# Patient Record
Sex: Female | Born: 1985 | Race: Black or African American | Hispanic: No | Smoking: Current every day smoker
Health system: Southern US, Community
[De-identification: ages and names within clinical notes are randomized; demographics above are authoritative.]

## PROBLEM LIST (undated history)

## (undated) DIAGNOSIS — N390 Urinary tract infection, site not specified: Secondary | ICD-10-CM

## (undated) DIAGNOSIS — G40909 Epilepsy, unspecified, not intractable, without status epilepticus: Secondary | ICD-10-CM

## (undated) DIAGNOSIS — F1410 Cocaine abuse, uncomplicated: Secondary | ICD-10-CM

## (undated) DIAGNOSIS — F329 Major depressive disorder, single episode, unspecified: Secondary | ICD-10-CM

## (undated) DIAGNOSIS — F32A Depression, unspecified: Secondary | ICD-10-CM

## (undated) HISTORY — PX: HERNIA REPAIR: SHX51

## (undated) HISTORY — DX: Epilepsy, unspecified, not intractable, without status epilepticus: G40.909

---

## 2012-09-06 LAB — CYSTIC FIBROSIS DIAGNOSTIC STUDY: Interpretation-CFDNA:: NEGATIVE

## 2012-09-06 LAB — OB RESULTS CONSOLE VARICELLA ZOSTER ANTIBODY, IGG: Varicella: IMMUNE

## 2012-09-06 LAB — OB RESULTS CONSOLE RUBELLA ANTIBODY, IGM: Rubella: IMMUNE

## 2012-09-06 LAB — OB RESULTS CONSOLE RPR: RPR: NONREACTIVE

## 2012-09-27 ENCOUNTER — Other Ambulatory Visit (HOSPITAL_COMMUNITY)
Admission: RE | Admit: 2012-09-27 | Discharge: 2012-09-27 | Disposition: A | Discharge status: Home / Self Care | Payer: Medicaid Other | Source: Ambulatory Visit | Attending: Obstetrics and Gynecology | Admitting: Obstetrics and Gynecology

## 2012-09-27 DIAGNOSIS — Z113 Encounter for screening for infections with a predominantly sexual mode of transmission: Secondary | ICD-10-CM | POA: Insufficient documentation

## 2012-09-27 DIAGNOSIS — Z01419 Encounter for gynecological examination (general) (routine) without abnormal findings: Secondary | ICD-10-CM | POA: Insufficient documentation

## 2012-09-27 LAB — OB RESULTS CONSOLE GC/CHLAMYDIA
Chlamydia: NEGATIVE
Gonorrhea: NEGATIVE

## 2012-11-24 ENCOUNTER — Encounter: Payer: Self-pay | Admitting: *Deleted

## 2012-11-24 DIAGNOSIS — R569 Unspecified convulsions: Secondary | ICD-10-CM

## 2013-06-03 ENCOUNTER — Encounter (HOSPITAL_COMMUNITY): Payer: Self-pay | Admitting: Emergency Medicine

## 2013-06-03 ENCOUNTER — Emergency Department (HOSPITAL_COMMUNITY)
Admission: EM | Admit: 2013-06-03 | Discharge: 2013-06-04 | Disposition: A | Discharge status: Home / Self Care | Payer: Medicaid Other | Attending: Emergency Medicine | Admitting: Emergency Medicine

## 2013-06-03 DIAGNOSIS — Z3202 Encounter for pregnancy test, result negative: Secondary | ICD-10-CM | POA: Insufficient documentation

## 2013-06-03 DIAGNOSIS — N390 Urinary tract infection, site not specified: Secondary | ICD-10-CM

## 2013-06-03 DIAGNOSIS — Z79899 Other long term (current) drug therapy: Secondary | ICD-10-CM | POA: Insufficient documentation

## 2013-06-03 DIAGNOSIS — Z880 Allergy status to penicillin: Secondary | ICD-10-CM | POA: Insufficient documentation

## 2013-06-03 DIAGNOSIS — M545 Low back pain, unspecified: Secondary | ICD-10-CM | POA: Insufficient documentation

## 2013-06-03 DIAGNOSIS — Z87891 Personal history of nicotine dependence: Secondary | ICD-10-CM | POA: Insufficient documentation

## 2013-06-03 DIAGNOSIS — R112 Nausea with vomiting, unspecified: Secondary | ICD-10-CM

## 2013-06-03 DIAGNOSIS — G40909 Epilepsy, unspecified, not intractable, without status epilepticus: Secondary | ICD-10-CM | POA: Insufficient documentation

## 2013-06-03 LAB — HEPATIC FUNCTION PANEL
ALT: 26 U/L (ref 0–35)
AST: 18 U/L (ref 0–37)
Albumin: 3.7 g/dL (ref 3.5–5.2)
Total Bilirubin: 0.3 mg/dL (ref 0.3–1.2)
Total Protein: 8.1 g/dL (ref 6.0–8.3)

## 2013-06-03 LAB — CBC
HCT: 38.1 % (ref 36.0–46.0)
Hemoglobin: 12.4 g/dL (ref 12.0–15.0)
WBC: 8.8 10*3/uL (ref 4.0–10.5)

## 2013-06-03 LAB — URINE MICROSCOPIC-ADD ON

## 2013-06-03 LAB — URINALYSIS, ROUTINE W REFLEX MICROSCOPIC
Glucose, UA: NEGATIVE mg/dL
Specific Gravity, Urine: >1.03 — ABNORMAL HIGH (ref 1.005–1.030)
Urobilinogen, UA: 0.2 mg/dL (ref 0.0–1.0)

## 2013-06-03 LAB — POCT PREGNANCY, URINE: Preg Test, Ur: NEGATIVE

## 2013-06-03 LAB — BASIC METABOLIC PANEL
Chloride: 104 mEq/L (ref 96–112)
GFR calc Af Amer: 87 mL/min — ABNORMAL LOW (ref >90)
Potassium: 4 mEq/L (ref 3.5–5.1)

## 2013-06-03 MED ORDER — MORPHINE SULFATE 4 MG/ML IJ SOLN
4.0000 mg | Freq: Once | INTRAMUSCULAR | Status: AC
  Administered 2013-06-03: 4 mg via INTRAVENOUS
  Filled 2013-06-03: qty 1

## 2013-06-03 MED ORDER — SODIUM CHLORIDE 0.9 % IV BOLUS (SEPSIS)
1000.0000 mL | Freq: Once | INTRAVENOUS | Status: DC
Start: 2013-06-03 — End: 2013-06-04

## 2013-06-03 MED ORDER — SODIUM CHLORIDE 0.9 % IV BOLUS (SEPSIS)
1000.0000 mL | Freq: Once | INTRAVENOUS | Status: AC
  Administered 2013-06-03: 1000 mL via INTRAVENOUS

## 2013-06-03 MED ORDER — ONDANSETRON HCL 4 MG/2ML IJ SOLN
4.0000 mg | Freq: Once | INTRAMUSCULAR | Status: AC
  Administered 2013-06-03: 4 mg via INTRAVENOUS
  Filled 2013-06-03: qty 2

## 2013-06-03 NOTE — ED Notes (Signed)
Advised patient that we needed a urine sample. Patient states "I went before I got back here." Patient refuses to try again for a sample.

## 2013-06-03 NOTE — ED Notes (Signed)
Patient ambulated to bathroom to obtain urine sample. Patient came out stating "I forgot you wanted a sample so I forgot to put the cup down there. I will try again in a few minutes." MD aware.

## 2013-06-03 NOTE — ED Notes (Signed)
Pt states lower back pain. Vomiting all day, states "I wasn't counting. I'm sure I am dehydrated.

## 2013-06-03 NOTE — ED Provider Notes (Signed)
CSN: [id number]     Arrival date & time 06/03/13  1625 History   First MD Initiated Contact with Patient 06/03/13 1921    Scribed for No att. providers found, the patient was seen in room APA12/APA12. This chart was scribed by Lewanda Rife, ED scribe. Patient's care was started at 11:39 PM  Chief Complaint  Patient presents with  . Back Pain  . Emesis   (Consider location/radiation/quality/duration/timing/severity/associated sxs/prior Treatment) The history is provided by the patient. No language interpreter was used.   HPI Comments: Lamika Connolly is a 27 y.o. female who presents to the Emergency Department complaining of several episodes emesis onset this morning. Reports associated low back pain, nausea, and upper abdominal pain. Describes pain as constant and moderate in severity. Denies any aggravating or alleviating factors. Denies associated fever, dysuria, diarrhea, vaginal discharge, and vaginal bleeding. Reports unsure if pregnant. Denies sick contacts. Past Medical History  Diagnosis Date  . Epilepsy    Past Surgical History  Procedure Laterality Date  . Hernia repair      umbilical hernia repair x 2.   Family History  Problem Relation Age of Onset  . Hypertension Other   . Diabetes Other   . Epilepsy Other    History  Substance Use Topics  . Smoking status: Former Smoker    Types: Cigarettes  . Smokeless tobacco: Not on file  . Alcohol Use: Yes     Comment: occ. none since +pregnancy test.   OB History   Grav Para Term Preterm Abortions TAB SAB Ect Mult Living   3 2 2             Review of Systems  Constitutional: Negative for fever.  Gastrointestinal: Positive for nausea, vomiting and abdominal pain. Negative for diarrhea.  Musculoskeletal: Positive for back pain.  All other systems reviewed and are negative.  A complete 10 system review of systems was obtained and all systems are negative except as noted in the HPI and PMHx.     Allergies   Penicillins  Home Medications   Current Outpatient Rx  Name  Route  Sig  Dispense  Refill  . levETIRAcetam (KEPPRA) 500 MG tablet   Oral   Take 500 mg by mouth 2 (two) times daily.         Marland Kitchen oxyCODONE-acetaminophen (PERCOCET/ROXICET) 5-325 MG per tablet   Oral   Take 1-2 tablets by mouth every 6 (six) hours as needed for pain.   10 tablet   0   . promethazine (PHENERGAN) 25 MG tablet   Oral   Take 1 tablet (25 mg total) by mouth every 6 (six) hours as needed for nausea.   10 tablet   0   . sulfamethoxazole-trimethoprim (SEPTRA DS) 800-160 MG per tablet   Oral   Take 1 tablet by mouth 2 (two) times daily.   6 tablet   0    BP 110/63  Pulse 65  Temp(Src) 98.1 F (36.7 C) (Oral)  Resp 16  Ht 5\' 11"  (1.803 m)  Wt 274 lb (124.286 kg)  BMI 38.23 kg/m2  SpO2 98%  LMP 06/03/2013  Breastfeeding? Unknown Physical Exam  Nursing note and vitals reviewed. Constitutional: She is oriented to person, place, and time. She appears well-developed and well-nourished. No distress.  Obese   HENT:  Head: Normocephalic and atraumatic.  Eyes: Conjunctivae and EOM are normal.  Neck: Neck supple. No tracheal deviation present.  Cardiovascular: Normal rate and regular rhythm.   No murmur heard. Pulmonary/Chest:  Effort normal and breath sounds normal. No respiratory distress.  Abdominal: Soft. There is tenderness.  TTP Upper abdomen No CVA tenderness   Musculoskeletal: Normal range of motion.  Neurological: She is alert and oriented to person, place, and time.  Skin: Skin is warm and dry.  Psychiatric: She has a normal mood and affect. Her behavior is normal.    ED Course  Procedures (including critical care time)  COORDINATION OF CARE:  Nursing notes reviewed. Vital signs reviewed. Initial pt interview and examination performed.   11:39 PM-Discussed work up plan with pt at bedside, which includes BMP, CBC, UA, and POCT pregnancy  . Pt agrees with plan.   Treatment plan  initiated: Medications  ondansetron [facility]) injection 4 mg (4 mg Intravenous Given 06/03/13 1958)  sodium chloride 0.9 % bolus 1,000 mL (0 mLs Intravenous Stopped 06/04/13 0050)  ondansetron (ZOFRAN) injection 4 mg (4 mg Intravenous Given 06/03/13 2141)  morphine 4 MG/ML injection 4 mg (4 mg Intravenous Given 06/03/13 2141)     Initial diagnostic testing ordered.    Labs Review Labs Reviewed  BASIC METABOLIC PANEL - Abnormal; Notable for the following:    Glucose, Bld 125 (*)    GFR calc non Af Amer 75 (*)    GFR calc Af Amer 87 (*)    All other components within normal limits  URINALYSIS, ROUTINE W REFLEX MICROSCOPIC - Abnormal; Notable for the following:    Specific Gravity, Urine >1.030 (*)    Hgb urine dipstick LARGE (*)    Ketones, ur 15 (*)    All other components within normal limits  HEPATIC FUNCTION PANEL - Abnormal; Notable for the following:    Indirect Bilirubin 0.2 (*)    All other components within normal limits  URINE MICROSCOPIC-ADD ON - Abnormal; Notable for the following:    Squamous Epithelial / LPF MANY (*)    Bacteria, UA MANY (*)    All other components within normal limits  URINE CULTURE  CBC  LIPASE, BLOOD  POCT PREGNANCY, URINE   Imaging Review No results found.  EKG Interpretation   None       MDM   1. UTI (urinary tract infection)   2. Nausea and vomiting    Patient with nausea vomiting abdominal pain. Has UTI. Patient feels somewhat better after treatment. Has tolerated orals he'll be discharged home  I personally performed the services described in this documentation, which was scribed in my presence. The recorded information has been reviewed and is accurate.      Juliet Rude. Rubin Payor, MD 06/04/13 2340

## 2013-06-04 MED ORDER — OXYCODONE-ACETAMINOPHEN 5-325 MG PO TABS: 1.0000 | ORAL_TABLET | Freq: Four times a day (QID) | ORAL | Status: DC | PRN

## 2013-06-04 MED ORDER — SULFAMETHOXAZOLE-TRIMETHOPRIM 800-160 MG PO TABS: 1.0000 | ORAL_TABLET | Freq: Two times a day (BID) | ORAL | Status: DC

## 2013-06-04 MED ORDER — PROMETHAZINE HCL 25 MG PO TABS: 25.0000 mg | ORAL_TABLET | Freq: Four times a day (QID) | ORAL | Status: DC | PRN

## 2013-06-05 LAB — URINE CULTURE: Colony Count: NO GROWTH

## 2014-01-09 ENCOUNTER — Encounter (HOSPITAL_COMMUNITY): Payer: Self-pay | Admitting: Emergency Medicine

## 2014-01-09 ENCOUNTER — Emergency Department (HOSPITAL_COMMUNITY)
Admission: EM | Admit: 2014-01-09 | Discharge: 2014-01-09 | Disposition: A | Discharge status: Home / Self Care | Payer: Medicaid Other | Attending: Emergency Medicine | Admitting: Emergency Medicine

## 2014-01-09 DIAGNOSIS — R350 Frequency of micturition: Secondary | ICD-10-CM | POA: Insufficient documentation

## 2014-01-09 DIAGNOSIS — R300 Dysuria: Secondary | ICD-10-CM | POA: Insufficient documentation

## 2014-01-09 DIAGNOSIS — F3289 Other specified depressive episodes: Secondary | ICD-10-CM | POA: Insufficient documentation

## 2014-01-09 DIAGNOSIS — G40909 Epilepsy, unspecified, not intractable, without status epilepticus: Secondary | ICD-10-CM | POA: Insufficient documentation

## 2014-01-09 DIAGNOSIS — R109 Unspecified abdominal pain: Secondary | ICD-10-CM | POA: Insufficient documentation

## 2014-01-09 DIAGNOSIS — Z3202 Encounter for pregnancy test, result negative: Secondary | ICD-10-CM | POA: Insufficient documentation

## 2014-01-09 DIAGNOSIS — Z87891 Personal history of nicotine dependence: Secondary | ICD-10-CM | POA: Insufficient documentation

## 2014-01-09 DIAGNOSIS — M545 Low back pain, unspecified: Secondary | ICD-10-CM | POA: Insufficient documentation

## 2014-01-09 DIAGNOSIS — F329 Major depressive disorder, single episode, unspecified: Secondary | ICD-10-CM | POA: Insufficient documentation

## 2014-01-09 DIAGNOSIS — Z79899 Other long term (current) drug therapy: Secondary | ICD-10-CM | POA: Insufficient documentation

## 2014-01-09 DIAGNOSIS — Z9889 Other specified postprocedural states: Secondary | ICD-10-CM | POA: Insufficient documentation

## 2014-01-09 DIAGNOSIS — Z880 Allergy status to penicillin: Secondary | ICD-10-CM | POA: Insufficient documentation

## 2014-01-09 HISTORY — DX: Depression, unspecified: F32.A

## 2014-01-09 HISTORY — DX: Major depressive disorder, single episode, unspecified: F32.9

## 2014-01-09 LAB — COMPREHENSIVE METABOLIC PANEL
ALT: 8 U/L (ref 0–35)
AST: 11 U/L (ref 0–37)
Albumin: 3.4 g/dL — ABNORMAL LOW (ref 3.5–5.2)
Alkaline Phosphatase: 70 U/L (ref 39–117)
BILIRUBIN TOTAL: 0.4 mg/dL (ref 0.3–1.2)
BUN: 5 mg/dL — ABNORMAL LOW (ref 6–23)
CHLORIDE: 106 meq/L (ref 96–112)
CO2: 24 meq/L (ref 19–32)
CREATININE: 0.94 mg/dL (ref 0.50–1.10)
Calcium: 9 mg/dL (ref 8.4–10.5)
GFR, EST NON AFRICAN AMERICAN: 82 mL/min — AB (ref >90)
GLUCOSE: 114 mg/dL — AB (ref 70–99)
Potassium: 3.7 mEq/L (ref 3.7–5.3)
Sodium: 141 mEq/L (ref 137–147)
Total Protein: 7.1 g/dL (ref 6.0–8.3)

## 2014-01-09 LAB — URINALYSIS, ROUTINE W REFLEX MICROSCOPIC
Glucose, UA: NEGATIVE mg/dL
HGB URINE DIPSTICK: NEGATIVE
KETONES UR: 15 mg/dL — AB
Nitrite: NEGATIVE
PROTEIN: NEGATIVE mg/dL
Specific Gravity, Urine: 1.025 (ref 1.005–1.030)
Urobilinogen, UA: 2 mg/dL — ABNORMAL HIGH (ref 0.0–1.0)
pH: 6.5 (ref 5.0–8.0)

## 2014-01-09 LAB — POC URINE PREG, ED: Preg Test, Ur: NEGATIVE

## 2014-01-09 LAB — URINE MICROSCOPIC-ADD ON

## 2014-01-09 LAB — CBC WITH DIFFERENTIAL/PLATELET
BASOS ABS: 0 10*3/uL (ref 0.0–0.1)
Basophils Relative: 1 % (ref 0–1)
Eosinophils Absolute: 0.1 10*3/uL (ref 0.0–0.7)
Eosinophils Relative: 2 % (ref 0–5)
HEMATOCRIT: 36.5 % (ref 36.0–46.0)
HEMOGLOBIN: 11.9 g/dL — AB (ref 12.0–15.0)
LYMPHS PCT: 30 % (ref 12–46)
Lymphs Abs: 1.8 10*3/uL (ref 0.7–4.0)
MCH: 28.9 pg (ref 26.0–34.0)
MCHC: 32.6 g/dL (ref 30.0–36.0)
MCV: 88.6 fL (ref 78.0–100.0)
MONO ABS: 0.2 10*3/uL (ref 0.1–1.0)
Monocytes Relative: 3 % (ref 3–12)
NEUTROS ABS: 4 10*3/uL (ref 1.7–7.7)
Neutrophils Relative %: 66 % (ref 43–77)
Platelets: 211 10*3/uL (ref 150–400)
RBC: 4.12 MIL/uL (ref 3.87–5.11)
RDW: 13.1 % (ref 11.5–15.5)
WBC: 6 10*3/uL (ref 4.0–10.5)

## 2014-01-09 LAB — HCG, SERUM, QUALITATIVE: Preg, Serum: NEGATIVE

## 2014-01-09 MED ORDER — SULFAMETHOXAZOLE-TRIMETHOPRIM 800-160 MG PO TABS: 1.0000 | ORAL_TABLET | Freq: Two times a day (BID) | ORAL | Status: DC

## 2014-01-09 MED ORDER — PHENAZOPYRIDINE HCL 200 MG PO TABS: 200.0000 mg | ORAL_TABLET | Freq: Three times a day (TID) | ORAL | Status: DC

## 2014-01-09 NOTE — Discharge Instructions (Signed)
Dysuria Dysuria is the medical term for pain with urination. There are many causes for dysuria, but urinary tract infection is the most common. If a urinalysis was performed it can show that there is a urinary tract infection. A urine culture confirms that you or your child is sick. You will need to follow up with a healthcare provider because:  If a urine culture was done you will need to know the culture results and treatment recommendations.  If the urine culture was positive, you or your child will need to be put on antibiotics or know if the antibiotics prescribed are the right antibiotics for your urinary tract infection.  If the urine culture is negative (no urinary tract infection), then other causes may need to be explored or antibiotics need to be stopped. Today laboratory work may have been done and there does not seem to be an infection. If cultures were done they will take at least 24 to 48 hours to be completed. Today x-rays may have been taken and they read as normal. No cause can be found for the problems. The x-rays may be re-read by a radiologist and you will be contacted if additional findings are made. You or your child may have been put on medications to help with this problem until you can see your primary caregiver. If the problems get better, see your primary caregiver if the problems return. If you were given antibiotics (medications which kill germs), take all of the mediations as directed for the full course of treatment.  If laboratory work was done, you need to find the results. Leave a telephone number where you can be reached. If this is not possible, make sure you find out how you are to get test results. HOME CARE INSTRUCTIONS   Drink lots of fluids. For adults, drink eight, 8 ounce glasses of clear juice or water a day. For children, replace fluids as suggested by your caregiver.  Empty the bladder often. Avoid holding urine for long periods of time.  After a bowel  movement, women should cleanse front to back, using each tissue only once.  Empty your bladder before and after sexual intercourse.  Take all the medicine given to you until it is gone. You may feel better in a few days, but TAKE ALL MEDICINE.  Avoid caffeine, tea, alcohol and carbonated beverages, because they tend to irritate the bladder.  In men, alcohol may irritate the prostate.  Only take over-the-counter or prescription medicines for pain, discomfort, or fever as directed by your caregiver.  If your caregiver has given you a follow-up appointment, it is very important to keep that appointment. Not keeping the appointment could result in a chronic or permanent injury, pain, and disability. If there is any problem keeping the appointment, you must call back to this facility for assistance. SEEK IMMEDIATE MEDICAL CARE IF:   Back pain develops.  A fever develops.  There is nausea (feeling sick to your stomach) or vomiting (throwing up).  Problems are no better with medications or are getting worse. MAKE SURE YOU:   Understand these instructions.  Will watch your condition.  Will get help right away if you are not doing well or get worse. Document Released: 05/08/2004 Document Revised: 11/02/2011 Document Reviewed: 03/15/2008 [facility] Patient Information 2014 [place], [place].   Your lab tests are negative today for urinary infection and you are not pregnant.  See the resource guide above for locating a primary doctor.  You may take the pyridium  prescription for dysuria.  Your urine sample has been sent for culture as there is not clear infection in your bladder.  However,  I will cover you with antibiotics given your symptoms.  Make sure you are drinking plenty of fluids.

## 2014-01-09 NOTE — ED Provider Notes (Signed)
CSN: [id number]     Arrival date & time 01/09/14  1331 History   First MD Initiated Contact with Patient 01/09/14 1438     Chief Complaint  Patient presents with  . Possible Pregnancy     (Consider location/radiation/quality/duration/timing/severity/associated sxs/prior Treatment) HPI Comments: Chelsey Sullivan is a 28 y.o. Female who is currently 10 months post partum presenting with complaint of lower back and suprapubic pain along with increased urinary frequency.  She took a home pregnancy test x 2 last night, both being positive. Her LMP was 12/22/13 and normal.  She denies breast tenderness, nausea, vomiting, fevers, diarrhea and also denies painful urination.  She is not currently breast feeding.  She denies vaginal discharge and is not currently using birth control.       The history is provided by the patient.    Past Medical History  Diagnosis Date  . Epilepsy   . Depression    Past Surgical History  Procedure Laterality Date  . Hernia repair      umbilical hernia repair x 2.   Family History  Problem Relation Age of Onset  . Hypertension Other   . Diabetes Other   . Epilepsy Other    History  Substance Use Topics  . Smoking status: Former Smoker    Types: Cigarettes  . Smokeless tobacco: Not on file  . Alcohol Use: Yes     Comment: occ. none since +pregnancy test.   OB History   Grav Para Term Preterm Abortions TAB SAB Ect Mult Living   3 2 2             Review of Systems  Constitutional: Negative for fever and chills.  HENT: Negative for congestion and sore throat.   Eyes: Negative.   Respiratory: Negative for chest tightness and shortness of breath.   Cardiovascular: Negative for chest pain.  Gastrointestinal: Negative for nausea, vomiting, abdominal pain, diarrhea and rectal pain.  Genitourinary: Positive for frequency. Negative for dysuria, hematuria and vaginal discharge.  Musculoskeletal: Positive for back pain. Negative for arthralgias, joint swelling  and neck pain.  Skin: Negative.  Negative for rash and wound.  Neurological: Negative for dizziness, weakness, light-headedness, numbness and headaches.  Psychiatric/Behavioral: Negative.       Allergies  Amoxicillin and Penicillins  Home Medications   Prior to Admission medications   Medication Sig Start Date End Date Taking? Authorizing Provider  citalopram (CELEXA) 20 MG tablet Take 20 mg by mouth daily.   Yes Historical Provider, MD  levETIRAcetam (KEPPRA) 500 MG tablet Take 500 mg by mouth 2 (two) times daily.   Yes Historical Provider, MD  ranitidine (ZANTAC) 150 MG capsule Take 150 mg by mouth daily.   Yes Historical Provider, MD   BP 114/68  Pulse 62  Temp(Src) 97 F (36.1 C) (Oral)  Resp 16  Wt 280 lb (127.007 kg)  SpO2 100%  LMP 12/22/2013 Physical Exam  Nursing note and vitals reviewed. Constitutional: She appears well-developed and well-nourished. No distress.  HENT:  Head: Normocephalic and atraumatic.  Eyes: Conjunctivae are normal.  Neck: Normal range of motion.  Cardiovascular: Normal rate, regular rhythm, normal heart sounds and intact distal pulses.   Pulmonary/Chest: Effort normal and breath sounds normal. She has no wheezes.  Abdominal: Soft. Bowel sounds are normal. She exhibits no mass. There is no hepatosplenomegaly. There is no tenderness. There is no rigidity, no rebound, no guarding and no CVA tenderness.  Obese abdomen.  Musculoskeletal: Normal range of motion.  Lumbar back: She exhibits tenderness. She exhibits no bony tenderness.  ttp bilateral lower back.  Neurological: She is alert.  Skin: Skin is warm and dry. She is not diaphoretic.  Psychiatric: Her affect is blunt.    ED Course  Procedures (including critical care time) Labs Review Labs Reviewed  CBC WITH DIFFERENTIAL - Abnormal; Notable for the following:    Hemoglobin 11.9 (*)    All other components within normal limits  COMPREHENSIVE METABOLIC PANEL - Abnormal; Notable  for the following:    Glucose, Bld 114 (*)    BUN 5 (*)    Albumin 3.4 (*)    GFR calc non Af Amer 82 (*)    All other components within normal limits  URINALYSIS, ROUTINE W REFLEX MICROSCOPIC - Abnormal; Notable for the following:    Color, Urine AMBER (*)    APPearance CLOUDY (*)    Bilirubin Urine SMALL (*)    Ketones, ur 15 (*)    Urobilinogen, UA 2.0 (*)    Leukocytes, UA SMALL (*)    All other components within normal limits  URINE MICROSCOPIC-ADD ON - Abnormal; Notable for the following:    Squamous Epithelial / LPF MANY (*)    Bacteria, UA MANY (*)    All other components within normal limits  HCG, SERUM, QUALITATIVE  POC URINE PREG, ED    Imaging Review No results found.   EKG Interpretation None      MDM   Final diagnoses:  Dysuria    Patients labs and/or radiological studies were viewed and considered during the medical decision making and disposition process. Pt with dysuria with bacturia.  Not clean catch,  But with sx,  Will tx.  Urine cx sent.  Bactrim,  Pyridium,  Fluids.  Return here for worsened sx, fever, pain, vomiting, etc.    The patient appears reasonably screened and/or stabilized for discharge and I doubt any other medical condition or other [facility] requiring further screening, evaluation, or treatment in the ED at this time prior to discharge.     Burgess AmorJulie Dimitrios Balestrieri, PA-C 01/09/14 1824

## 2014-01-09 NOTE — ED Notes (Signed)
Pt reports she took a home pregnancy test twice and it was positive, sts she is here for another pregnancy test because she wants to make sure it is still positive. Denies seeking pre-natal care/seeing an OB. Has been pregnant 3 times before this. Pt reports chronic back pain, sts over the past 2 weeks it has flared up and been hurting. Denies abd pain/n/v/d. Nad, skin warm and dry, resp e/u.

## 2014-01-09 NOTE — ED Notes (Signed)
She took 2 positive pregnancy tests at home. She states her stomach and back have been hurting. She wants to make sure shes really pregnant

## 2014-01-11 NOTE — ED Provider Notes (Signed)
Medical screening examination/treatment/procedure(s) were performed by non-physician practitioner and as supervising physician I was immediately available for consultation/collaboration.      Byren Pankow M Milanya Sunderland, DO 01/11/14 2138 

## 2014-03-01 ENCOUNTER — Emergency Department (HOSPITAL_COMMUNITY)
Admission: EM | Admit: 2014-03-01 | Discharge: 2014-03-01 | Disposition: A | Discharge status: Home / Self Care | Payer: Medicaid Other | Attending: Emergency Medicine | Admitting: Emergency Medicine

## 2014-03-01 ENCOUNTER — Encounter (HOSPITAL_COMMUNITY): Payer: Self-pay | Admitting: Emergency Medicine

## 2014-03-01 DIAGNOSIS — Z880 Allergy status to penicillin: Secondary | ICD-10-CM | POA: Diagnosis not present

## 2014-03-01 DIAGNOSIS — F329 Major depressive disorder, single episode, unspecified: Secondary | ICD-10-CM | POA: Diagnosis not present

## 2014-03-01 DIAGNOSIS — M79609 Pain in unspecified limb: Secondary | ICD-10-CM | POA: Insufficient documentation

## 2014-03-01 DIAGNOSIS — Z87891 Personal history of nicotine dependence: Secondary | ICD-10-CM | POA: Diagnosis not present

## 2014-03-01 DIAGNOSIS — M79604 Pain in right leg: Secondary | ICD-10-CM

## 2014-03-01 DIAGNOSIS — G40909 Epilepsy, unspecified, not intractable, without status epilepticus: Secondary | ICD-10-CM | POA: Diagnosis not present

## 2014-03-01 DIAGNOSIS — Z87828 Personal history of other (healed) physical injury and trauma: Secondary | ICD-10-CM | POA: Diagnosis not present

## 2014-03-01 DIAGNOSIS — Z79899 Other long term (current) drug therapy: Secondary | ICD-10-CM | POA: Insufficient documentation

## 2014-03-01 DIAGNOSIS — F3289 Other specified depressive episodes: Secondary | ICD-10-CM | POA: Diagnosis not present

## 2014-03-01 DIAGNOSIS — Z792 Long term (current) use of antibiotics: Secondary | ICD-10-CM | POA: Diagnosis not present

## 2014-03-01 MED ORDER — TRAMADOL HCL 50 MG PO TABS
50.0000 mg | ORAL_TABLET | Freq: Once | ORAL | Status: AC
  Administered 2014-03-01: 50 mg via ORAL
  Filled 2014-03-01: qty 1

## 2014-03-01 MED ORDER — TRAMADOL HCL 50 MG PO TABS: 50.0000 mg | ORAL_TABLET | Freq: Four times a day (QID) | ORAL | Status: DC | PRN

## 2014-03-01 NOTE — Discharge Instructions (Signed)

## 2014-03-01 NOTE — ED Notes (Signed)
Patient discharged with all personal belongings. 
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
## 2014-03-06 NOTE — ED Provider Notes (Signed)
Medical screening examination/treatment/procedure(s) were performed by non-physician practitioner and as supervising physician I was immediately available for consultation/collaboration.   EKG Interpretation None      Devoria AlbeIva Intisar Claudio, MD, Armando GangFACEP   Ward GivensIva L Trayveon Beckford, MD 03/06/14 [phone]

## 2014-06-25 ENCOUNTER — Encounter (HOSPITAL_COMMUNITY): Payer: Self-pay | Admitting: Emergency Medicine

## 2014-08-15 ENCOUNTER — Encounter (HOSPITAL_COMMUNITY): Payer: Self-pay | Admitting: *Deleted

## 2014-08-15 ENCOUNTER — Emergency Department (HOSPITAL_COMMUNITY): Payer: Medicaid Other

## 2014-08-15 ENCOUNTER — Emergency Department (HOSPITAL_COMMUNITY)
Admission: EM | Admit: 2014-08-15 | Discharge: 2014-08-16 | Disposition: A | Discharge status: Home / Self Care | Payer: Medicaid Other | Attending: Emergency Medicine | Admitting: Emergency Medicine

## 2014-08-15 DIAGNOSIS — O9989 Other specified diseases and conditions complicating pregnancy, childbirth and the puerperium: Secondary | ICD-10-CM | POA: Diagnosis present

## 2014-08-15 DIAGNOSIS — O99341 Other mental disorders complicating pregnancy, first trimester: Secondary | ICD-10-CM | POA: Diagnosis not present

## 2014-08-15 DIAGNOSIS — Z880 Allergy status to penicillin: Secondary | ICD-10-CM | POA: Insufficient documentation

## 2014-08-15 DIAGNOSIS — Z79899 Other long term (current) drug therapy: Secondary | ICD-10-CM | POA: Diagnosis not present

## 2014-08-15 DIAGNOSIS — O210 Mild hyperemesis gravidarum: Secondary | ICD-10-CM | POA: Insufficient documentation

## 2014-08-15 DIAGNOSIS — N390 Urinary tract infection, site not specified: Secondary | ICD-10-CM

## 2014-08-15 DIAGNOSIS — O99351 Diseases of the nervous system complicating pregnancy, first trimester: Secondary | ICD-10-CM | POA: Diagnosis not present

## 2014-08-15 DIAGNOSIS — O2341 Unspecified infection of urinary tract in pregnancy, first trimester: Secondary | ICD-10-CM | POA: Insufficient documentation

## 2014-08-15 DIAGNOSIS — R109 Unspecified abdominal pain: Secondary | ICD-10-CM

## 2014-08-15 DIAGNOSIS — F329 Major depressive disorder, single episode, unspecified: Secondary | ICD-10-CM | POA: Diagnosis not present

## 2014-08-15 DIAGNOSIS — Z3A01 Less than 8 weeks gestation of pregnancy: Secondary | ICD-10-CM | POA: Insufficient documentation

## 2014-08-15 DIAGNOSIS — G40909 Epilepsy, unspecified, not intractable, without status epilepticus: Secondary | ICD-10-CM | POA: Insufficient documentation

## 2014-08-15 DIAGNOSIS — Z3490 Encounter for supervision of normal pregnancy, unspecified, unspecified trimester: Secondary | ICD-10-CM

## 2014-08-15 DIAGNOSIS — Z87891 Personal history of nicotine dependence: Secondary | ICD-10-CM | POA: Insufficient documentation

## 2014-08-15 LAB — I-STAT BETA HCG BLOOD, ED (MC, WL, AP ONLY): I-stat hCG, quantitative: >2000 m[IU]/mL — ABNORMAL HIGH (ref <5)

## 2014-08-15 LAB — URINALYSIS, ROUTINE W REFLEX MICROSCOPIC
GLUCOSE, UA: NEGATIVE mg/dL
HGB URINE DIPSTICK: NEGATIVE
Ketones, ur: 15 mg/dL — AB
Nitrite: NEGATIVE
Protein, ur: 30 mg/dL — AB
SPECIFIC GRAVITY, URINE: 1.033 — AB (ref 1.005–1.030)
Urobilinogen, UA: 4 mg/dL — ABNORMAL HIGH (ref 0.0–1.0)
pH: 6 (ref 5.0–8.0)

## 2014-08-15 LAB — URINE MICROSCOPIC-ADD ON

## 2014-08-15 LAB — ABO/RH: ABO/RH(D): B POS

## 2014-08-15 LAB — PREGNANCY, URINE: PREG TEST UR: POSITIVE — AB

## 2014-08-15 MED ORDER — ACETAMINOPHEN 325 MG PO TABS
650.0000 mg | ORAL_TABLET | Freq: Once | ORAL | Status: AC
  Administered 2014-08-15: 650 mg via ORAL
  Filled 2014-08-15: qty 2

## 2014-08-15 MED ORDER — CEPHALEXIN 500 MG PO CAPS: 500.0000 mg | ORAL_CAPSULE | Freq: Four times a day (QID) | ORAL | Status: DC

## 2014-08-15 MED ORDER — PRENATAL COMPLETE 14-0.4 MG PO TABS: 1.0000 | ORAL_TABLET | Freq: Every day | ORAL | Status: DC

## 2014-08-15 NOTE — ED Notes (Signed)
Pt is aware urine is needed for testing, pt is unable to urinate at this time, stated she had already used the bathroom.

## 2014-08-15 NOTE — ED Notes (Signed)
Pt in c/o lower back pain and a headache, states she took a home pregnancy test yesterday and it was positive, no distress noted

## 2014-08-15 NOTE — ED Notes (Signed)
Pt states her LMP was 07/06/14

## 2014-08-15 NOTE — ED Provider Notes (Signed)
CSN: [id number]     Arrival date & time 08/15/14  1755 History   First MD Initiated Contact with Patient 08/15/14 1921     Chief Complaint  Patient presents with  . Back Pain     Patient is a 28 y.o. female presenting with back pain. The history is provided by the patient.  Back Pain Location:  Lumbar spine Quality:  Aching Radiates to:  Does not radiate Pain severity:  Mild Onset quality:  Gradual Duration:  1 day Timing:  Intermittent Progression:  Unchanged Chronicity:  New Relieved by:  None tried Worsened by:  Nothing tried Associated symptoms: abdominal pain   Associated symptoms: no fever   pt reports mild low back pain and mild abdominal pain for past day No fever but she report vomiting No vag bleeding/discharge She reports after having nausea/HA yesterday she took a home pregnancy test that was positive LMP one month ago  Past Medical History  Diagnosis Date  . Epilepsy   . Depression    Past Surgical History  Procedure Laterality Date  . Hernia repair      umbilical hernia repair x 2.   Family History  Problem Relation Age of Onset  . Hypertension Other   . Diabetes Other   . Epilepsy Other    History  Substance Use Topics  . Smoking status: Former Smoker    Types: Cigarettes  . Smokeless tobacco: Not on file  . Alcohol Use: Yes     Comment: occ. none since +pregnancy test.   OB History    Gravida Para Term Preterm AB TAB SAB Ectopic Multiple Living   3 2 2             Review of Systems  Constitutional: Negative for fever.  Gastrointestinal: Positive for vomiting and abdominal pain.  Genitourinary: Negative for vaginal bleeding and vaginal discharge.  Musculoskeletal: Positive for back pain.  All other systems reviewed and are negative.     Allergies  Amoxicillin and Penicillins  Home Medications   Prior to Admission medications   Medication Sig Start Date End Date Taking? Authorizing Provider  acetaminophen (TYLENOL) 500 MG tablet  Take 1,000 mg by mouth every 6 (six) hours as needed for headache.   Yes Historical Provider, MD  albuterol (PROVENTIL HFA;VENTOLIN HFA) 108 (90 BASE) MCG/ACT inhaler Inhale 2 puffs into the lungs every 6 (six) hours as needed for wheezing or shortness of breath.   Yes Historical Provider, MD  citalopram (CELEXA) 20 MG tablet Take 20 mg by mouth daily.    Historical Provider, MD  levETIRAcetam (KEPPRA) 500 MG tablet Take 500 mg by mouth 2 (two) times daily.    Historical Provider, MD   BP 113/45 mmHg  Pulse 53  Temp(Src) 98.2 F (36.8 C)  Resp 16  Ht 5\' 6"  (1.676 m)  Wt 210 lb (95.255 kg)  BMI 33.91 kg/m2  SpO2 97% Physical Exam CONSTITUTIONAL: Well developed/well nourished HEAD: Normocephalic/atraumatic EYES: EOMI/PERRL ENMT: Mucous membranes moist NECK: supple no meningeal signs SPINE/BACK:entire spine nontender CV: S1/S2 noted, no murmurs/rubs/gallops noted LUNGS: Lungs are clear to auscultation bilaterally, no apparent distress ABDOMEN: soft, nontender, no rebound or guarding, bowel sounds noted throughout abdomen GU:no cva tenderness, no cmt.  Small amt of whitish discharge.  No vag bleeding.  No adnexal tenderness/mass.  Female chaperone present NEURO: Pt is awake/alert/appropriate, moves all extremitiesx4.  No facial droop.   EXTREMITIES: pulses normal/equal, full ROM SKIN: warm, color normal PSYCH: no abnormalities of mood noted, alert  and oriented to situation  ED Course  Procedures   9:14 PM Pt found to be pregnant She reports back/abd pain She is in no distress US imaging ordered.  (bedside US was limited) She will need treatment for UTI. She needs to f/u with OBGYN concerning her daily medication (keppra/celexa)  11:18 PM US reveals early pregnancy (yolk sac noted) Pt is well appearing, no distress Will f/u as outpatient with OBGYN in one week We discussed strict return precautions Will start keflex for possible UTI (culture ordered) She reports only rash to  PCN no other symptoms will dose with keflex Labs Review Labs Reviewed  URINALYSIS, ROUTINE W REFLEX MICROSCOPIC - Abnormal; Notable for the following:    Color, Urine AMBER (*)    APPearance TURBID (*)    Specific Gravity, Urine 1.033 (*)    Bilirubin Urine SMALL (*)    Ketones, ur 15 (*)    Protein, ur 30 (*)    Urobilinogen, UA 4.0 (*)    Leukocytes, UA MODERATE (*)    All other components within normal limits  PREGNANCY, URINE - Abnormal; Notable for the following:    Preg Test, Ur POSITIVE (*)    All other components within normal limits  URINE MICROSCOPIC-ADD ON - Abnormal; Notable for the following:    Squamous Epithelial / LPF MANY (*)    Bacteria, UA MANY (*)    All other components within normal limits  I-STAT BETA HCG BLOOD, ED ([facility], [facility], [facility] ONLY) - Abnormal; Notable for the following:    I-stat hCG, quantitative >2000.0 (*)    All other components within normal limits  URINE CULTURE  ABO/RH    Imaging Review [place] Ob Comp Less 14 Wks  08/15/2014   CLINICAL DATA:  Abdominal pain. Gestational age by last menstrual period 5 weeks and 5 days.  EXAM: OBSTETRIC <14 WK US AND TRANSVAGINAL OB US  TECHNIQUE: Both transabdominal and transvaginal ultrasound examinations were performed for complete evaluation of the gestation as well as the maternal uterus, adnexal regions, and pelvic cul-de-sac. Transvaginal technique was performed to assess early pregnancy.  COMPARISON:  None.  FINDINGS: Intrauterine gestational sac: Apparent gestational sac, normal in shape.  Yolk sac:  Present  Embryo:  Not present  Cardiac Activity: Not present  MSD:  12.8  mm   6 w   1  d ; EDD April 09, 2015  Maternal uterus/adnexae: No subchorionic hemorrhage. Nonvisualized adnexae, likely obscured by bowel gas. No free fluid.  IMPRESSION: Apparent early intrauterine gestational and yolk sac without fetal pole, or cardiac activity yet visualized. Recommend follow-up quantitative B-HCG levels and follow-up US in 14  days to confirm and assess viability. This recommendation follows SRU consensus guidelines: Diagnostic Criteria for Nonviable Pregnancy Early in the First Trimester. Malva Limes Engl J Med 2013; 045:4098-11; 369:1443-51.   Electronically Signed   By: Awilda Metroourtnay  Bloomer   On: 08/15/2014 22:54   [place] Ob Transvaginal  08/15/2014   CLINICAL DATA:  Abdominal pain. Gestational age by last menstrual period 5 weeks and 5 days.  EXAM: OBSTETRIC <14 WK US AND TRANSVAGINAL OB US  TECHNIQUE: Both transabdominal and transvaginal ultrasound examinations were performed for complete evaluation of the gestation as well as the maternal uterus, adnexal regions, and pelvic cul-de-sac. Transvaginal technique was performed to assess early pregnancy.  COMPARISON:  None.  FINDINGS: Intrauterine gestational sac: Apparent gestational sac, normal in shape.  Yolk sac:  Present  Embryo:  Not present  Cardiac Activity: Not present  MSD:  12.8  mm   6 w   1  d ; EDD April 09, 2015  Maternal uterus/adnexae: No subchorionic hemorrhage. Nonvisualized adnexae, likely obscured by bowel gas. No free fluid.  IMPRESSION: Apparent early intrauterine gestational and yolk sac without fetal pole, or cardiac activity yet visualized. Recommend follow-up quantitative B-HCG levels and follow-up US in 14 days to confirm and assess viability. This recommendation follows SRU consensus guidelines: Diagnostic Criteria for Nonviable Pregnancy Early in the First Trimester. Malva Limes Engl J Med 2013; 960:4540-98; 369:1443-51.   Electronically Signed   By: Awilda Metroourtnay  Bloomer   On: 08/15/2014 22:54      MDM   Final diagnoses:  Abdominal pain  Pregnancy  UTI (lower urinary tract infection)    Nursing notes including past medical history and social history reviewed and considered in documentation Labs/vital reviewed myself and considered during evaluation     Joya Gaskinsonald W Marieta Markov, MD 08/15/14 2320

## 2014-08-17 LAB — URINE CULTURE: Colony Count: >=100000

## 2015-12-09 ENCOUNTER — Emergency Department (HOSPITAL_COMMUNITY)
Admission: EM | Admit: 2015-12-09 | Discharge: 2015-12-09 | Disposition: A | Discharge status: Home / Self Care | Payer: Medicaid Other | Attending: Emergency Medicine | Admitting: Emergency Medicine

## 2015-12-09 ENCOUNTER — Encounter (HOSPITAL_COMMUNITY): Payer: Self-pay | Admitting: Emergency Medicine

## 2015-12-09 DIAGNOSIS — F329 Major depressive disorder, single episode, unspecified: Secondary | ICD-10-CM | POA: Diagnosis not present

## 2015-12-09 DIAGNOSIS — Z79899 Other long term (current) drug therapy: Secondary | ICD-10-CM | POA: Insufficient documentation

## 2015-12-09 DIAGNOSIS — N390 Urinary tract infection, site not specified: Secondary | ICD-10-CM | POA: Insufficient documentation

## 2015-12-09 DIAGNOSIS — M545 Low back pain: Secondary | ICD-10-CM | POA: Diagnosis present

## 2015-12-09 LAB — URINALYSIS, ROUTINE W REFLEX MICROSCOPIC
BILIRUBIN URINE: NEGATIVE
Glucose, UA: NEGATIVE mg/dL
KETONES UR: NEGATIVE mg/dL
NITRITE: NEGATIVE
Protein, ur: 30 mg/dL — AB
Specific Gravity, Urine: 1.025 (ref 1.005–1.030)
pH: 6 (ref 5.0–8.0)

## 2015-12-09 LAB — URINE MICROSCOPIC-ADD ON

## 2015-12-09 LAB — POC URINE PREG, ED: Preg Test, Ur: NEGATIVE

## 2015-12-09 MED ORDER — NAPROXEN 500 MG PO TABS: 500.0000 mg | ORAL_TABLET | Freq: Two times a day (BID) | ORAL | Status: DC

## 2015-12-09 MED ORDER — NITROFURANTOIN MONOHYD MACRO 100 MG PO CAPS: 100.0000 mg | ORAL_CAPSULE | Freq: Two times a day (BID) | ORAL | Status: DC

## 2015-12-09 MED ORDER — NITROFURANTOIN MONOHYD MACRO 100 MG PO CAPS
100.0000 mg | ORAL_CAPSULE | Freq: Two times a day (BID) | ORAL | Status: DC
  Administered 2015-12-09: 100 mg via ORAL
  Filled 2015-12-09: qty 1

## 2015-12-09 MED ORDER — PHENAZOPYRIDINE HCL 200 MG PO TABS: 200.0000 mg | ORAL_TABLET | Freq: Three times a day (TID) | ORAL | Status: DC

## 2015-12-09 NOTE — ED Provider Notes (Signed)
CSN: [id number]     Arrival date & time 12/09/15  1317 History   First MD Initiated Contact with Patient 12/09/15 1436     Chief Complaint  Patient presents with  . Back Pain     (Consider location/radiation/quality/duration/timing/severity/associated sxs/prior Treatment) HPI  Chelsey Sullivan is a 30 y.o. female who presents to the Emergency Department complaining of low back pain for 2 days.  She describes an aching pain to her lower back that is constant and unaffected by movement.  She denies known injury.  She also notes having urinary frequency.  She has taken tylenol without relief.  She denies pain, numbness or weakness of the lower extremities, fever, chills, abdominal pain, urine or bowel changes, vaginal pain or vaginal bleeding.    Past Medical History  Diagnosis Date  . Epilepsy (HCC)   . Depression    Past Surgical History  Procedure Laterality Date  . Hernia repair      umbilical hernia repair x 2.   Family History  Problem Relation Age of Onset  . Hypertension Other   . Diabetes Other   . Epilepsy Other    Social History  Substance Use Topics  . Smoking status: Former Smoker    Types: Cigarettes  . Smokeless tobacco: None  . Alcohol Use: Yes     Comment: occ. none since +pregnancy test.   OB History    Gravida Para Term Preterm AB TAB SAB Ectopic Multiple Living   3 2 2             Review of Systems  Constitutional: Negative for fever.  Respiratory: Negative for shortness of breath.   Gastrointestinal: Negative for vomiting, abdominal pain and constipation.  Genitourinary: Positive for frequency. Negative for dysuria, hematuria, flank pain, decreased urine volume, vaginal bleeding, vaginal discharge and difficulty urinating.  Musculoskeletal: Positive for back pain. Negative for joint swelling.  Skin: Negative for rash.  Neurological: Negative for weakness and numbness.  All other systems reviewed and are negative.     Allergies  Amoxicillin and  Penicillins  Home Medications   Prior to Admission medications   Medication Sig Start Date End Date Taking? Authorizing Provider  acetaminophen (TYLENOL) 500 MG tablet Take 1,000 mg by mouth every 6 (six) hours as needed for headache.   Yes Historical Provider, MD  albuterol (PROVENTIL HFA;VENTOLIN HFA) 108 (90 BASE) MCG/ACT inhaler Inhale 2 puffs into the lungs every 6 (six) hours as needed for wheezing or shortness of breath.   Yes Historical Provider, MD  citalopram (CELEXA) 20 MG tablet Take 20 mg by mouth daily.   Yes Historical Provider, MD  levETIRAcetam (KEPPRA) 500 MG tablet Take 500 mg by mouth 2 (two) times daily.   Yes Historical Provider, MD   BP 108/70 mmHg  Pulse 73  Temp(Src) 98.9 F (37.2 C) (Oral)  Resp 16  Wt 210 lb (95.255 kg)  SpO2 99%  LMP 12/03/2015 Physical Exam  Constitutional: She is oriented to person, place, and time. She appears well-developed and well-nourished. No distress.  HENT:  Head: Normocephalic and atraumatic.  Neck: Normal range of motion. Neck supple.  Cardiovascular: Normal rate, regular rhythm, normal heart sounds and intact distal pulses.   No murmur heard. Pulmonary/Chest: Effort normal and breath sounds normal. No respiratory distress.  Abdominal: Soft. Normal appearance. She exhibits no distension. There is no tenderness. There is no CVA tenderness.  Musculoskeletal: She exhibits tenderness. She exhibits no edema.       Lumbar back: She exhibits  tenderness and pain. She exhibits normal range of motion, no swelling, no deformity, no laceration and normal pulse.  ttp of the right lumbar paraspinal muscles.  No spinal tenderness.  DP pulses are brisk and symmetrical.  Distal sensation intact.  Pt has 5/5 strength against resistance of bilateral lower extremities.     Neurological: She is alert and oriented to person, place, and time. She has normal strength. No sensory deficit. She exhibits normal muscle tone. Coordination and gait normal.   Reflex Scores:      Patellar reflexes are 2+ on the right side and 2+ on the left side.      Achilles reflexes are 2+ on the right side and 2+ on the left side. Skin: Skin is warm and dry. No rash noted.  Nursing note and vitals reviewed.   ED Course  Procedures (including critical care time) Labs Review Labs Reviewed  URINE CULTURE - Abnormal; Notable for the following:    Culture >=100,000 COLONIES/mL GRAM NEGATIVE RODS (*)    All other components within normal limits  URINALYSIS, ROUTINE W REFLEX MICROSCOPIC (NOT AT [facility]) - Abnormal; Notable for the following:    APPearance HAZY (*)    Hgb urine dipstick SMALL (*)    Protein, ur 30 (*)    Leukocytes, UA MODERATE (*)    All other components within normal limits  URINE MICROSCOPIC-ADD ON - Abnormal; Notable for the following:    Squamous Epithelial / LPF 6-30 (*)    Bacteria, UA FEW (*)    All other components within normal limits  POC URINE PREG, ED    Imaging Review No results found. I have personally reviewed and evaluated these images and lab results as part of my medical decision-making.   EKG Interpretation None      MDM   Final diagnoses:  UTI (urinary tract infection), uncomplicated    Pt is well appearing, talking on cell phone and texting during my exam.  Non-toxic appearing.  U/A suggests UTI.  No CVA tenderness, fever vomiting to suggest pyelonephritis.  Urine culture pending.  Rx for macrobid pt advised to return here if sx's worsening.  Appears stable for d/c    Pauline Aus, PA-C 12/11/15 2237  Eber Hong, MD 12/12/15 [phone]

## 2015-12-09 NOTE — ED Notes (Signed)
Having back pain for last 2 days.  Rates pain 8/10.

## 2015-12-09 NOTE — Discharge Instructions (Signed)

## 2015-12-12 LAB — URINE CULTURE

## 2015-12-13 ENCOUNTER — Telehealth: Payer: Self-pay

## 2015-12-13 NOTE — Telephone Encounter (Signed)
Post ED Visit - Positive Culture Follow-up  Culture report reviewed by antimicrobial stewardship pharmacist:  []  Enzo BiNathan Batchelder, Pharm.D. []  Celedonio MiyamotoJeremy Frens, Pharm.D., BCPS []  Garvin FilaMike Maccia, Pharm.D. []  Georgina PillionElizabeth Martin, Pharm.D., BCPS []  AltamontMinh Pham, [address]Pharm[address]D., BCPS, AAHIVP []  Estella HuskMichelle Turner, Pharm.D., BCPS, AAHIVP []  Tennis Mustassie Stewart, Pharm.D. []  Rob Oswaldo DoneVincent, [address]Ph[address] Megan MIlls Pharm D Positive urine culture Treated with Nitrofurantoin Monohyd Macro, organism sensitive to the same and no further patient follow-up is required at this time.  Jerry CarasCullom, Shayde Gervacio Burnett 12/13/2015, 11:04 AM

## 2016-11-02 ENCOUNTER — Observation Stay (HOSPITAL_COMMUNITY)
Admission: AD | Admit: 2016-11-02 | Discharge: 2016-11-04 | Disposition: A | Discharge status: Home / Self Care | Payer: Medicaid Other | Source: Ambulatory Visit | Attending: Obstetrics and Gynecology | Admitting: Obstetrics and Gynecology

## 2016-11-02 DIAGNOSIS — O4400 Placenta previa specified as without hemorrhage, unspecified trimester: Secondary | ICD-10-CM

## 2016-11-02 DIAGNOSIS — G40909 Epilepsy, unspecified, not intractable, without status epilepticus: Secondary | ICD-10-CM

## 2016-11-02 DIAGNOSIS — O99013 Anemia complicating pregnancy, third trimester: Secondary | ICD-10-CM | POA: Insufficient documentation

## 2016-11-02 DIAGNOSIS — D509 Iron deficiency anemia, unspecified: Secondary | ICD-10-CM | POA: Diagnosis present

## 2016-11-02 DIAGNOSIS — Z3A33 33 weeks gestation of pregnancy: Secondary | ICD-10-CM | POA: Insufficient documentation

## 2016-11-02 DIAGNOSIS — O99353 Diseases of the nervous system complicating pregnancy, third trimester: Principal | ICD-10-CM | POA: Insufficient documentation

## 2016-11-02 DIAGNOSIS — R05 Cough: Secondary | ICD-10-CM

## 2016-11-02 DIAGNOSIS — R059 Cough, unspecified: Secondary | ICD-10-CM

## 2016-11-02 DIAGNOSIS — R569 Unspecified convulsions: Secondary | ICD-10-CM

## 2016-11-02 DIAGNOSIS — R51 Headache: Secondary | ICD-10-CM | POA: Insufficient documentation

## 2016-11-02 DIAGNOSIS — R519 Headache, unspecified: Secondary | ICD-10-CM | POA: Diagnosis present

## 2016-11-02 DIAGNOSIS — Z87891 Personal history of nicotine dependence: Secondary | ICD-10-CM | POA: Insufficient documentation

## 2016-11-02 DIAGNOSIS — S0990XA Unspecified injury of head, initial encounter: Secondary | ICD-10-CM

## 2016-11-02 DIAGNOSIS — R531 Weakness: Secondary | ICD-10-CM

## 2016-11-02 DIAGNOSIS — O368390 Maternal care for abnormalities of the fetal heart rate or rhythm, unspecified trimester, not applicable or unspecified: Secondary | ICD-10-CM

## 2016-11-02 DIAGNOSIS — O99810 Abnormal glucose complicating pregnancy: Secondary | ICD-10-CM | POA: Diagnosis present

## 2016-11-02 DIAGNOSIS — R0602 Shortness of breath: Secondary | ICD-10-CM | POA: Diagnosis present

## 2016-11-02 NOTE — MAU Note (Signed)
Pt presents via EMS after a seizure at home. Known seizure disorder.

## 2016-11-03 ENCOUNTER — Inpatient Hospital Stay (HOSPITAL_COMMUNITY): Payer: Medicaid Other

## 2016-11-03 ENCOUNTER — Encounter (HOSPITAL_COMMUNITY): Payer: Self-pay

## 2016-11-03 ENCOUNTER — Encounter: Payer: Self-pay | Admitting: Obstetrics and Gynecology

## 2016-11-03 ENCOUNTER — Observation Stay (HOSPITAL_COMMUNITY): Payer: Medicaid Other

## 2016-11-03 DIAGNOSIS — G40909 Epilepsy, unspecified, not intractable, without status epilepticus: Secondary | ICD-10-CM

## 2016-11-03 DIAGNOSIS — O99810 Abnormal glucose complicating pregnancy: Secondary | ICD-10-CM | POA: Diagnosis present

## 2016-11-03 DIAGNOSIS — O99323 Drug use complicating pregnancy, third trimester: Secondary | ICD-10-CM

## 2016-11-03 DIAGNOSIS — F1410 Cocaine abuse, uncomplicated: Secondary | ICD-10-CM | POA: Insufficient documentation

## 2016-11-03 DIAGNOSIS — Z87891 Personal history of nicotine dependence: Secondary | ICD-10-CM | POA: Diagnosis not present

## 2016-11-03 DIAGNOSIS — Z91199 Patient's noncompliance with other medical treatment and regimen due to unspecified reason: Secondary | ICD-10-CM | POA: Insufficient documentation

## 2016-11-03 DIAGNOSIS — R0602 Shortness of breath: Secondary | ICD-10-CM | POA: Diagnosis not present

## 2016-11-03 DIAGNOSIS — O99013 Anemia complicating pregnancy, third trimester: Secondary | ICD-10-CM | POA: Diagnosis not present

## 2016-11-03 DIAGNOSIS — O99353 Diseases of the nervous system complicating pregnancy, third trimester: Principal | ICD-10-CM

## 2016-11-03 DIAGNOSIS — D509 Iron deficiency anemia, unspecified: Secondary | ICD-10-CM | POA: Diagnosis not present

## 2016-11-03 DIAGNOSIS — Z3A33 33 weeks gestation of pregnancy: Secondary | ICD-10-CM | POA: Diagnosis not present

## 2016-11-03 DIAGNOSIS — R51 Headache: Secondary | ICD-10-CM | POA: Diagnosis not present

## 2016-11-03 DIAGNOSIS — Z9119 Patient's noncompliance with other medical treatment and regimen: Secondary | ICD-10-CM | POA: Insufficient documentation

## 2016-11-03 LAB — INFLUENZA PANEL BY PCR (TYPE A & B)
Influenza A By PCR: NEGATIVE
Influenza B By PCR: NEGATIVE

## 2016-11-03 LAB — URINALYSIS, ROUTINE W REFLEX MICROSCOPIC
Bilirubin Urine: NEGATIVE
GLUCOSE, UA: NEGATIVE mg/dL
HGB URINE DIPSTICK: NEGATIVE
KETONES UR: 20 mg/dL — AB
NITRITE: NEGATIVE
PH: 7 (ref 5.0–8.0)
PROTEIN: NEGATIVE mg/dL
Specific Gravity, Urine: 1.009 (ref 1.005–1.030)

## 2016-11-03 LAB — GLUCOSE, CAPILLARY
GLUCOSE-CAPILLARY: 132 mg/dL — AB (ref 65–99)
Glucose-Capillary: 92 mg/dL (ref 65–99)

## 2016-11-03 LAB — RAPID URINE DRUG SCREEN, HOSP PERFORMED
Amphetamines: NOT DETECTED
BARBITURATES: NOT DETECTED
Benzodiazepines: NOT DETECTED
Cocaine: POSITIVE — AB
OPIATES: NOT DETECTED
Tetrahydrocannabinol: NOT DETECTED

## 2016-11-03 LAB — KLEIHAUER-BETKE STAIN
# VIALS RHIG: 1
Fetal Cells %: 0 %
Quantitation Fetal Hemoglobin: 0 mL

## 2016-11-03 LAB — COMPREHENSIVE METABOLIC PANEL
ALK PHOS: 142 U/L — AB (ref 38–126)
ALT: 31 U/L (ref 14–54)
AST: 28 U/L (ref 15–41)
Albumin: 2.5 g/dL — ABNORMAL LOW (ref 3.5–5.0)
Anion gap: 9 (ref 5–15)
BILIRUBIN TOTAL: 0.9 mg/dL (ref 0.3–1.2)
CALCIUM: 8.4 mg/dL — AB (ref 8.9–10.3)
CO2: 20 mmol/L — ABNORMAL LOW (ref 22–32)
Chloride: 103 mmol/L (ref 101–111)
Creatinine, Ser: 0.8 mg/dL (ref 0.44–1.00)
GFR calc Af Amer: >60 mL/min (ref >60)
GFR calc non Af Amer: >60 mL/min (ref >60)
Glucose, Bld: 69 mg/dL (ref 65–99)
Potassium: 3.9 mmol/L (ref 3.5–5.1)
Sodium: 132 mmol/L — ABNORMAL LOW (ref 135–145)
Total Protein: 6.7 g/dL (ref 6.5–8.1)

## 2016-11-03 LAB — GC/CHLAMYDIA PROBE AMP (~~LOC~~) NOT AT ARMC
CHLAMYDIA, DNA PROBE: POSITIVE — AB
NEISSERIA GONORRHEA: NEGATIVE

## 2016-11-03 LAB — WET PREP, GENITAL
Sperm: NONE SEEN
TRICH WET PREP: NONE SEEN
Yeast Wet Prep HPF POC: NONE SEEN

## 2016-11-03 LAB — CBC
HEMATOCRIT: 32.5 % — AB (ref 36.0–46.0)
Hemoglobin: 10.9 g/dL — ABNORMAL LOW (ref 12.0–15.0)
MCH: 29.9 pg (ref 26.0–34.0)
MCHC: 33.5 g/dL (ref 30.0–36.0)
MCV: 89.3 fL (ref 78.0–100.0)
Platelets: 186 10*3/uL (ref 150–400)
RBC: 3.64 MIL/uL — ABNORMAL LOW (ref 3.87–5.11)
RDW: 13.5 % (ref 11.5–15.5)
WBC: 7.5 10*3/uL (ref 4.0–10.5)

## 2016-11-03 LAB — FETAL FIBRONECTIN: Fetal Fibronectin: NEGATIVE

## 2016-11-03 LAB — TYPE AND SCREEN
ABO/RH(D): B POS
ANTIBODY SCREEN: NEGATIVE

## 2016-11-03 LAB — ABO/RH: ABO/RH(D): B POS

## 2016-11-03 LAB — PROTEIN / CREATININE RATIO, URINE
Creatinine, Urine: 103 mg/dL
Protein Creatinine Ratio: 0.16 mg/mg{Cre} — ABNORMAL HIGH (ref 0.00–0.15)
Total Protein, Urine: 16 mg/dL

## 2016-11-03 MED ORDER — CALCIUM CARBONATE ANTACID 500 MG PO CHEW: 2.0000 | CHEWABLE_TABLET | ORAL | Status: DC | PRN

## 2016-11-03 MED ORDER — LEVETIRACETAM 500 MG PO TABS
500.0000 mg | ORAL_TABLET | Freq: Two times a day (BID) | ORAL | Status: DC
  Administered 2016-11-03 – 2016-11-04 (×2): 500 mg via ORAL
  Filled 2016-11-03 (×4): qty 1

## 2016-11-03 MED ORDER — LORAZEPAM 2 MG/ML IJ SOLN
1.0000 mg | INTRAMUSCULAR | Status: DC | PRN
  Filled 2016-11-03: qty 1

## 2016-11-03 MED ORDER — PRENATAL MULTIVITAMIN CH
1.0000 | ORAL_TABLET | Freq: Every day | ORAL | Status: DC
  Administered 2016-11-03 – 2016-11-04 (×2): 1 via ORAL
  Filled 2016-11-03 (×3): qty 1

## 2016-11-03 MED ORDER — LEVETIRACETAM 100 MG/ML PO SOLN
500.0000 mg | Freq: Once | ORAL | Status: AC
  Administered 2016-11-03: 500 mg via ORAL
  Filled 2016-11-03: qty 5

## 2016-11-03 MED ORDER — METRONIDAZOLE 500 MG PO TABS
500.0000 mg | ORAL_TABLET | Freq: Two times a day (BID) | ORAL | Status: DC
  Administered 2016-11-03: 500 mg via ORAL
  Filled 2016-11-03: qty 1

## 2016-11-03 MED ORDER — BETAMETHASONE SOD PHOS & ACET 6 (3-3) MG/ML IJ SUSP
12.0000 mg | INTRAMUSCULAR | Status: DC
  Administered 2016-11-03: 12 mg via INTRAMUSCULAR
  Filled 2016-11-03: qty 2

## 2016-11-03 MED ORDER — BETAMETHASONE SOD PHOS & ACET 6 (3-3) MG/ML IJ SUSP
12.0000 mg | Freq: Once | INTRAMUSCULAR | Status: AC
  Administered 2016-11-04: 12 mg via INTRAMUSCULAR
  Filled 2016-11-03: qty 2

## 2016-11-03 MED ORDER — DEXTROSE 5 % IV SOLN
500.0000 mg | INTRAVENOUS | Status: AC
  Administered 2016-11-03 (×2): 500 mg via INTRAVENOUS
  Filled 2016-11-03 (×2): qty 500

## 2016-11-03 MED ORDER — AZITHROMYCIN 250 MG PO TABS
1000.0000 mg | ORAL_TABLET | Freq: Once | ORAL | Status: AC
  Administered 2016-11-03: 1000 mg via ORAL
  Filled 2016-11-03: qty 4

## 2016-11-03 MED ORDER — DOCUSATE SODIUM 100 MG PO CAPS
100.0000 mg | ORAL_CAPSULE | Freq: Every day | ORAL | Status: DC
  Filled 2016-11-03: qty 1

## 2016-11-03 MED ORDER — MAGNESIUM SULFATE BOLUS VIA INFUSION
4.0000 g | Freq: Once | INTRAVENOUS | Status: AC
  Administered 2016-11-03: 4 g via INTRAVENOUS
  Filled 2016-11-03: qty 500

## 2016-11-03 MED ORDER — ZOLPIDEM TARTRATE 5 MG PO TABS: 5.0000 mg | ORAL_TABLET | Freq: Every evening | ORAL | Status: DC | PRN

## 2016-11-03 MED ORDER — ONDANSETRON HCL 4 MG/2ML IJ SOLN
4.0000 mg | Freq: Four times a day (QID) | INTRAMUSCULAR | Status: DC | PRN
Start: 2016-11-03 — End: 2016-11-04
  Administered 2016-11-03: 4 mg via INTRAVENOUS
  Filled 2016-11-03: qty 2

## 2016-11-03 MED ORDER — CYCLOBENZAPRINE HCL 5 MG PO TABS
5.0000 mg | ORAL_TABLET | Freq: Three times a day (TID) | ORAL | Status: DC | PRN
  Administered 2016-11-03 – 2016-11-04 (×2): 5 mg via ORAL
  Filled 2016-11-03 (×3): qty 1

## 2016-11-03 MED ORDER — LEVETIRACETAM 500 MG PO TABS
500.0000 mg | ORAL_TABLET | Freq: Once | ORAL | Status: DC
  Filled 2016-11-03: qty 1

## 2016-11-03 MED ORDER — LACTATED RINGERS IV SOLN
INTRAVENOUS | Status: DC
Start: 2016-11-03 — End: 2016-11-04

## 2016-11-03 MED ORDER — LACTATED RINGERS IV BOLUS (SEPSIS): 1000.0000 mL | Freq: Once | INTRAVENOUS | Status: DC

## 2016-11-03 MED ORDER — MAGNESIUM SULFATE 50 % IJ SOLN
2.0000 g/h | INTRAVENOUS | Status: DC
  Administered 2016-11-03: 2 g/h via INTRAVENOUS
  Filled 2016-11-03: qty 80

## 2016-11-03 MED ORDER — SODIUM CHLORIDE 0.9 % IV SOLN
1000.0000 mg | Freq: Once | INTRAVENOUS | Status: AC
  Administered 2016-11-03: 1000 mg via INTRAVENOUS
  Filled 2016-11-03: qty 10

## 2016-11-03 MED ORDER — LEVETIRACETAM 100 MG/ML PO SOLN: 500.0000 mg | Freq: Two times a day (BID) | ORAL | Status: DC

## 2016-11-03 MED ORDER — ACETAMINOPHEN 325 MG PO TABS
650.0000 mg | ORAL_TABLET | ORAL | Status: DC | PRN
  Administered 2016-11-03: 650 mg via ORAL
  Filled 2016-11-03: qty 2

## 2016-11-03 MED ORDER — ACETAMINOPHEN 500 MG PO TABS
1000.0000 mg | ORAL_TABLET | Freq: Once | ORAL | Status: AC
  Administered 2016-11-03: 1000 mg via ORAL
  Filled 2016-11-03: qty 2

## 2016-11-03 NOTE — MAU Provider Note (Signed)
See H& P.

## 2016-11-03 NOTE — H&P (Signed)
ANTEPARTUM ADMISSION HISTORY AND PHYSICAL NOTE  Chelsey Sullivan is a 31 y.o. female G68P2004 with IUP at [redacted]w[redacted]d by [place] presenting for tonic-clonic seizure while at home with post-ictal state. Pt was brought in to MAU by EMS when family found pt upstairs having tonic-clonic seizure banging her head against the wall. Upon arrival, pt was post-ictal unable to verbalize answers except pain and followed commands. Pt did not indicate she was taking keppra as prescribed. Pt is followed by OB in [place] and Neurology in [place], [place].   Prenatal History/Complications:  Past Medical History: Past Medical History:  Diagnosis Date  . Depression   . Epilepsy [facility])     Past Surgical History: Past Surgical History:  Procedure Laterality Date  . HERNIA REPAIR     umbilical hernia repair x 2.    Obstetrical History: OB History    Gravida Para Term Preterm AB Living   5 3 2     4    SAB TAB Ectopic Multiple Live Births           4      Social History: Social History   Social History  . Marital status: Legally Separated    Spouse name: N/A  . Number of children: N/A  . Years of education: N/A   Social History Main Topics  . Smoking status: Former Smoker    Types: Cigarettes  . Smokeless tobacco: None  . Alcohol use Yes     Comment: occ. none since +pregnancy test.  . Drug use: No  . Sexual activity: Yes   Other Topics Concern  . None   Social History Narrative  . None    Family History: Family History  Problem Relation Age of Onset  . Hypertension Other   . Diabetes Other   . Epilepsy Other     Allergies: Allergies  Allergen Reactions  . Amoxicillin Rash  . Penicillins Rash    Has patient had a PCN reaction causing immediate rash, facial/tongue/throat swelling, SOB or lightheadedness with hypotension: Yes Has patient had a PCN reaction causing severe rash involving mucus membranes or skin necrosis: No Has patient had a PCN reaction that required hospitalization No Has  patient had a PCN reaction occurring within the last 10 years: Yes If all of the above answers are "NO", then may proceed with Cephalosporin use.     Prescriptions Prior to Admission  Medication Sig Dispense Refill Last Dose  . acetaminophen (TYLENOL) 500 MG tablet Take 1,000 mg by mouth every 6 (six) hours as needed for headache.   12/09/2015  . albuterol (PROVENTIL HFA;VENTOLIN HFA) 108 (90 BASE) MCG/ACT inhaler Inhale 2 puffs into the lungs every 6 (six) hours as needed for wheezing or shortness of breath.   unknown  . citalopram (CELEXA) 20 MG tablet Take 20 mg by mouth daily.   12/09/2015  . naproxen (NAPROSYN) 500 MG tablet Take 1 tablet (500 mg total) by mouth 2 (two) times daily with a meal. 20 tablet 0   . nitrofurantoin, macrocrystal-monohydrate, (MACROBID) 100 MG capsule Take 1 capsule (100 mg total) by mouth 2 (two) times daily. For 7 days 14 capsule 0   . phenazopyridine (PYRIDIUM) 200 MG tablet Take 1 tablet (200 mg total) by mouth 3 (three) times daily. 6 tablet 0   . [DISCONTINUED] levETIRAcetam (KEPPRA) 500 MG tablet Take 500 mg by mouth 2 (two) times daily.   12/09/2015     Review of Systems   All systems reviewed and negative except as stated  in HPI  Blood pressure 94/75, pulse (!) 109, temperature 98.3 F (36.8 C), temperature source Oral, resp. rate 20, height 5\' 9"  (1.753 m), last menstrual period 12/03/2015, SpO2 95 %, unknown if currently breastfeeding. General appearance: alert, cooperative, mild distress, morbidly obese and slowed mentation Lungs: clear to auscultation bilaterally Heart: regular rate and rhythm Abdomen: soft, non-tender; bowel sounds normal Extremities: No calf swelling or tenderness Neuro: CNII-XII intact, moves all 4 extremities, no focal deficits Presentation: cephalic by [place] on admission Fetal monitoring: FHR 155 bpm, moderate variability, accelerations present, no decelerations Uterine activity: irregular     Prenatal labs: ABO, Rh:  --/--/B POS (03/13 0010) Antibody: NEG (03/13 0010) Rubella: Immune RPR: Non-reactive HBsAg: Negative HIV: Non-reactive GBS: Unknown 1 hr Glucola: Elevated (164), 3 hr not performed Genetic screening:  Normal Anatomy US: Placenta previs by [place] 07/10/2016  Prenatal Transfer Tool  Maternal Diabetes: Gestational DM not on medications Genetic Screening: Normal Maternal Ultrasounds/Referrals: Abnormal:  Findings:   Other:Placenta previa 07/10/16 Fetal Ultrasounds or other Referrals:  None Maternal Substance Abuse:  Yes:  Type: Cocaine Significant Maternal Medications:  Meds include: Other: Keppra Significant Maternal Lab Results: Lab values include: Other: GBS unknown  Results for orders placed or performed during the hospital encounter of 11/02/16 (from the past 24 hour(s))  CBC   Collection Time: 11/03/16 12:10 AM  Result Value Ref Range   WBC 7.5 4.0 - 10.5 K/uL   RBC 3.64 (L) 3.87 - 5.11 MIL/uL   Hemoglobin 10.9 (L) 12.0 - 15.0 g/dL   HCT 16.1 (L) 09.6 - 04.5 %   MCV 89.3 78.0 - 100.0 fL   MCH 29.9 26.0 - 34.0 pg   MCHC 33.5 30.0 - 36.0 g/dL   RDW 40.9 81.1 - 91.4 %   Platelets 186 150 - 400 K/uL  Comprehensive metabolic panel   Collection Time: 11/03/16 12:10 AM  Result Value Ref Range   Sodium 132 (L) 135 - 145 mmol/L   Potassium 3.9 3.5 - 5.1 mmol/L   Chloride 103 101 - 111 mmol/L   CO2 20 (L) 22 - 32 mmol/L   Glucose, Bld 69 65 - 99 mg/dL   BUN <5 (L) 6 - 20 mg/dL   Creatinine, Ser 7.82 0.44 - 1.00 mg/dL   Calcium 8.4 (L) 8.9 - 10.3 mg/dL   Total Protein 6.7 6.5 - 8.1 g/dL   Albumin 2.5 (L) 3.5 - 5.0 g/dL   AST 28 15 - 41 U/L   ALT 31 14 - 54 U/L   Alkaline Phosphatase 142 (H) 38 - 126 U/L   Total Bilirubin 0.9 0.3 - 1.2 mg/dL   GFR calc non Af Amer >60 >60 mL/min   GFR calc Af Amer >60 >60 mL/min   Anion gap 9 5 - 15  Kleihauer-Betke stain   Collection Time: 11/03/16 12:10 AM  Result Value Ref Range   Fetal Cells % 0 %   Quantitation Fetal Hemoglobin 0 mL    # Vials RhIg 1   Type and screen   Collection Time: 11/03/16 12:10 AM  Result Value Ref Range   ABO/RH(D) B POS    Antibody Screen NEG    Sample Expiration 11/06/2016   Urine rapid drug screen (hosp performed)not at [facility]   Collection Time: 11/03/16 12:40 AM  Result Value Ref Range   Opiates NONE DETECTED NONE DETECTED   Cocaine POSITIVE (A) NONE DETECTED   Benzodiazepines NONE DETECTED NONE DETECTED   Amphetamines NONE DETECTED NONE DETECTED   Tetrahydrocannabinol  NONE DETECTED NONE DETECTED   Barbiturates NONE DETECTED NONE DETECTED  Protein / creatinine ratio, urine   Collection Time: 11/03/16 12:40 AM  Result Value Ref Range   Creatinine, Urine 103.00 mg/dL   Total Protein, Urine 16 mg/dL   Protein Creatinine Ratio 0.16 (H) 0.00 - 0.15 mg/mg[Cre]  Urinalysis, Routine w reflex microscopic   Collection Time: 11/03/16 12:40 AM  Result Value Ref Range   Color, Urine YELLOW YELLOW   APPearance CLEAR CLEAR   Specific Gravity, Urine 1.009 1.005 - 1.030   pH 7.0 5.0 - 8.0   Glucose, UA NEGATIVE NEGATIVE mg/dL   Hgb urine dipstick NEGATIVE NEGATIVE   Bilirubin Urine NEGATIVE NEGATIVE   Ketones, ur 20 (A) NEGATIVE mg/dL   Protein, ur NEGATIVE NEGATIVE mg/dL   Nitrite NEGATIVE NEGATIVE   Leukocytes, UA LARGE (A) NEGATIVE   RBC / HPF 0-5 0 - 5 RBC/hpf   WBC, UA 6-30 0 - 5 WBC/hpf   Bacteria, UA RARE (A) NONE SEEN   Squamous Epithelial / LPF 0-5 (A) NONE SEEN   Mucous PRESENT   Wet prep, genital   Collection Time: 11/03/16  3:35 AM  Result Value Ref Range   Yeast Wet Prep HPF POC NONE SEEN NONE SEEN   Trich, Wet Prep NONE SEEN NONE SEEN   Clue Cells Wet Prep HPF POC PRESENT (A) NONE SEEN   WBC, Wet Prep HPF POC MANY (A) NONE SEEN   Sperm NONE SEEN   Fetal fibronectin   Collection Time: 11/03/16  3:35 AM  Result Value Ref Range   Fetal Fibronectin NEGATIVE NEGATIVE    Patient Active Problem List   Diagnosis Date Noted  . Seizure disorder during pregnancy in third  trimester (HCC) 11/03/2016  . Seizures (HCC) 11/24/2012    Assessment: Willadean CarolShanika Umeda is a 31 y.o. G5P2004 at 3745w6d here for tonic-clonic seizure presenting in post-ictal state. Head CT negative on arrival. Will admit to antepartum unit for monitoring and further management.  #Labor:Not in active labor, FFN negative #Pain: Lower back pain, tylenol #FWB: Category I #ID:  GBS unknown #Social:Cocaine per UDS on admission #Siezure:Keppra lvl pending, Keppra ordered  Wendee Beaversavid J McMullen, DO, PGY-1 11/03/2016, 4:20 AM   I was present for the exam and agree with above. Pt now A&Ox4. Able to answer more questions about medical and Ob Hx. Seizures likely from preexisting seizure disorder. No evidence or Pre-E. Will obtain neuro consult.   Hx Previa. US today shows resolution-->posterior placenta above os.   [place] Jadence Kinlaw, CNM 11/03/2016 4:48 AM

## 2016-11-03 NOTE — MAU Note (Signed)
Patient's mother called requesting information about the patient. Informed that no information could be given over the phone and requested that family come to the hospital if possible. Patient's mother will try to have a family member come to the hospital.

## 2016-11-03 NOTE — Clinical SW OB High Risk (Signed)
Clinical Social Work Antenatal   Clinical Social Worker:  Barbee ShropshireShaw, Saphyra Hutt [place], [place] Date/Time:  11/03/2016, 2:47 PM Gestational Age on Admission:  31 y.o. Admitting Diagnosis: Seizure disorder affecting pregnancy   Expected Delivery Date:  12/16/16  Family/Home Environment  Home Address: Patient states she just moved and cannot recall the address of [place] new [place].  Household Member/Support Name: Wandra ArthursChristopher Mitchell Relationship:  Significant Other Other Support: Patient reports that her aunt is supportive and currently has "temporary custody" of her 4 children.   Psychosocial Data  Information Source:  Patient Interview Resources:    Employment:    Medicaid [facility]): [employer]:   Current Grade:   Homebound Arranged:  N/A  Other Resources:  Medicaid  Cultural/Environment Issues Impacting Care: None stated.     Strengths/Weaknesses/Factors to Consider  Concerns Related to Hospitalization: None stated.  Patient's only question is "are they (CPS) going to take my baby?"  CSW explained that a report will be made if she delivers here because of substance use in pregnancy and open CPS case on other children.  CSW cannot speak to what will happen if she delivers [facility] as she plans.  CSW states that a report does not necessarily mean loss of custody, but informed patient that it is a possibility especially if baby is born positive for cocaine.  Patient replied, "she won't be positive."  Patient is aware that she is positive on admission today and does not deny use, however, states, "I don't do it like that.  It's not an every day thing."  She reports that she does not currently have an open case with CPS, however, reports that her aunt has "temporary custody" of her 4 other children.   Previous Pregnancies/Feelings Towards Pregnancy?  Concerns related to being/becoming a mother?: None stated.  Social Support (FOB? Who is/will be helping  with baby/other kids?): Patient reports that this is her boyfriend's first baby and that he is involved and supportive.  Couples Relationship (describe): She describes her relationship with FOB as positive.   Recent Stressful Life Events (life changes in past year?): Patient reports that she lost her baby's father about a year ago from a gunshot wound.  CSW clarified which baby's father and she reports the father of her 31 year old.  She disclosed this when CSW asked about her first use of cocaine.  She states she does not recall her last use of cocaine, but that she was stressed due to her uncle dying 2 months ago.  Patient feels she is using cocaine to self medicate due to grief and depression.   Prenatal Care/Education/Home Preparations: Not discussed.   Domestic Violence (of any type):  No If Yes to Domestic Violence, Describe/Action Plan:     Substance Use During Pregnancy: Yes   Follow-up Recommendations: Patient does not think she needs inpatient substance abuse treatment and states, " I don't need it.  I can quit anytime."  CSW recommends substance abuse treatment and mental health treatment.  CSW provided outpatient resources to patient.     Patient Advised/Response: Patient acknowledges need for counseling and was receptive to resources provided by CSW.   Other:     Clinical Assessment/Plan: Patient presents with flat affect, but was pleasant and agreeable to CSW's visit.  She reports she does not remember much from around the time of her seizure and states that she has a hx of seizures.  She reports that she lives with FOB and that he is  involved and supportive.  She is willing to seek mental health treatment and does not feel she needs assistance with initiating services.  She was appreciative of information provided and appeared appreciative of CSW's concern for her wellbeing.  Patient states plans to deliver at [facility] and states she has gotten prenatal care in  [place].

## 2016-11-03 NOTE — Progress Notes (Signed)
OB Note D/w Dr. Cena BentonVega and since very likely a provoked seizure (non compliance, cocaine use) and patient improving, recommendations are: 1) load with 1gm keppra IV and continue on 500mg  bid 2) no driving for at least 5838m 3) Keppra level not useful and no change in management based on this  Cornelia Copaharlie Rendy Lazard, Jr MD Attending Center for [facility]Faculty Practice) 11/03/2016 Time: 0730

## 2016-11-03 NOTE — MAU Note (Signed)
[facility] in [facility]. Postictal state explained and requested information regarding pt care.   Due date 12/16/16 G5P4 all vaginal deliveries States as of November the patient had a complete previa.  Allergy to penicillin.   Requested prenatal records be sent to MAU if possible due to emergent nature of patient's status.

## 2016-11-03 NOTE — Progress Notes (Addendum)
Pt heaving into trash can. vomitted liquids. States pills or grape juice made her feel nauseous.   2136: MD notified of pt throwing up. Orders received for 1gm IV zithromax now.   2210: 1gm Zithromax up. Dinner tray set up. Pt eating meal  2300: pt heaving in vomit bag.  2315: MD notified for zofran order.   2320: up to bathroom and voided. pericare given. Bed linen and gown changed.   2339: EFM applied. Noted flutter like constant movement of fetus. 2347:  MD notified report status of above given. Ordered for stat U/S. Instructed tech to call U/s for stat bs U/S per MD orders.  Provider in unit putting U/S orders.   U/S at bs.   0030: Provider at bs discussing u/s results with pt. Orders received for cont EFM.   Monitor adjusted.   0500: Relinquished care over to house coverage.

## 2016-11-04 ENCOUNTER — Encounter (HOSPITAL_COMMUNITY): Payer: Self-pay | Admitting: *Deleted

## 2016-11-04 ENCOUNTER — Observation Stay (HOSPITAL_COMMUNITY): Payer: Medicaid Other

## 2016-11-04 DIAGNOSIS — O9989 Other specified diseases and conditions complicating pregnancy, childbirth and the puerperium: Secondary | ICD-10-CM

## 2016-11-04 DIAGNOSIS — G40909 Epilepsy, unspecified, not intractable, without status epilepticus: Secondary | ICD-10-CM | POA: Diagnosis not present

## 2016-11-04 DIAGNOSIS — R51 Headache: Secondary | ICD-10-CM

## 2016-11-04 DIAGNOSIS — D508 Other iron deficiency anemias: Secondary | ICD-10-CM

## 2016-11-04 DIAGNOSIS — R531 Weakness: Secondary | ICD-10-CM

## 2016-11-04 DIAGNOSIS — O99810 Abnormal glucose complicating pregnancy: Secondary | ICD-10-CM

## 2016-11-04 DIAGNOSIS — O99353 Diseases of the nervous system complicating pregnancy, third trimester: Secondary | ICD-10-CM | POA: Diagnosis not present

## 2016-11-04 DIAGNOSIS — R519 Headache, unspecified: Secondary | ICD-10-CM | POA: Diagnosis present

## 2016-11-04 DIAGNOSIS — R0602 Shortness of breath: Secondary | ICD-10-CM | POA: Diagnosis not present

## 2016-11-04 DIAGNOSIS — D509 Iron deficiency anemia, unspecified: Secondary | ICD-10-CM | POA: Diagnosis present

## 2016-11-04 LAB — COMPREHENSIVE METABOLIC PANEL
ALT: 29 U/L (ref 14–54)
AST: 27 U/L (ref 15–41)
Albumin: 2.4 g/dL — ABNORMAL LOW (ref 3.5–5.0)
Alkaline Phosphatase: 121 U/L (ref 38–126)
Anion gap: 5 (ref 5–15)
BUN: <5 mg/dL — ABNORMAL LOW (ref 6–20)
CALCIUM: 7.8 mg/dL — AB (ref 8.9–10.3)
CO2: 21 mmol/L — ABNORMAL LOW (ref 22–32)
CREATININE: 0.89 mg/dL (ref 0.44–1.00)
Chloride: 109 mmol/L (ref 101–111)
Glucose, Bld: 145 mg/dL — ABNORMAL HIGH (ref 65–99)
Potassium: 3.4 mmol/L — ABNORMAL LOW (ref 3.5–5.1)
Sodium: 135 mmol/L (ref 135–145)
Total Bilirubin: 0.3 mg/dL (ref 0.3–1.2)
Total Protein: 6.4 g/dL — ABNORMAL LOW (ref 6.5–8.1)

## 2016-11-04 LAB — CBC
HCT: 29.8 % — ABNORMAL LOW (ref 36.0–46.0)
HEMOGLOBIN: 9.5 g/dL — AB (ref 12.0–15.0)
MCH: 28.9 pg (ref 26.0–34.0)
MCHC: 31.9 g/dL (ref 30.0–36.0)
MCV: 90.6 fL (ref 78.0–100.0)
PLATELETS: 172 10*3/uL (ref 150–400)
RBC: 3.29 MIL/uL — AB (ref 3.87–5.11)
RDW: 13.7 % (ref 11.5–15.5)
WBC: 6 10*3/uL (ref 4.0–10.5)

## 2016-11-04 LAB — GLUCOSE, CAPILLARY
GLUCOSE-CAPILLARY: 125 mg/dL — AB (ref 65–99)
GLUCOSE-CAPILLARY: 143 mg/dL — AB (ref 65–99)
GLUCOSE-CAPILLARY: 160 mg/dL — AB (ref 65–99)

## 2016-11-04 LAB — HEMOGLOBIN A1C
Hgb A1c MFr Bld: 5.1 % (ref 4.8–5.6)
MEAN PLASMA GLUCOSE: 100 mg/dL

## 2016-11-04 LAB — LEVETIRACETAM LEVEL: Levetiracetam Lvl: 2.3 ug/mL — ABNORMAL LOW (ref 10.0–40.0)

## 2016-11-04 MED ORDER — ALBUTEROL SULFATE (2.5 MG/3ML) 0.083% IN NEBU
2.5000 mg | INHALATION_SOLUTION | Freq: Four times a day (QID) | RESPIRATORY_TRACT | Status: DC
  Filled 2016-11-04: qty 3

## 2016-11-04 MED ORDER — IPRATROPIUM-ALBUTEROL 0.5-2.5 (3) MG/3ML IN SOLN
3.0000 mL | Freq: Four times a day (QID) | RESPIRATORY_TRACT | Status: DC
  Administered 2016-11-04: 3 mL via RESPIRATORY_TRACT
  Filled 2016-11-04 (×5): qty 3

## 2016-11-04 MED ORDER — PRENATAL MULTIVITAMIN CH: 1.0000 | ORAL_TABLET | Freq: Every day | ORAL | 3 refills | Status: DC

## 2016-11-04 MED ORDER — LEVETIRACETAM 500 MG PO TABS: 500.0000 mg | ORAL_TABLET | Freq: Two times a day (BID) | ORAL | 2 refills | Status: DC

## 2016-11-04 MED ORDER — FERROUS SULFATE 325 (65 FE) MG PO TABS
325.0000 mg | ORAL_TABLET | Freq: Three times a day (TID) | ORAL | Status: DC
  Administered 2016-11-04: 325 mg via ORAL
  Filled 2016-11-04: qty 1

## 2016-11-04 MED ORDER — FERROUS SULFATE 325 (65 FE) MG PO TABS
325.0000 mg | ORAL_TABLET | Freq: Two times a day (BID) | ORAL | 3 refills | Status: DC
Start: 2016-11-04 — End: 2018-08-29

## 2016-11-04 MED ORDER — LACTATED RINGERS IV BOLUS (SEPSIS)
1000.0000 mL | Freq: Once | INTRAVENOUS | Status: AC
  Administered 2016-11-04: 1000 mL via INTRAVENOUS

## 2016-11-04 MED ORDER — ACETAMINOPHEN 325 MG PO TABS: 650.0000 mg | ORAL_TABLET | ORAL | 3 refills | Status: DC | PRN

## 2016-11-04 NOTE — Progress Notes (Signed)
Patient removed from monitor so she can prepare for discharge per Dr Jolayne Pantheronstant.

## 2016-11-04 NOTE — Progress Notes (Signed)
Discharge instructions reviewed with patient.  She will follow up with her OB tomorrow.  I explained her prescriptions are at her pharmacy.

## 2016-11-04 NOTE — Progress Notes (Addendum)
Patient ID: Chelsey Sullivan, female   DOB: [birth year], 31 y.o.   MRN: [id number]  FACULTY PRACTICE ANTEPARTUM NOTE  Chelsey CarolShanika Shillingburg is a 31 y.o. G5P2004 at [redacted]w[redacted]d  who is admitted for provoked seizure.   Fetal presentation is cephalic. Length of Stay:  0  Days  Subjective: Patient states that she feels weak and feels like she's going to have a seizure. She continues to have coughing and feels like her breathing is "tight" and that she's breathing through a straw. She states that she has a history of asthma and takes albuterol twice a day, although uncertain of how long she has been doing this. She states that she is in a safe home environment. Patient reports good fetal movement.   She reports no uterine contractions She reports no bleeding  She reports no loss of fluid per vagina.  Vitals:  Blood pressure (!) 109/56, pulse 92, temperature 98.7 F (37.1 C), temperature source Oral, resp. rate 20, height 5\' 9"  (1.753 m), last menstrual period 12/03/2015, SpO2 93 %, unknown if currently breastfeeding. Physical Examination:  General appearance - alert, well appearing, and in no distress Chest - very shallow breathing. Does sound a little tight, but no wheezing. Heart - normal rate, regular rhythm, normal S1, S2, no murmurs, rubs, clicks or gallops Abdomen - soft, nontender, nondistended, no masses or organomegaly Neurological - alert, oriented, normal speech, no focal findings or movement disorder noted, but globally strength is 4/5.  Fundal Height:  size equals dates Extremities: extremities normal, atraumatic, no cyanosis or edema   Fetal Monitoring:  Baseline: 130 bpm, Variability: Good {> 6 bpm), Accelerations: Reactive and Decelerations: Absent  Labs:  Results for orders placed or performed during the hospital encounter of 11/02/16 (from the past 24 hour(s))  Glucose, capillary   Collection Time: 11/03/16  9:33 AM  Result Value Ref Range   Glucose-Capillary 92 65 - 99 mg/dL  Glucose,  capillary   Collection Time: 11/03/16  5:20 PM  Result Value Ref Range   Glucose-Capillary 132 (H) 65 - 99 mg/dL  Glucose, capillary   Collection Time: 11/04/16 12:12 AM  Result Value Ref Range   Glucose-Capillary 143 (H) 65 - 99 mg/dL  CBC   Collection Time: 11/04/16 12:22 AM  Result Value Ref Range   WBC 6.0 4.0 - 10.5 K/uL   RBC 3.29 (L) 3.87 - 5.11 MIL/uL   Hemoglobin 9.5 (L) 12.0 - 15.0 g/dL   HCT 40.929.8 (L) 81.136.0 - 91.446.0 %   MCV 90.6 78.0 - 100.0 fL   MCH 28.9 26.0 - 34.0 pg   MCHC 31.9 30.0 - 36.0 g/dL   RDW 78.213.7 95.611.5 - 21.315.5 %   Platelets 172 150 - 400 K/uL  Comprehensive metabolic panel   Collection Time: 11/04/16 12:22 AM  Result Value Ref Range   Sodium 135 135 - 145 mmol/L   Potassium 3.4 (L) 3.5 - 5.1 mmol/L   Chloride 109 101 - 111 mmol/L   CO2 21 (L) 22 - 32 mmol/L   Glucose, Bld 145 (H) 65 - 99 mg/dL   BUN <5 (L) 6 - 20 mg/dL   Creatinine, Ser 0.860.89 0.44 - 1.00 mg/dL   Calcium 7.8 (L) 8.9 - 10.3 mg/dL   Total Protein 6.4 (L) 6.5 - 8.1 g/dL   Albumin 2.4 (L) 3.5 - 5.0 g/dL   AST 27 15 - 41 U/L   ALT 29 14 - 54 U/L   Alkaline Phosphatase 121 38 - 126 U/L   Total  Bilirubin 0.3 0.3 - 1.2 mg/dL   GFR calc non Af Amer >60 >60 mL/min   GFR calc Af Amer >60 >60 mL/min   Anion gap 5 5 - 15  Glucose, capillary   Collection Time: 11/04/16  6:08 AM  Result Value Ref Range   Glucose-Capillary 125 (H) 65 - 99 mg/dL    Imaging Studies:    [place] last night BPP 8/8. [place] ? Possible true knot. Discussed this with MFM, who thought that although it is possible, true knots are extremely difficult to determine on [place].    Medications:  Scheduled . albuterol  2.5 mg Nebulization Q6H  . ipratropium-albuterol  3 mL Nebulization Q6H  . levETIRAcetam  500 mg Oral BID  . prenatal multivitamin  1 tablet Oral Q1200   I have reviewed the patient's current medications.  ASSESSMENT: Active Problems:   Seizure disorder during pregnancy in third trimester (HCC)   Abnormal glucose  affecting pregnancy   Anemia, iron deficiency   Headache   Weakness   SOB (shortness of breath)   PLAN: 1. Seizure disorder  Currently no generalized seizure activity.  Continue keppra  MFM recommends twice weekly NST with serial growth. 2. Abnormal glucose  Blood sugars have been variable. 3. Iron deficiency anemia  Start oral iron 4. Headache  No evidence of preeclampsia  Improved with tylenol - will continue. 5. Weakness   ? Etiology. Medication vs secondary to SOB  Patient was also weak yesterday morning, which I interpreted as coming off from cocaine.  Pt not post-ictal from generalized seizure (patient has been on tocometry continuously and would have seen artifact from the movements).  If not improving later today, would consider consulting neuro or hospitalist team. 6. SOB  Patient not tachypneic and appears to be breathing comfortably, although very shallow.  Will give pt breathing treatment.  No wheezing observed.  If not improving, may need to contact hospitalist  From CXR yesterday, atelectasis. Observed inspiratory effort - pt able to get to  Continue routine antenatal care.   Levie Heritage, DO 11/04/2016,9:11 AM

## 2016-11-04 NOTE — Discharge Summary (Signed)
Obstetric Discharge Summary Reason for Admission: active seizure in third trimester of pregnancy Prenatal Procedures: none Intrapartum Procedures: Control of seizure disorder  Hemoglobin  Date Value Ref Range Status  11/04/2016 9.5 (L) 12.0 - 15.0 g/dL Final   HCT  Date Value Ref Range Status  11/04/2016 29.8 (L) 36.0 - 46.0 % Final   Patient admitted with active seizures at 6354w6d. Patient with history of seizure disorder with poor compliance with medications. Seizure was stabilized with Kepra. Patient received adequate preantal care in [place]. Her vital signs and lab values were normal on admission and thus preeclampsia/eclampsia was rule out. Patient remained stable throughout her admission. Fetal status remained reassuring. Patient was found stable for discharge following a breathing treatment this morning. She plans to follow up tomorrow for her routine prenatal care. Patient reports no questions on how to take her medication and states that she understands the importance of taking them as prescribed particularly the antiepileptics.  Physical Exam:  General: alert, cooperative and no distress Heart: regular rate and rhythm Lungs: Clear to auscultation bilaterally Abdomen: soft, gravid, non tender IDVT Evaluation: No evidence of DVT seen on physical exam.  Discharge Diagnoses: Preterm pregnancy with seizure disorder  Discharge Information: Date: 11/04/2016 Activity: unrestricted Diet: routine Medications: Keppra Condition: stable Instructions: refer to practice specific booklet Discharge to: home Follow-up Information    [place].   Why:  Follow up for routine prenatal care tomorrow as scheduled          Chelsey Sullivan 11/04/2016, 1:04 PM

## 2016-11-04 NOTE — Progress Notes (Signed)
Patient ID: Chelsey Sullivan, female   DOB: 07-07-1986, 31 y.o.   MRN: [id number]  Called by nurse to bedside - patient has vomited a few times. Zofran given. When putting the baby on the monitor, FHT was noted as tachycardic and it appeared that the baby was "shaking intermittently" en utero. On my examination, no abnormal movements were noted. Will get BPP to evaluate baby. Additionally, will get CBC, CMP, CBG.   FHT returning to baseline of 160 with accelerations that barely meet criteria of 15x15. Will bolus with 1L LR.   Levie HeritageJacob J Ramatoulaye Pack, DO 11/04/2016 12:11 AM

## 2016-11-04 NOTE — Progress Notes (Signed)
After last check patient seemed to be more alert but this check she seems groggy again.  She is c/o back pain.

## 2016-11-04 NOTE — MAU Note (Signed)
Received pt's +Chlamydia results. When opening pt's chart through "Patient station" noticed she was a current admit. Upon looking at notes and pt meds, seen pt was treated for her +Chlamydia on 11/03/16 during her admission. Viewed demographics to fill out CCD Report. [facility]

## 2016-11-04 NOTE — Progress Notes (Signed)
Patient to be discharged home.

## 2016-11-04 NOTE — Progress Notes (Signed)
Patient talking on phone.  She seems more alert than earlier this morning.  She said she thinks the breathing treatment helped.

## 2017-06-28 ENCOUNTER — Encounter (HOSPITAL_COMMUNITY): Payer: Self-pay

## 2018-05-01 ENCOUNTER — Encounter (HOSPITAL_COMMUNITY): Payer: Self-pay | Admitting: Emergency Medicine

## 2018-05-01 ENCOUNTER — Other Ambulatory Visit: Payer: Self-pay

## 2018-05-01 ENCOUNTER — Emergency Department (HOSPITAL_COMMUNITY)
Admission: EM | Admit: 2018-05-01 | Discharge: 2018-05-01 | Disposition: A | Discharge status: Home / Self Care | Payer: Medicaid Other | Attending: Emergency Medicine | Admitting: Emergency Medicine

## 2018-05-01 DIAGNOSIS — Z79899 Other long term (current) drug therapy: Secondary | ICD-10-CM | POA: Diagnosis not present

## 2018-05-01 DIAGNOSIS — M545 Low back pain, unspecified: Secondary | ICD-10-CM

## 2018-05-01 DIAGNOSIS — R2242 Localized swelling, mass and lump, left lower limb: Secondary | ICD-10-CM | POA: Insufficient documentation

## 2018-05-01 DIAGNOSIS — Z87891 Personal history of nicotine dependence: Secondary | ICD-10-CM | POA: Diagnosis not present

## 2018-05-01 DIAGNOSIS — R600 Localized edema: Secondary | ICD-10-CM

## 2018-05-01 DIAGNOSIS — R2241 Localized swelling, mass and lump, right lower limb: Secondary | ICD-10-CM | POA: Diagnosis not present

## 2018-05-01 LAB — PREGNANCY, URINE: PREG TEST UR: NEGATIVE

## 2018-05-01 LAB — CBC WITH DIFFERENTIAL/PLATELET
BASOS ABS: 0 10*3/uL (ref 0.0–0.1)
Basophils Relative: 1 %
Eosinophils Absolute: 0.3 10*3/uL (ref 0.0–0.7)
Eosinophils Relative: 5 %
HCT: 40.2 % (ref 36.0–46.0)
HEMOGLOBIN: 12.7 g/dL (ref 12.0–15.0)
LYMPHS ABS: 1.5 10*3/uL (ref 0.7–4.0)
LYMPHS PCT: 27 %
MCH: 28.8 pg (ref 26.0–34.0)
MCHC: 31.6 g/dL (ref 30.0–36.0)
MCV: 91.2 fL (ref 78.0–100.0)
Monocytes Absolute: 0.3 10*3/uL (ref 0.1–1.0)
Monocytes Relative: 6 %
NEUTROS PCT: 62 %
Neutro Abs: 3.4 10*3/uL (ref 1.7–7.7)
Platelets: 229 10*3/uL (ref 150–400)
RBC: 4.41 MIL/uL (ref 3.87–5.11)
RDW: 13.2 % (ref 11.5–15.5)
WBC: 5.5 10*3/uL (ref 4.0–10.5)

## 2018-05-01 LAB — COMPREHENSIVE METABOLIC PANEL
ALK PHOS: 65 U/L (ref 38–126)
ALT: 17 U/L (ref 0–44)
AST: 18 U/L (ref 15–41)
Albumin: 3.6 g/dL (ref 3.5–5.0)
Anion gap: 8 (ref 5–15)
BUN: 10 mg/dL (ref 6–20)
CALCIUM: 9.1 mg/dL (ref 8.9–10.3)
CO2: 25 mmol/L (ref 22–32)
CREATININE: 1.09 mg/dL — AB (ref 0.44–1.00)
Chloride: 109 mmol/L (ref 98–111)
GFR calc Af Amer: >60 mL/min (ref >60)
Glucose, Bld: 116 mg/dL — ABNORMAL HIGH (ref 70–99)
Potassium: 3.2 mmol/L — ABNORMAL LOW (ref 3.5–5.1)
SODIUM: 142 mmol/L (ref 135–145)
Total Bilirubin: 0.4 mg/dL (ref 0.3–1.2)
Total Protein: 7.2 g/dL (ref 6.5–8.1)

## 2018-05-01 LAB — URINALYSIS, ROUTINE W REFLEX MICROSCOPIC
Bilirubin Urine: NEGATIVE
Glucose, UA: NEGATIVE mg/dL
HGB URINE DIPSTICK: NEGATIVE
Ketones, ur: 20 mg/dL — AB
Leukocytes, UA: NEGATIVE
Nitrite: POSITIVE — AB
PROTEIN: NEGATIVE mg/dL
SPECIFIC GRAVITY, URINE: 1.027 (ref 1.005–1.030)
pH: 6 (ref 5.0–8.0)

## 2018-05-01 MED ORDER — ENOXAPARIN SODIUM 100 MG/ML ~~LOC~~ SOLN
1.0000 mg/kg | Freq: Once | SUBCUTANEOUS | Status: AC
  Administered 2018-05-01: 100 mg via SUBCUTANEOUS
  Filled 2018-05-01: qty 1

## 2018-05-01 MED ORDER — TRAMADOL HCL 50 MG PO TABS
50.0000 mg | ORAL_TABLET | Freq: Once | ORAL | Status: AC
  Administered 2018-05-01: 50 mg via ORAL
  Filled 2018-05-01: qty 1

## 2018-05-01 NOTE — Discharge Instructions (Addendum)
Blood test showed no life-threatening condition.  Decrease salt in your diet.  Elevate legs.  Wear support hose.  You will need to have a ultrasound of your legs to rule out a blood clot (tomorrow).  RN will discuss.

## 2018-05-01 NOTE — ED Notes (Signed)
Pt was informed that we need a urine sample. Pt states that she can not urinate at this time. 

## 2018-05-01 NOTE — ED Triage Notes (Signed)
Pt here with c/o swelling in both lower extremities x 1 month. Pt c/o pain to touch. Pt also c/o lower back pain since birth of son in July.

## 2018-05-01 NOTE — ED Provider Notes (Signed)
[facility] EMERGENCY DEPARTMENT Provider Note   CSN: [id number] Arrival date & time: 05/01/18  1913     History   Chief Complaint Chief Complaint  Patient presents with  . swelling of lower extremities    HPI Chelsey Sullivan is a 32 y.o. female.  Complains of swelling in bilateral lower extremities (left greater than right) for several weeks.  No chest pain or dyspnea.  Last menstrual period 1 month ago.  Past medical history includes seizure disorder.  No prolonged travel, immobilization, recent surgery, diagnosis of cancer.  Severity of symptoms is moderate.  ROS positive for low back pain.     Past Medical History:  Diagnosis Date  . Depression   . Epilepsy [facility]: [facility] Rhonda W)     Patient Active Problem List   Diagnosis Date Noted  . Anemia, iron deficiency 11/04/2016  . Headache 11/04/2016  . Weakness 11/04/2016  . SOB (shortness of breath) 11/04/2016  . Seizure disorder during pregnancy in third trimester (HCC) 11/03/2016  . 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 Patient noncompliance 11/03/2016  . Abnormal glucose affecting pregnancy 11/03/2016    Past Surgical History:  Procedure Laterality Date  . HERNIA REPAIR     umbilical hernia repair x 2.     OB History    Gravida  5   Para  3   Term  2   Preterm      AB      Living  4     SAB      TAB      Ectopic      Multiple      Live Births  4            Home Medications    Prior to Admission medications   Medication Sig Start Date End Date Taking? Authorizing Provider  albuterol (PROVENTIL HFA;VENTOLIN HFA) 108 (90 BASE) MCG/ACT inhaler Inhale 2 puffs into the lungs every 6 (six) hours as needed for wheezing or shortness of breath.   Yes [provider]  citalopram (CELEXA) 20 MG tablet Take 20 mg by mouth daily.   Yes [provider]  ferrous sulfate 325 (65 FE) MG tablet Take 1 tablet (325 mg total) by mouth 2 (two) times daily with a meal.  11/04/16  Yes Constant, Peggy, MD  levETIRAcetam (KEPPRA) 500 MG tablet Take 1 tablet (500 mg total) by mouth 2 (two) times daily. 11/04/16  Yes Constant, Peggy, MD  oxyCODONE-acetaminophen (PERCOCET/ROXICET) 5-325 MG tablet Take 1 tablet by mouth every 4 (four) hours as needed for severe pain.   Yes [provider]  Prenatal Vit-Fe Fumarate-FA (PRENATAL MULTIVITAMIN) TABS tablet Take 1 tablet by mouth daily at 12 noon. 11/05/16  Yes Constant, Peggy, MD    Family History Family History  Problem Relation Age of Onset  . Hypertension Other   . Diabetes Other   . Epilepsy Other     Social History Social History   Tobacco Use  . Smoking status: Former Smoker    Types: Cigarettes  . Smokeless tobacco: Never Used  Substance Use Topics  . Alcohol use: Yes    Comment: occ. none since +pregnancy test.  . Drug use: No     Allergies   Amoxicillin and Penicillins   Review of Systems Review of Systems  All other systems reviewed and are negative.    Physical Exam Updated Vital Signs BP 106/68   Pulse 95   Temp 98.1 F (36.7 C) (Oral)  Resp 18   Ht 5\' 6"  (1.676 m)   Wt 100.7 kg   LMP 04/17/2018   SpO2 95%   BMI 35.83 kg/m   Physical Exam  Constitutional: She is oriented to person, place, and time. She appears well-developed and well-nourished.  HENT:  Head: Normocephalic and atraumatic.  Eyes: Conjunctivae are normal.  Neck: Neck supple.  Cardiovascular: Normal rate and regular rhythm.  Pulmonary/Chest: Effort normal and breath sounds normal.  Abdominal: Soft. Bowel sounds are normal.  Musculoskeletal:  Minimal tenderness para lower lumbar spine  Neurological: She is alert and oriented to person, place, and time.  Skin: Skin is warm and dry.  Bilateral lower extremities slightly edematous left greater than right.  No posterior thigh or calf tenderness.  Psychiatric: She has a normal mood and affect. Her behavior is normal.  Nursing note and vitals  reviewed.    ED Treatments / Results  Labs (all labs ordered are listed, but only abnormal results are displayed) Labs Reviewed  COMPREHENSIVE METABOLIC PANEL - Abnormal; Notable for the following components:      Result Value   Potassium 3.2 (*)    Glucose, Bld 116 (*)    Creatinine, Ser 1.09 (*)    All other components within normal limits  URINALYSIS, ROUTINE W REFLEX MICROSCOPIC - Abnormal; Notable for the following components:   APPearance HAZY (*)    Ketones, ur 20 (*)    Nitrite POSITIVE (*)    Bacteria, UA MANY (*)    All other components within normal limits  CBC WITH DIFFERENTIAL/PLATELET  PREGNANCY, URINE    EKG None  Radiology No results found.  Procedures Procedures (including critical care time)  Medications Ordered in ED Medications  traMADol (ULTRAM) tablet 50 mg (50 mg Oral Given 05/01/18 2019)  enoxaparin (LOVENOX) injection 100 mg (100 mg Subcutaneous Given 05/01/18 2108)     Initial Impression / Assessment and Plan / ED Course  I have reviewed the triage vital signs and the nursing notes.  Pertinent labs & imaging results that were available during my care of the patient were reviewed by me and considered in my medical decision making (see chart for details).     Patient presents with lower extremity edema left greater than right.  No obvious posterior thigh or calf tenderness.  Screening labs including CBC, BMET, pregnancy negative for acute findings.  Will obtain bilateral lower extremity ultrasound Monday morning to rule out DVT.  Lovenox 1 mg/kg subcu given.  Final Clinical Impressions(s) / ED Diagnoses   Final diagnoses:  Bilateral lower extremity edema  Low back pain without sciatica, unspecified back pain laterality, unspecified chronicity    ED Discharge Orders         Ordered    US Venous Img Lower Bilateral     05/01/18 2119           Donnetta Hutching, MD 05/01/18 2135

## 2018-05-02 ENCOUNTER — Ambulatory Visit (HOSPITAL_COMMUNITY): Admit: 2018-05-02 | Payer: Medicaid Other

## 2018-08-29 ENCOUNTER — Encounter (HOSPITAL_COMMUNITY): Payer: Self-pay | Admitting: *Deleted

## 2018-08-29 ENCOUNTER — Emergency Department (HOSPITAL_COMMUNITY)
Admission: EM | Admit: 2018-08-29 | Discharge: 2018-08-29 | Disposition: A | Discharge status: Home / Self Care | Payer: Medicaid Other | Attending: Emergency Medicine | Admitting: Emergency Medicine

## 2018-08-29 ENCOUNTER — Other Ambulatory Visit: Payer: Self-pay

## 2018-08-29 DIAGNOSIS — O99331 Smoking (tobacco) complicating pregnancy, first trimester: Secondary | ICD-10-CM | POA: Diagnosis not present

## 2018-08-29 DIAGNOSIS — Z3A14 14 weeks gestation of pregnancy: Secondary | ICD-10-CM | POA: Insufficient documentation

## 2018-08-29 DIAGNOSIS — O26891 Other specified pregnancy related conditions, first trimester: Secondary | ICD-10-CM | POA: Diagnosis not present

## 2018-08-29 DIAGNOSIS — R1030 Lower abdominal pain, unspecified: Secondary | ICD-10-CM | POA: Insufficient documentation

## 2018-08-29 DIAGNOSIS — F17210 Nicotine dependence, cigarettes, uncomplicated: Secondary | ICD-10-CM | POA: Diagnosis not present

## 2018-08-29 DIAGNOSIS — Z79899 Other long term (current) drug therapy: Secondary | ICD-10-CM | POA: Diagnosis not present

## 2018-08-29 DIAGNOSIS — O9989 Other specified diseases and conditions complicating pregnancy, childbirth and the puerperium: Secondary | ICD-10-CM | POA: Diagnosis present

## 2018-08-29 DIAGNOSIS — R350 Frequency of micturition: Secondary | ICD-10-CM

## 2018-08-29 LAB — URINALYSIS, ROUTINE W REFLEX MICROSCOPIC
Bilirubin Urine: NEGATIVE
GLUCOSE, UA: NEGATIVE mg/dL
KETONES UR: NEGATIVE mg/dL
Nitrite: NEGATIVE
PH: 6 (ref 5.0–8.0)
Protein, ur: NEGATIVE mg/dL
Specific Gravity, Urine: 1.011 (ref 1.005–1.030)

## 2018-08-29 LAB — HCG, QUANTITATIVE, PREGNANCY: hCG, Beta Chain, Quant, S: 16742 m[IU]/mL — ABNORMAL HIGH (ref <5)

## 2018-08-29 LAB — POC URINE PREG, ED: Preg Test, Ur: POSITIVE — AB

## 2018-08-29 MED ORDER — HYDROCODONE-ACETAMINOPHEN 5-325 MG PO TABS
1.0000 | ORAL_TABLET | Freq: Once | ORAL | Status: AC
  Administered 2018-08-29: 1 via ORAL
  Filled 2018-08-29: qty 1

## 2018-08-29 NOTE — ED Provider Notes (Signed)
[facility] EMERGENCY DEPARTMENT Provider Note   CSN: [id number] Arrival date & time: 08/29/18  7209    History   Chief Complaint Chief Complaint  Patient presents with  . Abdominal Pain    HPI Chelsey Sullivan is a 33 y.o. female.  The history is provided by the patient.  Abdominal Pain   This is a new problem. The current episode started 2 days ago. The problem occurs daily. The problem has been gradually worsening. The pain is located in the suprapubic region. The quality of the pain is sharp. The pain is mild. Associated symptoms include diarrhea, nausea and vomiting. Pertinent negatives include fever and dysuria. Nothing aggravates the symptoms. Nothing relieves the symptoms.   Presents with lower abdominal pain for 2 days.  She also reports low back pain as well.  She reports urinary frequency.  She also reports associated vomiting and diarrhea No vaginal bleeding or discharge. She is unsure of her last menstrual cycle.  She reports history of seizures but is unsure when her last seizure occurred Past Medical History:  Diagnosis Date  . Depression   . Epilepsy [facility])     Patient Active Problem List   Diagnosis Date Noted  . Anemia, iron deficiency 11/04/2016  . Headache 11/04/2016  . Weakness 11/04/2016  . SOB (shortness of breath) 11/04/2016  . Seizure disorder during pregnancy in third trimester (HCC) 11/03/2016  . 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 Patient noncompliance 11/03/2016  . Abnormal glucose affecting pregnancy 11/03/2016    Past Surgical History:  Procedure Laterality Date  . HERNIA REPAIR     umbilical hernia repair x 2.     OB History    Gravida  5   Para  3   Term  2   Preterm      AB      Living  4     SAB      TAB      Ectopic      Multiple      Live Births  4            Home Medications    Prior to Admission medications   Medication Sig Start Date End Date Taking? Authorizing  Provider  albuterol (PROVENTIL HFA;VENTOLIN HFA) 108 (90 BASE) MCG/ACT inhaler Inhale 2 puffs into the lungs every 6 (six) hours as needed for wheezing or shortness of breath.    [provider]  citalopram (CELEXA) 20 MG tablet Take 20 mg by mouth daily.    [provider]  levETIRAcetam (KEPPRA) 500 MG tablet Take 1 tablet (500 mg total) by mouth 2 (two) times daily. 11/04/16   Constant, Peggy, MD  Prenatal Vit-Fe Fumarate-FA (PRENATAL MULTIVITAMIN) TABS tablet Take 1 tablet by mouth daily at 12 noon. 11/05/16   Constant, Peggy, MD    Family History Family History  Problem Relation Age of Onset  . Hypertension Other   . Diabetes Other   . Epilepsy Other     Social History Social History   Tobacco Use  . Smoking status: Current Every Day Smoker    Packs/day: 1.00    Types: Cigarettes  . Smokeless tobacco: Never Used  Substance Use Topics  . Alcohol use: Yes    Comment: occ. none since +pregnancy test.  . Drug use: No     Allergies   Amoxicillin and Penicillins   Review of Systems Review of Systems  Constitutional: Negative for fever.  Gastrointestinal: Positive for  abdominal pain, diarrhea, nausea and vomiting.  Genitourinary: Negative for dysuria, vaginal bleeding and vaginal discharge.  All other systems reviewed and are negative.    Physical Exam Updated Vital Signs BP 107/67 (BP Location: Right Arm)   Pulse 79   Temp 97.8 F (36.6 C) (Oral)   Resp 16   Ht 1.753 m (5\' 9" )   Wt 100.7 kg   SpO2 99%   BMI 32.78 kg/m   Physical Exam CONSTITUTIONAL: Well developed/well nourished HEAD: Normocephalic/atraumatic EYES: EOMI/PERRL ENMT: Mucous membranes moist NECK: supple no meningeal signs SPINE/BACK:entire spine nontender CV: S1/S2 noted, no murmurs/rubs/gallops noted LUNGS: Lungs are clear to auscultation bilaterally, no apparent distress ABDOMEN: soft, nontender, no rebound or guarding, bowel sounds noted throughout abdomen GU:no cva  tenderness NEURO: Pt is awake/alert/appropriate, moves all extremitiesx4.  No facial droop.   EXTREMITIES: pulses normal/equal, full ROM SKIN: warm, color normal PSYCH: Flat affect  ED Treatments / Results  Labs (all labs ordered are listed, but only abnormal results are displayed) Labs Reviewed  URINALYSIS, ROUTINE W REFLEX MICROSCOPIC - Abnormal; Notable for the following components:      Result Value   APPearance CLOUDY (*)    Hgb urine dipstick SMALL (*)    Leukocytes, UA SMALL (*)    Bacteria, UA RARE (*)    All other components within normal limits  POC URINE PREG, ED - Abnormal; Notable for the following components:   Preg Test, Ur POSITIVE (*)    All other components within normal limits  URINE CULTURE  HCG, QUANTITATIVE, PREGNANCY    EKG None  Radiology No results found.  Procedures Ultrasound ED OB Pelvic Date/Time: 08/29/2018 6:34 AM Performed by: Zadie RhineWickline, Zabdiel Dripps, MD Authorized by: Zadie RhineWickline, Alayasia Breeding, MD   Procedure details:    Indications: evaluate for IUP     Assess:  Intrauterine pregnancy   Technique:  Transabdominal obstetric (HCG+) exam   Images: archived   Study Limitations: body habitus and patient compliance Uterine findings:    Single gestation: identified     Fetal heart rate: identified     Estimated gestational age: 4 weeks Left ovary findings:    Left ovary:  Unable to visualize    Right ovary findings:     Right ovary:  Unable to visualize    Other findings:    Free pelvic fluid: not identified   Comments:     Patient with unknown dates as did not know LMP Fetal movement noted    Medications Ordered in ED Medications  HYDROcodone-acetaminophen (NORCO/VICODIN) 5-325 MG per tablet 1 tablet (1 tablet Oral Given 08/29/18 0512)     Initial Impression / Assessment and Plan / ED Course  I have reviewed the triage vital signs and the nursing notes.  Pertinent labs  results that were available during my care of the patient were  reviewed by me and considered in my medical decision making (see chart for details).     6:35 AM Patient presented for abdominal pain and back pain.  Patient very poor historian had few details including did not know when her LMP occurred. Her abdominal exam was unremarkable.  She denies any vaginal complaints Urine pregnancy was positive.  Patient was unaware that she was pregnant. Limited bedside ultrasound reveals pregnancy is approximately 13-14 weeks We reviewed her medications.  She confirms she is taking Celexa and Keppra but no other meds. She will continue these meds for now, and she will need to start prenatal vitamins. She has been counseled on stopping  smoking. 6:58 AM No convincing signs of UTI.  Urine culture has been sent.  Patient has been instructed to call her OB/GYN today for close follow-up. No Other signs of acute abdominal emergency. Defer pelvic exam at this time as patient has no vaginal bleeding or discharge  Final Clinical Impressions(s) / ED Diagnoses   Final diagnoses:  [redacted] weeks gestation of pregnancy  Urinary frequency    ED Discharge Orders    None       Zadie RhineWickline, Olga Seyler, MD 08/29/18 [phone]

## 2018-08-29 NOTE — ED Triage Notes (Signed)
Pt c/o lower abdominal pain x 2 days; pt c/o urinary frequency; pt also c/o lower back pain

## 2018-08-29 NOTE — ED Notes (Signed)
Asked pt again for urine specimen. Pt responded "I will try to get it in a minute."

## 2018-08-29 NOTE — Discharge Instructions (Addendum)
Please see your OB/GYN this week. Please stop smoking

## 2018-08-29 NOTE — ED Provider Notes (Signed)
Advised f/u with OBGYN She reports continued back pain but I am unable to prescribe pain meds at this time She is ambulatory Likely MSK pain Advised APAP and heating pad    Zadie Rhine, MD 08/29/18 [phone]

## 2018-08-29 NOTE — ED Notes (Signed)
Pt aware of need for urine specimen. 

## 2018-08-30 LAB — URINE CULTURE

## 2018-09-02 ENCOUNTER — Emergency Department (HOSPITAL_COMMUNITY)
Admission: EM | Admit: 2018-09-02 | Discharge: 2018-09-02 | Disposition: A | Discharge status: Home / Self Care | Payer: Medicaid Other | Attending: Emergency Medicine | Admitting: Emergency Medicine

## 2018-09-02 ENCOUNTER — Other Ambulatory Visit: Payer: Self-pay

## 2018-09-02 ENCOUNTER — Encounter (HOSPITAL_COMMUNITY): Payer: Self-pay | Admitting: Emergency Medicine

## 2018-09-02 DIAGNOSIS — K0889 Other specified disorders of teeth and supporting structures: Secondary | ICD-10-CM | POA: Insufficient documentation

## 2018-09-02 DIAGNOSIS — O9989 Other specified diseases and conditions complicating pregnancy, childbirth and the puerperium: Secondary | ICD-10-CM | POA: Insufficient documentation

## 2018-09-02 DIAGNOSIS — Z79899 Other long term (current) drug therapy: Secondary | ICD-10-CM | POA: Insufficient documentation

## 2018-09-02 DIAGNOSIS — Z3491 Encounter for supervision of normal pregnancy, unspecified, first trimester: Secondary | ICD-10-CM

## 2018-09-02 DIAGNOSIS — O99331 Smoking (tobacco) complicating pregnancy, first trimester: Secondary | ICD-10-CM | POA: Diagnosis not present

## 2018-09-02 DIAGNOSIS — F17210 Nicotine dependence, cigarettes, uncomplicated: Secondary | ICD-10-CM | POA: Diagnosis not present

## 2018-09-02 DIAGNOSIS — Z3A14 14 weeks gestation of pregnancy: Secondary | ICD-10-CM | POA: Insufficient documentation

## 2018-09-02 MED ORDER — HYDROCODONE-ACETAMINOPHEN 5-325 MG PO TABS: 1.0000 | ORAL_TABLET | ORAL | 0 refills | Status: DC | PRN

## 2018-09-02 MED ORDER — HYDROCODONE-ACETAMINOPHEN 5-325 MG PO TABS
1.0000 | ORAL_TABLET | Freq: Once | ORAL | Status: AC
  Administered 2018-09-02: 1 via ORAL
  Filled 2018-09-02: qty 1

## 2018-09-02 MED ORDER — CLINDAMYCIN HCL 150 MG PO CAPS
150.0000 mg | ORAL_CAPSULE | Freq: Once | ORAL | Status: AC
  Administered 2018-09-02: 150 mg via ORAL
  Filled 2018-09-02: qty 1

## 2018-09-02 MED ORDER — CLINDAMYCIN HCL 150 MG PO CAPS: 150.0000 mg | ORAL_CAPSULE | Freq: Four times a day (QID) | ORAL | 0 refills | Status: DC

## 2018-09-02 NOTE — ED Triage Notes (Signed)
Pt C/O right sided dental pain that started "a few days ago."

## 2018-09-02 NOTE — Discharge Instructions (Addendum)
Make sure to brush your teeth and floss every day.  Do not take tramadol - it can increase your risk of having a seizure.

## 2018-09-02 NOTE — ED Provider Notes (Addendum)
[facility] EMERGENCY DEPARTMENT Provider Note   CSN: [id number] Arrival date & time: 09/02/18  0009     History   Chief Complaint Chief Complaint  Patient presents with  . Dental Pain    HPI Chelsey Sullivan is a 33 y.o. female.  The history is provided by the patient.  Dental Pain  She has history of depression and epilepsy, and complains of dental pain for the last 3 days.  Pain is on the right side and involving both upper and lower teeth.  She cannot localize it to 1 or 2 teeth.  She has been taking acetaminophen, ibuprofen, tramadol without relief.  Pain is rated at 9/10.  It is worse when she tries to eat something cold.  She does not have a dentist.  Also, she was recently diagnosed as pregnant, currently about 14-1/[redacted] weeks gestation.  She is gravida 7, para 6  Past Medical History:  Diagnosis Date  . Depression   . Epilepsy [facility])     Patient Active Problem List   Diagnosis Date Noted  . Anemia, iron deficiency 11/04/2016  . Headache 11/04/2016  . Weakness 11/04/2016  . SOB (shortness of breath) 11/04/2016  . Seizure disorder during pregnancy in third trimester (HCC) 11/03/2016  . 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 Patient noncompliance 11/03/2016  . Abnormal glucose affecting pregnancy 11/03/2016    Past Surgical History:  Procedure Laterality Date  . HERNIA REPAIR     umbilical hernia repair x 2.     OB History    Gravida  5   Para  3   Term  2   Preterm      AB      Living  4     SAB      TAB      Ectopic      Multiple      Live Births  4            Home Medications    Prior to Admission medications   Medication Sig Start Date End Date Taking? Authorizing Provider  albuterol (PROVENTIL HFA;VENTOLIN HFA) 108 (90 BASE) MCG/ACT inhaler Inhale 2 puffs into the lungs every 6 (six) hours as needed for wheezing or shortness of breath.    [provider]  citalopram (CELEXA) 20 MG tablet  Take 20 mg by mouth daily.    [provider]  levETIRAcetam (KEPPRA) 500 MG tablet Take 1 tablet (500 mg total) by mouth 2 (two) times daily. 11/04/16   Constant, Peggy, MD  Prenatal Vit-Fe Fumarate-FA (PRENATAL MULTIVITAMIN) TABS tablet Take 1 tablet by mouth daily at 12 noon. 11/05/16   Constant, Peggy, MD    Family History Family History  Problem Relation Age of Onset  . Hypertension Other   . Diabetes Other   . Epilepsy Other     Social History Social History   Tobacco Use  . Smoking status: Current Every Day Smoker    Packs/day: 1.00    Types: Cigarettes  . Smokeless tobacco: Never Used  Substance Use Topics  . Alcohol use: Yes    Comment: occ. none since +pregnancy test.  . Drug use: No     Allergies   Amoxicillin and Penicillins   Review of Systems Review of Systems  All other systems reviewed and are negative.    Physical Exam Updated Vital Signs BP 99/62 (BP Location: Right Arm)   Pulse 74   Temp 97.9 F (36.6 C) (Oral)  Resp 14   SpO2 99%   Physical Exam Vitals signs and nursing note reviewed.    33 year old female, resting comfortably and in no acute distress. Vital signs are normal. Oxygen saturation is 99%, which is normal. Head is normocephalic and atraumatic. PERRLA, EOMI. Oropharynx is clear.  No obvious dental caries, but marked tenderness of the gingiva on the right side both upper and lower with some friability noted.  Tenderness to percussion of teeth #4, 5, 6, 7, 26, 27, 28, 29, 30.  No gingival swelling, erythema, pallor. Neck is nontender and supple without adenopathy or JVD. Back is nontender and there is no CVA tenderness. Lungs are clear without rales, wheezes, or rhonchi. Chest is nontender. Heart has regular rate and rhythm without murmur. Abdomen is soft, flat, nontender without masses or hepatosplenomegaly and peristalsis is normoactive. Extremities have no cyanosis or edema, full range of motion is present. Skin is  warm and dry without rash. Neurologic: Mental status is normal, cranial nerves are intact, there are no motor or sensory deficits.  ED Treatments / Results   Procedures Procedures   Medications Ordered in ED Medications  clindamycin (CLEOCIN) capsule 150 mg (150 mg Oral Given 09/02/18 0048)  HYDROcodone-acetaminophen (NORCO/VICODIN) 5-325 MG per tablet 1 tablet (1 tablet Oral Given 09/02/18 0048)     Initial Impression / Assessment and Plan / ED Course  I have reviewed the triage vital signs and the nursing notes.  Dental pain without obvious dental caries or abscess.  First trimester pregnancy.  Patient has history of seizure disorder, advised not to take tramadol.  She is penicillin allergic, she is given a prescription for clindamycin and a small number of hydrocodone-acetaminophen tablets.  Prior to this prescription, her record on the [place] controlled substance reporting website was reviewed, and she has not had any narcotic prescriptions in the last year.  Given dental resource page.  Advised to make sure that she pursues good dental hygiene including brushing and flossing.  Old records are reviewed confirming ED visit 4 days ago with diagnosis of pregnancy [redacted] weeks gestation.  Final Clinical Impressions(s) / ED Diagnoses   Final diagnoses:  Pain, dental  First trimester pregnancy    ED Discharge Orders         Ordered    clindamycin (CLEOCIN) 150 MG capsule  Every 6 hours     09/02/18 0048    HYDROcodone-acetaminophen (NORCO) 5-325 MG tablet  Every 4 hours PRN     09/02/18 0048    HYDROcodone-acetaminophen (NORCO) 5-325 MG tablet  Every 4 hours PRN     09/02/18 0048           Dione Booze, MD 09/02/18 0623    Dione Booze, MD 09/02/18 [phone]

## 2018-09-05 MED FILL — Hydrocodone-Acetaminophen Tab 5-325 MG: ORAL | Qty: 6 | Status: AC

## 2018-09-12 ENCOUNTER — Emergency Department (HOSPITAL_COMMUNITY)
Admission: EM | Admit: 2018-09-12 | Discharge: 2018-09-12 | Disposition: A | Discharge status: Home / Self Care | Payer: Medicaid Other | Attending: Emergency Medicine | Admitting: Emergency Medicine

## 2018-09-12 ENCOUNTER — Encounter (HOSPITAL_COMMUNITY): Payer: Self-pay | Admitting: *Deleted

## 2018-09-12 ENCOUNTER — Other Ambulatory Visit: Payer: Self-pay

## 2018-09-12 DIAGNOSIS — F17210 Nicotine dependence, cigarettes, uncomplicated: Secondary | ICD-10-CM | POA: Insufficient documentation

## 2018-09-12 DIAGNOSIS — R1084 Generalized abdominal pain: Secondary | ICD-10-CM | POA: Diagnosis not present

## 2018-09-12 DIAGNOSIS — K0889 Other specified disorders of teeth and supporting structures: Secondary | ICD-10-CM | POA: Insufficient documentation

## 2018-09-12 DIAGNOSIS — Z79899 Other long term (current) drug therapy: Secondary | ICD-10-CM | POA: Insufficient documentation

## 2018-09-12 DIAGNOSIS — R1011 Right upper quadrant pain: Secondary | ICD-10-CM

## 2018-09-12 LAB — CBC WITH DIFFERENTIAL/PLATELET
Abs Immature Granulocytes: 0.01 10*3/uL (ref 0.00–0.07)
Basophils Absolute: 0 10*3/uL (ref 0.0–0.1)
Basophils Relative: 0 %
EOS ABS: 0.1 10*3/uL (ref 0.0–0.5)
Eosinophils Relative: 1 %
HCT: 34.6 % — ABNORMAL LOW (ref 36.0–46.0)
Hemoglobin: 10.7 g/dL — ABNORMAL LOW (ref 12.0–15.0)
IMMATURE GRANULOCYTES: 0 %
Lymphocytes Relative: 26 %
Lymphs Abs: 2 10*3/uL (ref 0.7–4.0)
MCH: 28.7 pg (ref 26.0–34.0)
MCHC: 30.9 g/dL (ref 30.0–36.0)
MCV: 92.8 fL (ref 80.0–100.0)
Monocytes Absolute: 0.4 10*3/uL (ref 0.1–1.0)
Monocytes Relative: 5 %
Neutro Abs: 5.2 10*3/uL (ref 1.7–7.7)
Neutrophils Relative %: 68 %
Platelets: 207 10*3/uL (ref 150–400)
RBC: 3.73 MIL/uL — ABNORMAL LOW (ref 3.87–5.11)
RDW: 13.5 % (ref 11.5–15.5)
WBC: 7.8 10*3/uL (ref 4.0–10.5)
nRBC: 0 % (ref 0.0–0.2)

## 2018-09-12 LAB — COMPREHENSIVE METABOLIC PANEL
ALT: 11 U/L (ref 0–44)
AST: 13 U/L — ABNORMAL LOW (ref 15–41)
Albumin: 3 g/dL — ABNORMAL LOW (ref 3.5–5.0)
Alkaline Phosphatase: 41 U/L (ref 38–126)
Anion gap: 7 (ref 5–15)
BILIRUBIN TOTAL: 0.4 mg/dL (ref 0.3–1.2)
BUN: 12 mg/dL (ref 6–20)
CO2: 22 mmol/L (ref 22–32)
Calcium: 8.5 mg/dL — ABNORMAL LOW (ref 8.9–10.3)
Chloride: 105 mmol/L (ref 98–111)
Creatinine, Ser: 0.96 mg/dL (ref 0.44–1.00)
GFR calc Af Amer: >60 mL/min (ref >60)
GFR calc non Af Amer: >60 mL/min (ref >60)
Glucose, Bld: 91 mg/dL (ref 70–99)
POTASSIUM: 3.6 mmol/L (ref 3.5–5.1)
Sodium: 134 mmol/L — ABNORMAL LOW (ref 135–145)
TOTAL PROTEIN: 6.6 g/dL (ref 6.5–8.1)

## 2018-09-12 LAB — URINALYSIS, ROUTINE W REFLEX MICROSCOPIC
Bilirubin Urine: NEGATIVE
Glucose, UA: NEGATIVE mg/dL
Hgb urine dipstick: NEGATIVE
KETONES UR: 5 mg/dL — AB
Leukocytes, UA: NEGATIVE
Nitrite: NEGATIVE
Protein, ur: NEGATIVE mg/dL
Specific Gravity, Urine: 1.015 (ref 1.005–1.030)
pH: 6 (ref 5.0–8.0)

## 2018-09-12 LAB — HCG, QUANTITATIVE, PREGNANCY: hCG, Beta Chain, Quant, S: 11454 m[IU]/mL — ABNORMAL HIGH (ref <5)

## 2018-09-12 LAB — LIPASE, BLOOD: LIPASE: 28 U/L (ref 11–51)

## 2018-09-12 MED ORDER — ONDANSETRON 8 MG PO TBDP
8.0000 mg | ORAL_TABLET | Freq: Once | ORAL | Status: AC
  Administered 2018-09-12: 8 mg via ORAL
  Filled 2018-09-12: qty 1

## 2018-09-12 MED ORDER — HYDROCODONE-ACETAMINOPHEN 5-325 MG PO TABS
2.0000 | ORAL_TABLET | Freq: Once | ORAL | Status: AC
  Administered 2018-09-12: 2 via ORAL
  Filled 2018-09-12: qty 2

## 2018-09-12 NOTE — Discharge Instructions (Addendum)
Please call [phone] after 7:45 AM to schedule your ultrasound gallbladder

## 2018-09-12 NOTE — ED Provider Notes (Signed)
[facility]PART[facility] Provider Note   CSN: [id number] Arrival date & time: 09/12/18  0041     History   Chief Complaint Chief Complaint  Patient presents with  . Abdominal Pain    HPI Chelsey Sullivan is a 33 y.o. female.  The history is provided by the patient.  Abdominal Pain  Pain location:  RUQ Pain quality: cramping   Pain radiates to:  Does not radiate Pain severity:  Moderate Onset quality:  Gradual Duration:  1 day Timing:  Intermittent Progression:  Worsening Chronicity:  New Relieved by:  Nothing Worsened by:  Palpation Associated symptoms: no dysuria, no fever, no vaginal bleeding, no vaginal discharge and no vomiting    Patient presents for right upper quadrant pain for the past day.  No fevers or vomiting.  She reports it feels similar to prior episodes of her gallbladder.  She also reports dental pain that is not improved with recent clindamycin.  She is also reportedly pregnant, does not appear to have had any prenatal care Past Medical History:  Diagnosis Date  . Depression   . Epilepsy [facility](HCC)     Patient Active Problem List   Diagnosis Date Noted  . Anemia, iron deficiency 11/04/2016  . Headache 11/04/2016  . Weakness 11/04/2016  . SOB (shortness of breath) 11/04/2016  . Seizure disorder during pregnancy in third trimester (HCC) 11/03/2016  . 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 Patient noncompliance 11/03/2016  . Abnormal glucose affecting pregnancy 11/03/2016    Past Surgical History:  Procedure Laterality Date  . HERNIA REPAIR     umbilical hernia repair x 2.     OB History    Gravida  5   Para  3   Term  2   Preterm      AB      Living  4     SAB      TAB      Ectopic      Multiple      Live Births  4            Home Medications    Prior to Admission medications   Medication Sig Start Date End Date Taking? Authorizing Provider  albuterol (PROVENTIL HFA;VENTOLIN  HFA) 108 (90 BASE) MCG/ACT inhaler Inhale 2 puffs into the lungs every 6 (six) hours as needed for wheezing or shortness of breath.    [provider]  citalopram (CELEXA) 20 MG tablet Take 20 mg by mouth daily.    [provider]  clindamycin (CLEOCIN) 150 MG capsule Take 1 capsule (150 mg total) by mouth every 6 (six) hours. 09/02/18   Dione BoozeGlick, David, MD  levETIRAcetam (KEPPRA) 500 MG tablet Take 1 tablet (500 mg total) by mouth 2 (two) times daily. 11/04/16   Constant, Peggy, MD  Prenatal Vit-Fe Fumarate-FA (PRENATAL MULTIVITAMIN) TABS tablet Take 1 tablet by mouth daily at 12 noon. 11/05/16   Constant, Peggy, MD    Family History Family History  Problem Relation Age of Onset  . Hypertension Other   . Diabetes Other   . Epilepsy Other     Social History Social History   Tobacco Use  . Smoking status: Current Every Day Smoker    Packs/day: 1.00    Types: Cigarettes  . Smokeless tobacco: Never Used  Substance Use Topics  . Alcohol use: Yes    Comment: occ. none since +pregnancy test.  . Drug use: No     Allergies  Amoxicillin and Penicillins   Review of Systems Review of Systems  Constitutional: Negative for fever.  Gastrointestinal: Positive for abdominal pain. Negative for vomiting.  Genitourinary: Negative for dysuria, vaginal bleeding and vaginal discharge.  All other systems reviewed and are negative.    Physical Exam Updated Vital Signs BP 115/65   Pulse 81   Temp 98 F (36.7 C) (Oral)   Resp 20   Ht 1.753 m (5\' 9" )   Wt 104.3 kg   LMP 05/24/2018   SpO2 100%   BMI 33.97 kg/m   Physical Exam  CONSTITUTIONAL: Well developed/well nourished HEAD: Normocephalic/atraumatic EYES: EOMI/PERRL, no icterus ENMT: Mucous membranes moist, diffuse dental tenderness without abscess, no trismus NECK: supple no meningeal signs SPINE/BACK:entire spine nontender CV: S1/S2 noted, no murmurs/rubs/gallops noted LUNGS: Lungs are clear to auscultation  bilaterally, no apparent distress ABDOMEN: soft, moderate right upper quadrant tenderness, no rebound or guarding, bowel sounds noted throughout abdomen, obesity noted GU:no cva tenderness NEURO: Pt is awake/alert/appropriate, moves all extremitiesx4.  No facial droop.   EXTREMITIES: pulses normal/equal, full ROM SKIN: warm, color normal PSYCH: no abnormalities of mood noted, alert and oriented to situation  ED Treatments / Results  Labs (all labs ordered are listed, but only abnormal results are displayed) Labs Reviewed  URINALYSIS, ROUTINE W REFLEX MICROSCOPIC - Abnormal; Notable for the following components:      Result Value   Ketones, ur 5 (*)    All other components within normal limits  CBC WITH DIFFERENTIAL/PLATELET - Abnormal; Notable for the following components:   RBC 3.73 (*)    Hemoglobin 10.7 (*)    HCT 34.6 (*)    All other components within normal limits  COMPREHENSIVE METABOLIC PANEL - Abnormal; Notable for the following components:   Sodium 134 (*)    Calcium 8.5 (*)    Albumin 3.0 (*)    AST 13 (*)    All other components within normal limits  HCG, QUANTITATIVE, PREGNANCY - Abnormal; Notable for the following components:   hCG, Beta Chain, Quant, S 11,454 (*)    All other components within normal limits  LIPASE, BLOOD    EKG None  Radiology No results found.  Procedures Procedures    Medications Ordered in ED Medications  ondansetron (ZOFRAN-ODT) disintegrating tablet 8 mg (8 mg Oral Given 09/12/18 0330)  HYDROcodone-acetaminophen (NORCO/VICODIN) 5-325 MG per tablet 2 tablet (2 tablets Oral Given 09/12/18 0330)     Initial Impression / Assessment and Plan / ED Course  I have reviewed the triage vital signs and the nursing notes.  Pertinent labs  results that were available during my care of the patient were reviewed by me and considered in my medical decision making (see chart for details).     Presents for her fourth ER visit in 6 months.  It  does not appear that she has had any prenatal care since finding out she was pregnant earlier this month.  She had an ultrasound on previous visit the documented 14-week pregnancy.  She denies any vaginal complaints or bleeding.  Most of her pain was located in the right upper quadrant.  She is afebrile and in no acute distress.  LFTs and lipase are normal.  Patient is mildly dehydrated. She will need to follow-up with OB/GYN.  She will return later today for a outpatient right quadrant ultrasound as she reports history cholelithiasis  No Other signs of acute abdominal emergency  Given pain medicines here, but will not be given any at  time of discharge Final Clinical Impressions(s) / ED Diagnoses   Final diagnoses:  Right upper quadrant abdominal pain  Pain, dental    ED Discharge Orders    None       Zadie Rhine, MD 09/12/18 [phone]

## 2018-09-12 NOTE — ED Notes (Signed)
Checked on pt for urine sample.pt can't go right now will check back in 30 minutes.

## 2018-09-12 NOTE — ED Triage Notes (Signed)
Pt c/o dental pain that started today along with right side abd pain and nausea.

## 2018-09-28 ENCOUNTER — Encounter (HOSPITAL_COMMUNITY): Payer: Self-pay | Admitting: *Deleted

## 2018-09-28 ENCOUNTER — Emergency Department (HOSPITAL_COMMUNITY)
Admission: EM | Admit: 2018-09-28 | Discharge: 2018-09-28 | Disposition: A | Discharge status: Home / Self Care | Payer: Medicaid Other | Attending: Emergency Medicine | Admitting: Emergency Medicine

## 2018-09-28 ENCOUNTER — Other Ambulatory Visit: Payer: Self-pay

## 2018-09-28 DIAGNOSIS — Z79899 Other long term (current) drug therapy: Secondary | ICD-10-CM | POA: Diagnosis not present

## 2018-09-28 DIAGNOSIS — K0889 Other specified disorders of teeth and supporting structures: Secondary | ICD-10-CM | POA: Insufficient documentation

## 2018-09-28 DIAGNOSIS — F17210 Nicotine dependence, cigarettes, uncomplicated: Secondary | ICD-10-CM | POA: Diagnosis not present

## 2018-09-28 MED ORDER — KETOROLAC TROMETHAMINE 60 MG/2ML IM SOLN
INTRAMUSCULAR | Status: AC
  Filled 2018-09-28: qty 2

## 2018-09-28 MED ORDER — NAPROXEN 500 MG PO TABS: 500.0000 mg | ORAL_TABLET | Freq: Two times a day (BID) | ORAL | 0 refills | Status: DC

## 2018-09-28 MED ORDER — KETOROLAC TROMETHAMINE 60 MG/2ML IM SOLN: 60.0000 mg | Freq: Once | INTRAMUSCULAR | Status: DC

## 2018-09-28 MED ORDER — CLINDAMYCIN HCL 150 MG PO CAPS
ORAL_CAPSULE | ORAL | Status: AC
  Administered 2018-09-28: 150 mg via ORAL
  Filled 2018-09-28: qty 1

## 2018-09-28 MED ORDER — CLINDAMYCIN HCL 150 MG PO CAPS
150.0000 mg | ORAL_CAPSULE | Freq: Once | ORAL | Status: AC
  Administered 2018-09-28: 150 mg via ORAL

## 2018-09-28 MED ORDER — CLINDAMYCIN HCL 150 MG PO CAPS: 300.0000 mg | ORAL_CAPSULE | Freq: Three times a day (TID) | ORAL | 0 refills | Status: DC

## 2018-09-28 NOTE — ED Triage Notes (Signed)
Pt c/o right dental pain that started today on lower teeth; pt states the pain from her teeth is causing a headache

## 2018-09-28 NOTE — ED Provider Notes (Signed)
[facility] EMERGENCY DEPARTMENT Provider Note   CSN: [id number] Arrival date & time: 09/28/18  2001     History   Chief Complaint Chief Complaint  Patient presents with  . Dental Pain    HPI Chelsey Sullivan is a 33 y.o. female.  HPI  The patient complains of a toothache, right lower tooth including the right canine on the lower jaw and the right first premolar.  There is no swelling, no tenderness over the tooth, no swelling of the jaw or the face or the gumline.  No fevers.  She denies any injuries.  Denies any difficulty with fevers.  She does complain of temperature hypersensitivity.  She took both Tylenol and ibuprofen but did not get any significant relief.  This all started today.  Past Medical History:  Diagnosis Date  . Depression   . Epilepsy [facility])     Patient Active Problem List   Diagnosis Date Noted  . Anemia, iron deficiency 11/04/2016  . Headache 11/04/2016  . Weakness 11/04/2016  . SOB (shortness of breath) 11/04/2016  . Seizure disorder during pregnancy in third trimester (HCC) 11/03/2016  . 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 Patient noncompliance 11/03/2016  . Abnormal glucose affecting pregnancy 11/03/2016    Past Surgical History:  Procedure Laterality Date  . HERNIA REPAIR     umbilical hernia repair x 2.     OB History    Gravida  5   Para  3   Term  2   Preterm      AB      Living  4     SAB      TAB      Ectopic      Multiple      Live Births  4            Home Medications    Prior to Admission medications   Medication Sig Start Date End Date Taking? Authorizing Provider  albuterol (PROVENTIL HFA;VENTOLIN HFA) 108 (90 BASE) MCG/ACT inhaler Inhale 2 puffs into the lungs every 6 (six) hours as needed for wheezing or shortness of breath.    [provider]  citalopram (CELEXA) 20 MG tablet Take 20 mg by mouth daily.    [provider]  clindamycin (CLEOCIN) 150  MG capsule Take 2 capsules (300 mg total) by mouth 3 (three) times daily. May dispense as 150mg  capsules 09/28/18   Eber Hong, MD  levETIRAcetam (KEPPRA) 500 MG tablet Take 1 tablet (500 mg total) by mouth 2 (two) times daily. 11/04/16   Constant, Peggy, MD  naproxen (NAPROSYN) 500 MG tablet Take 1 tablet (500 mg total) by mouth 2 (two) times daily with a meal. 09/28/18   Eber Hong, MD  Prenatal Vit-Fe Fumarate-FA (PRENATAL MULTIVITAMIN) TABS tablet Take 1 tablet by mouth daily at 12 noon. 11/05/16   Constant, Peggy, MD    Family History Family History  Problem Relation Age of Onset  . Hypertension Other   . Diabetes Other   . Epilepsy Other     Social History Social History   Tobacco Use  . Smoking status: Current Every Day Smoker    Packs/day: 1.00    Types: Cigarettes  . Smokeless tobacco: Never Used  Substance Use Topics  . Alcohol use: Yes    Comment: occ. none since +pregnancy test.  . Drug use: No     Allergies   Amoxicillin and Penicillins   Review of Systems Review of  Systems  Constitutional: Negative for fever.  HENT: Positive for dental problem.      Physical Exam Updated Vital Signs BP 106/62 (BP Location: Right Arm)   Pulse 63   Temp 98.2 F (36.8 C) (Oral)   Resp 20   Ht 1.753 m (5\' 9" )   Wt 104.3 kg   SpO2 94%   BMI 33.96 kg/m   Physical Exam Vitals signs and nursing note reviewed.  Constitutional:      General: She is not in acute distress.    Appearance: She is well-developed. She is not diaphoretic.  HENT:     Head: Normocephalic and atraumatic.     Comments: Dental exam shows that the patient has normal-appearing teeth without any deep cavities any gingival swelling or edema, no tenderness under the tongue, normal phonation, no obvious fractures, no obvious source of the pain.  She has normal neck exam without lymphadenopathy, there is no trismus or torticollis.    Mouth/Throat:     Pharynx: No oropharyngeal exudate.  Eyes:      General: No scleral icterus.    Conjunctiva/sclera: Conjunctivae normal.  Neck:     Musculoskeletal: Normal range of motion and neck supple.     Thyroid: No thyromegaly.  Cardiovascular:     Rate and Rhythm: Normal rate and regular rhythm.  Pulmonary:     Effort: Pulmonary effort is normal.     Breath sounds: Normal breath sounds.  Lymphadenopathy:     Cervical: No cervical adenopathy.  Skin:    General: Skin is warm and dry.     Findings: No rash.  Neurological:     Mental Status: She is alert.      ED Treatments / Results  Labs (all labs ordered are listed, but only abnormal results are displayed) Labs Reviewed - No data to display  EKG None  Radiology No results found.  Procedures Procedures (including critical care time)  Medications Ordered in ED Medications  ketorolac (TORADOL) injection 60 mg (has no administration in time range)  ketorolac (TORADOL) 60 MG/2ML injection (has no administration in time range)     Initial Impression / Assessment and Plan / ED Course  I have reviewed the triage vital signs and the nursing notes.  Pertinent labs & imaging results that were available during my care of the patient were reviewed by me and considered in my medical decision making (see chart for details).     The exam is unremarkable, the patient has some dental pain likely pulpitis, will prescribe an anti-inflammatory with a backup antibiotic if she should develop pain or swelling.  She states that she has a dentist that she will call when she goes home to make a follow-up appointment.  Stable for discharge after Toradol.  Final Clinical Impressions(s) / ED Diagnoses   Final diagnoses:  Pain, dental    ED Discharge Orders         Ordered    naproxen (NAPROSYN) 500 MG tablet  2 times daily with meals     09/28/18 2125    clindamycin (CLEOCIN) 150 MG capsule  3 times daily     09/28/18 2125           Eber Hong, MD 09/28/18 2126

## 2018-09-28 NOTE — ED Notes (Signed)
Pt requesting pain medication. Dr Hyacinth Meeker aware. Advised to continue taking tylenol for pain relief due to pregnancy.

## 2018-09-28 NOTE — Discharge Instructions (Addendum)
Thank you for letting us take care of you today!  Please obtain all of your results from medical records or have your doctors office obtain the results - share them with your doctor - you should be seen at your doctors office in the next 2 days. Call today to arrange your follow up. Take the medications as prescribed. Please review all of the medicines and only take them if you do not have an allergy to them. Please be aware that if you are taking birth control pills, taking other prescriptions, ESPECIALLY ANTIBIOTICS may make the birth control ineffective - if this is the case, either do not engage in sexual activity or use alternative methods of birth control such as condoms until you have finished the medicine and your family doctor says it is OK to restart them. If you are on a blood thinner such as COUMADIN, be aware that any other medicine that you take may cause the coumadin to either work too much, or not enough - you should have your coumadin level rechecked in next 7 days if this is the case.  ?  It is also a possibility that you have an allergic reaction to any of the medicines that you have been prescribed - Everybody reacts differently to medications and while MOST people have no trouble with most medicines, you may have a reaction such as nausea, vomiting, rash, swelling, shortness of breath. If this is the case, please stop taking the medicine immediately and contact your physician.   If you were given a medication in the ED such as percocet, vicodin, or morphine, be aware that these medicines are sedating and may change your ability to take care of yourself adequately for several hours after being given this medicines - you should not drive or take care of small children if you were given this medicine in the Emergency Department or if you have been prescribed these types of medicines. ?   You should return to the ER IMMEDIATELY if you develop severe or worsening symptoms.    Tylenol twice  daily for pain Clindamycin 3 times daily if you have increased pain, swelling or fevers Dentist in 10 days

## 2018-09-29 ENCOUNTER — Encounter (HOSPITAL_COMMUNITY): Payer: Self-pay | Admitting: Emergency Medicine

## 2018-09-29 ENCOUNTER — Emergency Department (HOSPITAL_COMMUNITY)
Admission: EM | Admit: 2018-09-29 | Discharge: 2018-09-29 | Disposition: A | Discharge status: Home / Self Care | Payer: Medicaid Other | Attending: Emergency Medicine | Admitting: Emergency Medicine

## 2018-09-29 ENCOUNTER — Other Ambulatory Visit: Payer: Self-pay

## 2018-09-29 ENCOUNTER — Emergency Department (HOSPITAL_COMMUNITY)
Admission: EM | Admit: 2018-09-29 | Discharge: 2018-09-29 | Disposition: A | Discharge status: Home / Self Care | Payer: Medicaid Other | Source: Home / Self Care | Attending: Emergency Medicine | Admitting: Emergency Medicine

## 2018-09-29 DIAGNOSIS — K0889 Other specified disorders of teeth and supporting structures: Secondary | ICD-10-CM | POA: Diagnosis present

## 2018-09-29 DIAGNOSIS — F17210 Nicotine dependence, cigarettes, uncomplicated: Secondary | ICD-10-CM

## 2018-09-29 DIAGNOSIS — Z79899 Other long term (current) drug therapy: Secondary | ICD-10-CM

## 2018-09-29 MED ORDER — KETOROLAC TROMETHAMINE 60 MG/2ML IM SOLN
60.0000 mg | Freq: Once | INTRAMUSCULAR | Status: AC
  Administered 2018-09-29: 60 mg via INTRAMUSCULAR
  Filled 2018-09-29: qty 2

## 2018-09-29 MED ORDER — CLINDAMYCIN HCL 150 MG PO CAPS: 150.0000 mg | ORAL_CAPSULE | Freq: Four times a day (QID) | ORAL | 0 refills | Status: DC

## 2018-09-29 MED ORDER — DICLOFENAC SODIUM 50 MG PO TBEC: 50.0000 mg | DELAYED_RELEASE_TABLET | Freq: Two times a day (BID) | ORAL | 0 refills | Status: DC

## 2018-09-29 MED ORDER — CLINDAMYCIN HCL 150 MG PO CAPS: 150.0000 mg | ORAL_CAPSULE | Freq: Four times a day (QID) | ORAL | 0 refills | Status: AC

## 2018-09-29 NOTE — Discharge Instructions (Addendum)
Follow-up with a dentist soon.  Be sure to get your prescriptions filled tomorrow.

## 2018-09-29 NOTE — ED Provider Notes (Signed)
[facility] EMERGENCY DEPARTMENT Provider Note   CSN: [id number] Arrival date & time: 09/29/18  1444     History   Chief Complaint Chief Complaint  Patient presents with  . Dental Pain    HPI Chelsey Sullivan is a 33 y.o. female.  The history is provided by the patient. No language interpreter was used.  Dental Pain  Location:  Upper Quality:  Aching Chronicity:  New Relieved by:  Nothing Worsened by:  Nothing Associated symptoms: facial pain     Past Medical History:  Diagnosis Date  . Depression   . Epilepsy [facility])     Patient Active Problem List   Diagnosis Date Noted  . Anemia, iron deficiency 11/04/2016  . Headache 11/04/2016  . Weakness 11/04/2016  . SOB (shortness of breath) 11/04/2016  . Seizure disorder during pregnancy in third trimester (HCC) 11/03/2016  . 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 Patient noncompliance 11/03/2016  . Abnormal glucose affecting pregnancy 11/03/2016    Past Surgical History:  Procedure Laterality Date  . HERNIA REPAIR     umbilical hernia repair x 2.     OB History    Gravida  5   Para  3   Term  2   Preterm      AB      Living  4     SAB      TAB      Ectopic      Multiple      Live Births  4            Home Medications    Prior to Admission medications   Medication Sig Start Date End Date Taking? Authorizing Provider  albuterol (PROVENTIL HFA;VENTOLIN HFA) 108 (90 BASE) MCG/ACT inhaler Inhale 2 puffs into the lungs every 6 (six) hours as needed for wheezing or shortness of breath.    [provider]  citalopram (CELEXA) 20 MG tablet Take 20 mg by mouth daily.    [provider]  levETIRAcetam (KEPPRA) 500 MG tablet Take 1 tablet (500 mg total) by mouth 2 (two) times daily. 11/04/16   Constant, Peggy, MD  naproxen (NAPROSYN) 500 MG tablet Take 1 tablet (500 mg total) by mouth 2 (two) times daily with a meal. 09/28/18   Eber Hong, MD    Prenatal Vit-Fe Fumarate-FA (PRENATAL MULTIVITAMIN) TABS tablet Take 1 tablet by mouth daily at 12 noon. 11/05/16   Constant, Peggy, MD    Family History Family History  Problem Relation Age of Onset  . Hypertension Other   . Diabetes Other   . Epilepsy Other     Social History Social History   Tobacco Use  . Smoking status: Current Every Day Smoker    Packs/day: 1.00    Types: Cigarettes  . Smokeless tobacco: Never Used  Substance Use Topics  . Alcohol use: Yes    Comment: occ. none since +pregnancy test.  . Drug use: No     Allergies   Amoxicillin and Penicillins   Review of Systems Review of Systems  HENT: Positive for dental problem.   All other systems reviewed and are negative.    Physical Exam Updated Vital Signs BP 114/61 (BP Location: Right Arm)   Temp 98.1 F (36.7 C) (Oral)   Resp 20   Ht 5\' 9"  (1.753 m)   Wt 104.3 kg   LMP 09/24/2018   SpO2 100%   BMI 33.97 kg/m   Physical Exam Vitals signs  reviewed.  Constitutional:      Appearance: Normal appearance.  HENT:     Head: Normocephalic.     Nose: Nose normal.     Mouth/Throat:     Mouth: Mucous membranes are moist.  Eyes:     Pupils: Pupils are equal, round, and reactive to light.  Neck:     Musculoskeletal: Normal range of motion.  Cardiovascular:     Rate and Rhythm: Normal rate.  Pulmonary:     Effort: Pulmonary effort is normal.  Skin:    General: Skin is warm.  Neurological:     General: No focal deficit present.     Mental Status: She is alert.  Psychiatric:        Mood and Affect: Mood normal.      ED Treatments / Results  Labs (all labs ordered are listed, but only abnormal results are displayed) Labs Reviewed - No data to display  EKG None  Radiology No results found.  Procedures Procedures (including critical care time)  Medications Ordered in ED Medications - No data to display   Initial Impression / Assessment and Plan / ED Course  I have reviewed  the triage vital signs and the nursing notes.  Pertinent labs & imaging results that were available during my care of the patient were reviewed by me and considered in my medical decision making (see chart for details).     Pt here yesterday. Pt states she is not pregnant.  Pt states she did not get rx for antibiotic yesterday   Final Clinical Impressions(s) / ED Diagnoses   Final diagnoses:  Toothache    ED Discharge Orders         Ordered    clindamycin (CLEOCIN) 150 MG capsule  4 times daily     09/29/18 1612    diclofenac (VOLTAREN) 50 MG EC tablet  2 times daily     09/29/18 1612        An After Visit Summary was printed and given to the patient.    Elson AreasSofia, Leslie K, PA-C 09/29/18 1618    Samuel JesterMcManus, Kathleen, DO 10/01/18 1540

## 2018-09-29 NOTE — ED Triage Notes (Signed)
Pt returns, states she can not get her prescription today, wanting something for pain now.

## 2018-09-29 NOTE — ED Notes (Signed)
Patient states her bottom molar on right side started hurting last night. Pain radiating to top jaw and forehead today.

## 2018-09-29 NOTE — ED Provider Notes (Signed)
[facility] Provider [facility]   CSN: [id number] Arrival date & time: 09/29/18  1743     History   Chief Complaint Chief Complaint  Patient presents with  . Dental Pain    HPI Chelsey Sullivan is a 33 y.o. female.  HPI   Chelsey CarolShanika Vogl is a 33 y.o. female who presents to the Emergency Department for the third time in 2 days, complaining of right upper and lower dental pain.  She was seen here yesterday and given prescriptions for antibiotics and anti-inflammatory.  She was seen again today, she states she has not been able to get her medications filled due to lack of funds.  She states that a family member will get her prescriptions filled tomorrow.  She returns this afternoon requesting something for pain.  She describes a constant throbbing pain to both her upper and lower teeth on the right.  She denies difficulty swallowing, neck pain, fever, chills, facial swelling.  She states she is tried Tylenol and ibuprofen without relief.  Past Medical History:  Diagnosis Date  . Depression   . Epilepsy [facility](HCC)     Patient Active Problem List   Diagnosis Date Noted  . Anemia, iron deficiency 11/04/2016  . Headache 11/04/2016  . Weakness 11/04/2016  . SOB (shortness of breath) 11/04/2016  . Seizure disorder during pregnancy in third trimester (HCC) 11/03/2016  . 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 Patient noncompliance 11/03/2016  . Abnormal glucose affecting pregnancy 11/03/2016    Past Surgical History:  Procedure Laterality Date  . HERNIA REPAIR     umbilical hernia repair x 2.     OB History    Gravida  5   Para  3   Term  2   Preterm      AB      Living  4     SAB      TAB      Ectopic      Multiple      Live Births  4            Home Medications    Prior to Admission medications   Medication Sig Start Date End Date Taking? Authorizing Provider  albuterol (PROVENTIL HFA;VENTOLIN HFA) 108 (90  BASE) MCG/ACT inhaler Inhale 2 puffs into the lungs every 6 (six) hours as needed for wheezing or shortness of breath.    [provider]  citalopram (CELEXA) 20 MG tablet Take 20 mg by mouth daily.    [provider]  clindamycin (CLEOCIN) 150 MG capsule Take 1 capsule (150 mg total) by mouth 4 (four) times daily for 10 days. 09/29/18 10/09/18  Elson AreasSofia, Leslie K, PA-C  diclofenac (VOLTAREN) 50 MG EC tablet Take 1 tablet (50 mg total) by mouth 2 (two) times daily. 09/29/18   Elson AreasSofia, Leslie K, PA-C  levETIRAcetam (KEPPRA) 500 MG tablet Take 1 tablet (500 mg total) by mouth 2 (two) times daily. 11/04/16   Constant, Peggy, MD  naproxen (NAPROSYN) 500 MG tablet Take 1 tablet (500 mg total) by mouth 2 (two) times daily with a meal. 09/28/18   Eber HongMiller, Brian, MD  Prenatal Vit-Fe Fumarate-FA (PRENATAL MULTIVITAMIN) TABS tablet Take 1 tablet by mouth daily at 12 noon. 11/05/16   Constant, Peggy, MD    Family History Family History  Problem Relation Age of Onset  . Hypertension Other   . Diabetes Other   . Epilepsy Other     Social History Social History  Tobacco Use  . Smoking status: Current Every Day Smoker    Packs/day: 1.00    Types: Cigarettes  . Smokeless tobacco: Never Used  Substance Use Topics  . Alcohol use: Yes    Comment: occ. none since +pregnancy test.  . Drug use: No     Allergies   Amoxicillin and Penicillins   Review of Systems Review of Systems  Constitutional: Negative for appetite change and fever.  HENT: Positive for dental problem. Negative for congestion, facial swelling, sore throat and trouble swallowing.   Eyes: Negative for pain and visual disturbance.  Gastrointestinal: Negative for nausea and vomiting.  Musculoskeletal: Negative for neck pain and neck stiffness.  Neurological: Negative for dizziness, facial asymmetry and headaches.  Hematological: Negative for adenopathy.     Physical Exam Updated Vital Signs BP 100/68 (BP Location: Right  Arm)   Pulse 66   Temp 97.7 F (36.5 C) (Oral)   Resp 18   LMP 09/24/2018   SpO2 100%   Physical Exam Vitals signs and nursing note reviewed.  Constitutional:      General: She is not in acute distress.    Appearance: She is well-developed. She is not ill-appearing.  HENT:     Head: Normocephalic.     Jaw: No trismus.     Right Ear: Tympanic membrane and ear canal normal.     Left Ear: Tympanic membrane and ear canal normal.     Mouth/Throat:     Mouth: Mucous membranes are moist.     Dentition: Normal dentition. No dental caries or dental abscesses.     Pharynx: Uvula midline. No uvula swelling.     Comments: Tenderness to palpation of the right upper and lower molars.  No edema or erythema of the surrounding gingiva.  No facial swelling.  No sublingual abnormalities or obvious dental caries Neck:     Musculoskeletal: Normal range of motion and neck supple.  Cardiovascular:     Rate and Rhythm: Normal rate and regular rhythm.     Heart sounds: Normal heart sounds. No murmur.  Pulmonary:     Effort: Pulmonary effort is normal.     Breath sounds: Normal breath sounds.  Musculoskeletal: Normal range of motion.  Lymphadenopathy:     Cervical: No cervical adenopathy.  Skin:    General: Skin is warm and dry.  Neurological:     Mental Status: She is alert. Mental status is at baseline.     Motor: No abnormal muscle tone.     Coordination: Coordination normal.      ED Treatments / Results  Labs (all labs ordered are listed, but only abnormal results are displayed) Labs Reviewed - No data to display  EKG None  Radiology No results found.  Procedures Procedures (including critical care time)  Medications Ordered in ED Medications  ketorolac (TORADOL) injection 60 mg (has no administration in time range)     Initial Impression / Assessment and Plan / ED Course  I have reviewed the triage vital signs and the nursing notes.  Pertinent labs & imaging results that  were available during my care of the patient were reviewed by me and considered in my medical decision making (see chart for details).     Patient with dental pain and multiple ER visits for the same.  She is well-appearing.  There is no concerning symptoms for Ludewig's angina or obvious dental abscess on exam.  She does have prescription for antibiotics and anti-inflammatory pain medication.  She is  requesting pain medication, do not feel that narcotics are warranted and I have explained this to the patient and she is verbalized understanding.  She appears appropriate for discharge home   Final Clinical Impressions(s) / ED Diagnoses   Final diagnoses:  Pain, dental    ED Discharge Orders    None       Pauline Aus, PA-C 09/29/18 1819    Terrilee Files, MD 09/29/18 2344

## 2018-09-29 NOTE — Discharge Instructions (Signed)
You need to see a dentist for evaluation of dental pain

## 2018-09-29 NOTE — ED Triage Notes (Signed)
Onset yesterday right side dental pain.

## 2018-09-29 NOTE — ED Notes (Signed)
Patient requesting medication to hold her over until she can pick up prescription tomorrow.

## 2018-10-09 ENCOUNTER — Emergency Department (HOSPITAL_COMMUNITY)
Admission: EM | Admit: 2018-10-09 | Discharge: 2018-10-09 | Disposition: A | Discharge status: Home / Self Care | Payer: Medicaid Other | Attending: Emergency Medicine | Admitting: Emergency Medicine

## 2018-10-09 ENCOUNTER — Emergency Department (HOSPITAL_COMMUNITY): Payer: Medicaid Other

## 2018-10-09 ENCOUNTER — Other Ambulatory Visit: Payer: Self-pay

## 2018-10-09 ENCOUNTER — Encounter (HOSPITAL_COMMUNITY): Payer: Self-pay | Admitting: Emergency Medicine

## 2018-10-09 DIAGNOSIS — Z79899 Other long term (current) drug therapy: Secondary | ICD-10-CM | POA: Diagnosis not present

## 2018-10-09 DIAGNOSIS — M62830 Muscle spasm of back: Secondary | ICD-10-CM | POA: Diagnosis not present

## 2018-10-09 DIAGNOSIS — F17210 Nicotine dependence, cigarettes, uncomplicated: Secondary | ICD-10-CM | POA: Insufficient documentation

## 2018-10-09 DIAGNOSIS — M546 Pain in thoracic spine: Secondary | ICD-10-CM | POA: Diagnosis present

## 2018-10-09 MED ORDER — CYCLOBENZAPRINE HCL 10 MG PO TABS: 10.0000 mg | ORAL_TABLET | Freq: Three times a day (TID) | ORAL | 0 refills | Status: DC

## 2018-10-09 MED ORDER — KETOROLAC TROMETHAMINE 30 MG/ML IJ SOLN
30.0000 mg | Freq: Once | INTRAMUSCULAR | Status: AC
  Administered 2018-10-09: 30 mg via INTRAMUSCULAR
  Filled 2018-10-09: qty 1

## 2018-10-09 MED ORDER — DIAZEPAM 5 MG PO TABS
10.0000 mg | ORAL_TABLET | Freq: Once | ORAL | Status: AC
  Administered 2018-10-09: 10 mg via ORAL
  Filled 2018-10-09: qty 2

## 2018-10-09 NOTE — Discharge Instructions (Addendum)
Your vital signs are within normal limits.  Your oxygen level is 99% on room air.  Your x-ray is negative for any lung or upper back issue at this time.  Your examination favors muscle spasm in the mid back area.  Heating pad to this area will be helpful.  Please use Flexeril 3 times daily for spasm pain. This medication may cause drowsiness. Please do not drink, drive, or participate in activity that requires concentration while taking this medication.  You may continue your current anti-inflammatory pain medications as they will be helpful with the inflammation portion of this problem.  Please see the physicians at the [facility] clinic, or the [facility] clinic to establish a primary physician.

## 2018-10-09 NOTE — ED Provider Notes (Signed)
[facility] EMERGENCY DEPARTMENT Provider Note   CSN: [id number] Arrival date & time: 10/09/18  1724     History   Chief Complaint Chief Complaint  Patient presents with  . Back Pain    HPI Chelsey Sullivan is a 33 y.o. female.  Patient is a 33 year old female who presents to the emergency department with a complaint of back pain.  The patient states that over the last 4 days she has been having increasing pain under the shoulder blades and extending up to the area of the lower neck.  She does not recall any heavy lifting, pushing, pulling, or straining.  She is not had any recent fall or accident.  No recent fever or chills.  No cough reported. Pt denies pregnancy.  No excessive nausea or vomiting noted.  No recent operations or procedures involving the back or chest.  The patient has not been on any long trips, or had any excessive periods of time of inactivity.  She says that she has had some problems with her back off and on for some time now the pain does not seem to be responding to over-the-counter medication, and she is requesting assistance with this pain.  The history is provided by the patient.  Back Pain  Associated symptoms: no abdominal pain, no chest pain and no dysuria     Past Medical History:  Diagnosis Date  . Depression   . Epilepsy [facility])     Patient Active Problem List   Diagnosis Date Noted  . Anemia, iron deficiency 11/04/2016  . Headache 11/04/2016  . Weakness 11/04/2016  . SOB (shortness of breath) 11/04/2016  . Seizure disorder during pregnancy in third trimester (HCC) 11/03/2016  . 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 Patient noncompliance 11/03/2016  . Abnormal glucose affecting pregnancy 11/03/2016    Past Surgical History:  Procedure Laterality Date  . HERNIA REPAIR     umbilical hernia repair x 2.     OB History    Gravida  5   Para  3   Term  2   Preterm      AB      Living  4     SAB     TAB      Ectopic      Multiple      Live Births  4            Home Medications    Prior to Admission medications   Medication Sig Start Date End Date Taking? Authorizing Provider  albuterol (PROVENTIL HFA;VENTOLIN HFA) 108 (90 BASE) MCG/ACT inhaler Inhale 2 puffs into the lungs every 6 (six) hours as needed for wheezing or shortness of breath.    [provider]  citalopram (CELEXA) 20 MG tablet Take 20 mg by mouth daily.    [provider]  clindamycin (CLEOCIN) 150 MG capsule Take 1 capsule (150 mg total) by mouth 4 (four) times daily for 10 days. 09/29/18 10/09/18  Elson Areas, PA-C  diclofenac (VOLTAREN) 50 MG EC tablet Take 1 tablet (50 mg total) by mouth 2 (two) times daily. 09/29/18   Elson Areas, PA-C  levETIRAcetam (KEPPRA) 500 MG tablet Take 1 tablet (500 mg total) by mouth 2 (two) times daily. 11/04/16   Constant, Peggy, MD  naproxen (NAPROSYN) 500 MG tablet Take 1 tablet (500 mg total) by mouth 2 (two) times daily with a meal. 09/28/18   Eber Hong, MD  Prenatal Vit-Fe Fumarate-FA (PRENATAL  MULTIVITAMIN) TABS tablet Take 1 tablet by mouth daily at 12 noon. 11/05/16   Constant, Peggy, MD    Family History Family History  Problem Relation Age of Onset  . Hypertension Other   . Diabetes Other   . Epilepsy Other     Social History Social History   Tobacco Use  . Smoking status: Current Every Day Smoker    Packs/day: 1.00    Types: Cigarettes  . Smokeless tobacco: Never Used  Substance Use Topics  . Alcohol use: Yes    Comment: occ. none since +pregnancy test.  . Drug use: No     Allergies   Amoxicillin and Penicillins   Review of Systems Review of Systems  Constitutional: Negative for activity change.       All ROS Neg except as noted in HPI  HENT: Negative for nosebleeds.   Eyes: Negative for photophobia and discharge.  Respiratory: Negative for cough, shortness of breath and wheezing.   Cardiovascular: Negative for chest  pain and palpitations.  Gastrointestinal: Negative for abdominal pain and blood in stool.  Genitourinary: Negative for dysuria, frequency and hematuria.  Musculoskeletal: Positive for back pain. Negative for arthralgias and neck pain.  Skin: Negative.   Neurological: Negative for dizziness, seizures and speech difficulty.  Psychiatric/Behavioral: Negative for confusion and hallucinations.     Physical Exam Updated Vital Signs BP (!) 108/55 (BP Location: Right Arm)   Pulse 76   Temp 98.4 F (36.9 C) (Oral)   Resp 16   Ht 5\' 8"  (1.727 m)   Wt 104.3 kg   LMP  (LMP Unknown)   SpO2 99%   BMI 34.97 kg/m   Physical Exam Vitals signs and nursing note reviewed.  Constitutional:      Appearance: She is well-developed. She is not toxic-appearing.  HENT:     Head: Normocephalic.     Right Ear: Tympanic membrane and external ear normal.     Left Ear: Tympanic membrane and external ear normal.  Eyes:     General: Lids are normal.     Pupils: Pupils are equal, round, and reactive to light.  Neck:     Musculoskeletal: Normal range of motion and neck supple.     Vascular: No carotid bruit.  Cardiovascular:     Rate and Rhythm: Normal rate and regular rhythm.     Pulses: Normal pulses.     Heart sounds: Normal heart sounds.  Pulmonary:     Effort: No respiratory distress.     Breath sounds: Normal breath sounds.  Abdominal:     General: Bowel sounds are normal.     Palpations: Abdomen is soft.     Tenderness: There is no abdominal tenderness. There is no guarding.  Musculoskeletal: Normal range of motion.     Thoracic back: She exhibits pain.       Back:  Lymphadenopathy:     Head:     Right side of head: No submandibular adenopathy.     Left side of head: No submandibular adenopathy.     Cervical: No cervical adenopathy.  Skin:    General: Skin is warm and dry.  Neurological:     Mental Status: She is alert and oriented to person, place, and time.     Cranial Nerves: No  cranial nerve deficit.     Sensory: No sensory deficit.  Psychiatric:        Speech: Speech normal.      ED Treatments / Results  Labs (all labs  ordered are listed, but only abnormal results are displayed) Labs Reviewed - No data to display  EKG None  Radiology No results found.  Procedures Procedures (including critical care time)  Medications Ordered in ED Medications  diazepam (VALIUM) tablet 10 mg (has no administration in time range)  ketorolac (TORADOL) 30 MG/ML injection 30 mg (has no administration in time range)     Initial Impression / Assessment and Plan / ED Course  I have reviewed the triage vital signs and the nursing notes.  Pertinent labs & imaging results that were available during my care of the patient were reviewed by me and considered in my medical decision making (see chart for details).      Final Clinical Impressions(s) / ED Diagnoses MDM  Vital signs reviewed.  Patient having 4 days of mid back pain.  X-ray is negative for cardiopulmonary disease.  The examination favors muscle spasm in the mid back area.  Of asked the patient to use a heating pad to the area.  Prescription for Flexeril given.  Patient states she is already using over-the-counter medication and has used some Ultram as well.  I have advised the patient that the Flexeril may cause drowsiness.  Resources for the [facility] as well as the [facility] given to the patient.   Final diagnoses:  Muscle spasm of back    ED Discharge Orders         Ordered    cyclobenzaprine (FLEXERIL) 10 MG tablet  3 times daily     10/09/18 2008           Ivery QualeBryant, Skye Plamondon, Cordelia Poche-C 10/09/18 2014    Mesner, Barbara CowerJason, MD 10/09/18 2038

## 2018-10-09 NOTE — ED Triage Notes (Signed)
Pt c/o of mid back pain x 4 days.  Denies any injuries or urinary symptoms

## 2018-10-28 ENCOUNTER — Other Ambulatory Visit: Payer: Self-pay

## 2018-11-08 ENCOUNTER — Other Ambulatory Visit: Payer: Self-pay | Admitting: Obstetrics & Gynecology

## 2018-11-08 DIAGNOSIS — Z363 Encounter for antenatal screening for malformations: Secondary | ICD-10-CM

## 2018-11-09 ENCOUNTER — Ambulatory Visit (INDEPENDENT_AMBULATORY_CARE_PROVIDER_SITE_OTHER): Payer: Medicaid Other

## 2018-11-09 ENCOUNTER — Other Ambulatory Visit: Payer: Self-pay

## 2018-11-09 DIAGNOSIS — Z363 Encounter for antenatal screening for malformations: Secondary | ICD-10-CM

## 2018-11-09 DIAGNOSIS — Z3A23 23 weeks gestation of pregnancy: Secondary | ICD-10-CM | POA: Diagnosis not present

## 2018-11-09 NOTE — Progress Notes (Signed)
[place] 23+3 wks,breech,posterior placenta gr 0,normal ovaries bilat,EFW 607 g,fhr 142 bpm,cx 3.9 cm,anatomy complete,no obvious abnormalities,EDD 03/05/2019 by today's [place]

## 2018-11-24 ENCOUNTER — Ambulatory Visit: Payer: Medicaid Other | Admitting: *Deleted

## 2018-11-24 ENCOUNTER — Encounter: Payer: Medicaid Other | Admitting: Advanced Practice Midwife

## 2018-11-24 ENCOUNTER — Encounter: Payer: Self-pay | Admitting: Advanced Practice Midwife

## 2018-11-24 ENCOUNTER — Other Ambulatory Visit: Payer: Medicaid Other

## 2018-12-28 ENCOUNTER — Ambulatory Visit: Payer: Medicaid Other | Admitting: *Deleted

## 2019-01-05 ENCOUNTER — Emergency Department (HOSPITAL_COMMUNITY)
Admission: EM | Admit: 2019-01-05 | Discharge: 2019-01-05 | Disposition: A | Discharge status: Home / Self Care | Payer: Medicaid Other | Attending: Emergency Medicine | Admitting: Emergency Medicine

## 2019-01-05 ENCOUNTER — Other Ambulatory Visit: Payer: Self-pay

## 2019-01-05 ENCOUNTER — Encounter (HOSPITAL_COMMUNITY): Payer: Self-pay | Admitting: Emergency Medicine

## 2019-01-05 DIAGNOSIS — X509XXA Other and unspecified overexertion or strenuous movements or postures, initial encounter: Secondary | ICD-10-CM | POA: Diagnosis not present

## 2019-01-05 DIAGNOSIS — S90819A Abrasion, unspecified foot, initial encounter: Secondary | ICD-10-CM

## 2019-01-05 DIAGNOSIS — Y9301 Activity, walking, marching and hiking: Secondary | ICD-10-CM | POA: Insufficient documentation

## 2019-01-05 DIAGNOSIS — F17210 Nicotine dependence, cigarettes, uncomplicated: Secondary | ICD-10-CM | POA: Insufficient documentation

## 2019-01-05 DIAGNOSIS — S90822A Blister (nonthermal), left foot, initial encounter: Secondary | ICD-10-CM

## 2019-01-05 DIAGNOSIS — Y999 Unspecified external cause status: Secondary | ICD-10-CM | POA: Insufficient documentation

## 2019-01-05 DIAGNOSIS — S90812A Abrasion, left foot, initial encounter: Secondary | ICD-10-CM | POA: Insufficient documentation

## 2019-01-05 DIAGNOSIS — Y929 Unspecified place or not applicable: Secondary | ICD-10-CM | POA: Insufficient documentation

## 2019-01-05 DIAGNOSIS — S99922A Unspecified injury of left foot, initial encounter: Secondary | ICD-10-CM | POA: Diagnosis present

## 2019-01-05 DIAGNOSIS — Z79899 Other long term (current) drug therapy: Secondary | ICD-10-CM | POA: Diagnosis not present

## 2019-01-05 MED ORDER — BACITRACIN ZINC 500 UNIT/GM EX OINT
TOPICAL_OINTMENT | Freq: Two times a day (BID) | CUTANEOUS | Status: DC
  Administered 2019-01-05: 1 via TOPICAL
  Filled 2019-01-05: qty 0.9

## 2019-01-05 MED ORDER — ACETAMINOPHEN 325 MG PO TABS
ORAL_TABLET | ORAL | Status: AC
  Filled 2019-01-05: qty 2

## 2019-01-05 MED ORDER — ACETAMINOPHEN 325 MG PO TABS
650.0000 mg | ORAL_TABLET | Freq: Once | ORAL | Status: AC
  Administered 2019-01-05: 650 mg via ORAL

## 2019-01-05 MED ORDER — BACITRACIN-NEOMYCIN-POLYMYXIN 400-5-5000 EX OINT: 1.0000 "application " | TOPICAL_OINTMENT | Freq: Two times a day (BID) | CUTANEOUS | 0 refills | Status: DC

## 2019-01-05 NOTE — ED Provider Notes (Signed)
[facility] EMERGENCY DEPARTMENT Provider Note   CSN: [id number] Arrival date & time: 01/05/19  0042    History   Chief Complaint Chief Complaint  Patient presents with  . Foot Pain    HPI Chelsey Sullivan is a 33 y.o. female.     HPI 33 year old female with history of depression and epilepsy comes in a chief complaint of foot pain. Patient reports that she started having some pain in her left foot yesterday.  Tonight the pain got excruciating, prompting her to come to the ER.  She denies any trauma.  States that she has been walking a lot, and yesterday should not have socks on for a period of time.  The pain is located over the distal part of the foot, primarily in the left tophi region and also over the toes 3 4 and 5.  No associated fevers, chills.  Past Medical History:  Diagnosis Date  . Depression   . Epilepsy [facility])     Patient Active Problem List   Diagnosis Date Noted  . Anemia, iron deficiency 11/04/2016  . Headache 11/04/2016  . Weakness 11/04/2016  . SOB (shortness of breath) 11/04/2016  . Seizure disorder during pregnancy in third trimester (HCC) 11/03/2016  . 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 Patient noncompliance 11/03/2016  . Abnormal glucose affecting pregnancy 11/03/2016    Past Surgical History:  Procedure Laterality Date  . HERNIA REPAIR     umbilical hernia repair x 2.     OB History    Gravida  6   Para  3   Term  2   Preterm      AB      Living  4     SAB      TAB      Ectopic      Multiple      Live Births  4            Home Medications    Prior to Admission medications   Medication Sig Start Date End Date Taking? Authorizing Provider  albuterol (PROVENTIL HFA;VENTOLIN HFA) 108 (90 BASE) MCG/ACT inhaler Inhale 2 puffs into the lungs every 6 (six) hours as needed for wheezing or shortness of breath.    [provider]  citalopram (CELEXA) 20 MG tablet Take 20 mg by  mouth daily.    [provider]  cyclobenzaprine (FLEXERIL) 10 MG tablet Take 1 tablet (10 mg total) by mouth 3 (three) times daily. 10/09/18   Ivery Quale, PA-C  diclofenac (VOLTAREN) 50 MG EC tablet Take 1 tablet (50 mg total) by mouth 2 (two) times daily. 09/29/18   Elson Areas, PA-C  levETIRAcetam (KEPPRA) 500 MG tablet Take 1 tablet (500 mg total) by mouth 2 (two) times daily. 11/04/16   Constant, Peggy, MD  naproxen (NAPROSYN) 500 MG tablet Take 1 tablet (500 mg total) by mouth 2 (two) times daily with a meal. 09/28/18   Eber Hong, MD  Prenatal Vit-Fe Fumarate-FA (PRENATAL MULTIVITAMIN) TABS tablet Take 1 tablet by mouth daily at 12 noon. 11/05/16   Constant, Peggy, MD    Family History Family History  Problem Relation Age of Onset  . Hypertension Other   . Diabetes Other   . Epilepsy Other     Social History Social History   Tobacco Use  . Smoking status: Current Every Day Smoker    Packs/day: 1.00    Types: Cigarettes  . Smokeless tobacco: Never Used  Substance Use Topics  . Alcohol use: Yes    Comment: occ. none since +pregnancy test.  . Drug use: No     Allergies   Amoxicillin and Penicillins   Review of Systems Review of Systems  Constitutional: Positive for activity change. Negative for fever.  Musculoskeletal: Positive for gait problem and myalgias.  Skin: Positive for rash.  Allergic/Immunologic: Negative for immunocompromised state.     Physical Exam Updated Vital Signs BP 109/64 (BP Location: Right Arm)   Pulse 92   Temp 98.1 F (36.7 C) (Oral)   Resp 14   Ht 5\' 11"  (1.803 m)   Wt 111.1 kg   LMP 12/23/2018   SpO2 97%   Breastfeeding Unknown   BMI 34.17 kg/m   Physical Exam Vitals signs and nursing note reviewed.  Constitutional:      Appearance: She is well-developed.  Cardiovascular:     Rate and Rhythm: Normal rate.  Pulmonary:     Effort: Pulmonary effort is normal.  Abdominal:     General: Bowel sounds are normal.   Musculoskeletal:     Comments: Patient has abrasion and superficial skin loss over the left MTP.  Digits 4 and 5 also appears slightly edematous, without erythema, drainage or clear evidence of blister  Skin:    General: Skin is warm and dry.  Neurological:     Mental Status: She is alert and oriented to person, place, and time.      ED Treatments / Results  Labs (all labs ordered are listed, but only abnormal results are displayed) Labs Reviewed - No data to display  EKG None  Radiology No results found.  Procedures Procedures (including critical care time)  Medications Ordered in ED Medications  bacitracin ointment (has no administration in time range)     Initial Impression / Assessment and Plan / ED Course  I have reviewed the triage vital signs and the nursing notes.  Pertinent labs & imaging results that were available during my care of the patient were reviewed by me and considered in my medical decision making (see chart for details).        33 year old comes in a chief complaint of foot pain.  It appears that she has friction related injury due to her walking week without socks.  It seems like she has some loss in skin integrity over the left MTP and hotspots formed over the toes 3 4 and 5.  No signs of infection.  There is no trauma.  X-rays not indicated.  We will treat her with topical bacitracin over the left MTP, and advise limited ambulation.  Final Clinical Impressions(s) / ED Diagnoses   Final diagnoses:  Abrasion, foot w/o infection    ED Discharge Orders    None       Derwood KaplanNanavati, Fabiano Ginley, MD 01/05/19 [phone]

## 2019-01-05 NOTE — ED Triage Notes (Signed)
Pt c/o left foot pain that started yesterday, pt denies injury

## 2019-01-05 NOTE — Discharge Instructions (Signed)
It seems like your injury to the foot is because of increase walking and perhaps not having the right type of shoes or missing socks when you are walking.  Either way, treatment will be to reduce burden on your foot and taking care of the wound site.  Apply the antibiotic ointment and keep the wound covered with a gauze.  Try to limit walking until you have healed properly.  Return to the ER immediately if you start having pus drainage, severe swelling and/or redness to your foot.

## 2019-01-09 ENCOUNTER — Encounter: Payer: Medicaid Other | Admitting: Women's Health

## 2019-01-09 ENCOUNTER — Emergency Department (HOSPITAL_COMMUNITY)
Admission: EM | Admit: 2019-01-09 | Discharge: 2019-01-10 | Disposition: A | Discharge status: Home / Self Care | Payer: Medicaid Other | Attending: Emergency Medicine | Admitting: Emergency Medicine

## 2019-01-09 ENCOUNTER — Other Ambulatory Visit: Payer: Self-pay

## 2019-01-09 ENCOUNTER — Encounter (HOSPITAL_COMMUNITY): Payer: Self-pay | Admitting: Emergency Medicine

## 2019-01-09 DIAGNOSIS — Z3A32 32 weeks gestation of pregnancy: Secondary | ICD-10-CM | POA: Diagnosis not present

## 2019-01-09 DIAGNOSIS — R109 Unspecified abdominal pain: Secondary | ICD-10-CM | POA: Diagnosis not present

## 2019-01-09 DIAGNOSIS — O9989 Other specified diseases and conditions complicating pregnancy, childbirth and the puerperium: Secondary | ICD-10-CM | POA: Insufficient documentation

## 2019-01-09 MED ORDER — ONDANSETRON 4 MG PO TBDP
4.0000 mg | ORAL_TABLET | Freq: Once | ORAL | Status: AC
  Administered 2019-01-09: 4 mg via ORAL
  Filled 2019-01-09: qty 1

## 2019-01-09 NOTE — ED Provider Notes (Signed)
Medical screening exam.  Patient is G7 P6 at 32 weeks.  States she is having abdominal contractions worsening today but is unable to quantify frequency.  Denies loss of fluid or vaginal bleeding.  Patient is on tocometer.  She is in no distress.   Loren Racer, MD 01/09/19 2241

## 2019-01-09 NOTE — ED Triage Notes (Signed)
Pt c/o contractions that have gotten stronger tonight. Pt states she seen ob/gyn today for the same. Pt states all her babies have been premature and this is the 7th pregnancy.

## 2019-01-09 NOTE — Progress Notes (Signed)
Pt is a G6P4 @32wk  1day at [facility] ED, states she is contracting.   Fetal heart tones 145-155bpm, moderate variability, 15x15 accelerations and no decelerations.  No contractions seen on tracing.  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 Recommended readjusting toco and continue monitoring for contractions.   Dr. Debroah Loop notified of above.

## 2019-01-10 LAB — URINALYSIS, ROUTINE W REFLEX MICROSCOPIC
Bacteria, UA: NONE SEEN
Bilirubin Urine: NEGATIVE
Glucose, UA: NEGATIVE mg/dL
Hgb urine dipstick: NEGATIVE
Ketones, ur: NEGATIVE mg/dL
Leukocytes,Ua: NEGATIVE
Nitrite: POSITIVE — AB
Protein, ur: NEGATIVE mg/dL
Specific Gravity, Urine: 1.015 (ref 1.005–1.030)
pH: 7 (ref 5.0–8.0)

## 2019-01-10 MED ORDER — ACETAMINOPHEN 325 MG PO TABS
650.0000 mg | ORAL_TABLET | Freq: Once | ORAL | Status: AC
  Administered 2019-01-10: 650 mg via ORAL
  Filled 2019-01-10: qty 2

## 2019-01-10 NOTE — ED Notes (Signed)
Per rapid response ob nurse pt can come off monitor if EDP is ok with that.

## 2019-01-10 NOTE — Discharge Instructions (Signed)
°  SEEK IMMEDIATE MEDICAL ATTENTION IF: °The pain does not go away or becomes severe, particularly over the next 8-12 hours.  °A temperature above 100.4F develops.  °Repeated vomiting occurs (multiple episodes).  ° °Blood is being passed in stools or vomit (bright red or black tarry stools).  °Return also if you develop chest pain, difficulty breathing, dizziness or fainting, or become confused, poorly responsive, or inconsolable. ° °

## 2019-01-10 NOTE — ED Provider Notes (Signed)
[facility] EMERGENCY DEPARTMENT Provider Note   CSN: [id number] Arrival date & time: 01/09/19  2145    History   Chief Complaint Chief Complaint  Patient presents with  . Labor Eval    HPI Chelsey Sullivan is a 33 y.o. female.     The history is provided by the patient.  Abdominal Cramping  This is a new problem. The current episode started 12 to 24 hours ago. The problem occurs hourly. The problem has been gradually worsening. Associated symptoms include abdominal pain and headaches. Pertinent negatives include no chest pain and no shortness of breath. Nothing aggravates the symptoms. Nothing relieves the symptoms.   Patient is approximately [redacted] weeks pregnant.  This is her 7th pregnancy.  She reports worse abdominal cramping contractions over the past day.  Denies fluid loss or vaginal bleeding.  No Trauma. No chest pain or shortness of breath.  She reports mild headache. She reports increased lower extremity edema Past Medical History:  Diagnosis Date  . Depression   . Epilepsy [facility])     Patient Active Problem List   Diagnosis Date Noted  . Anemia, iron deficiency 11/04/2016  . Headache 11/04/2016  . Weakness 11/04/2016  . SOB (shortness of breath) 11/04/2016  . Seizure disorder during pregnancy in third trimester (HCC) 11/03/2016  . 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 Patient noncompliance 11/03/2016  . Abnormal glucose affecting pregnancy 11/03/2016    Past Surgical History:  Procedure Laterality Date  . HERNIA REPAIR     umbilical hernia repair x 2.     OB History    Gravida  6   Para  3   Term  2   Preterm      AB      Living  4     SAB      TAB      Ectopic      Multiple  1   Live Births  4            Home Medications    Prior to Admission medications   Medication Sig Start Date End Date Taking? Authorizing Provider  albuterol (PROVENTIL HFA;VENTOLIN HFA) 108 (90 BASE) MCG/ACT inhaler Inhale  2 puffs into the lungs every 6 (six) hours as needed for wheezing or shortness of breath.   Yes [provider]  Prenatal Vit-Fe Fumarate-FA (PRENATAL MULTIVITAMIN) TABS tablet Take 1 tablet by mouth daily at 12 noon. 11/05/16  Yes Constant, Peggy, MD  cyclobenzaprine (FLEXERIL) 10 MG tablet Take 1 tablet (10 mg total) by mouth 3 (three) times daily. Patient not taking: Reported on 01/09/2019 10/09/18   Ivery Quale, PA-C  diclofenac (VOLTAREN) 50 MG EC tablet Take 1 tablet (50 mg total) by mouth 2 (two) times daily. Patient not taking: Reported on 01/09/2019 09/29/18   Elson Areas, PA-C  levETIRAcetam (KEPPRA) 500 MG tablet Take 1 tablet (500 mg total) by mouth 2 (two) times daily. Patient not taking: Reported on 01/09/2019 11/04/16   Constant, Peggy, MD  naproxen (NAPROSYN) 500 MG tablet Take 1 tablet (500 mg total) by mouth 2 (two) times daily with a meal. Patient not taking: Reported on 01/09/2019 09/28/18   Eber Hong, MD  neomycin-bacitracin-polymyxin (NEOSPORIN) ointment Apply 1 application topically every 12 (twelve) hours. Patient not taking: Reported on 01/09/2019 01/05/19   Derwood Kaplan, MD    Family History Family History  Problem Relation Age of Onset  . Hypertension Other   . Diabetes Other   .  Epilepsy Other     Social History Social History   Tobacco Use  . Smoking status: Current Every Day Smoker    Packs/day: 1.00    Types: Cigarettes  . Smokeless tobacco: Never Used  Substance Use Topics  . Alcohol use: Yes    Comment: occ. none since +pregnancy test.  . Drug use: No     Allergies   Amoxicillin and Penicillins   Review of Systems Review of Systems  Constitutional: Negative for fever.  Respiratory: Negative for shortness of breath.   Cardiovascular: Positive for leg swelling. Negative for chest pain.  Gastrointestinal: Positive for abdominal pain.  Genitourinary: Negative for dysuria, vaginal bleeding and vaginal discharge.  Neurological:  Positive for headaches.  All other systems reviewed and are negative.    Physical Exam Updated Vital Signs BP 96/60   Pulse 97   Temp 97.9 F (36.6 C) (Oral)   Resp 18   Ht 1.803 m (5\' 11" )   Wt 111.1 kg   LMP 12/23/2018   SpO2 99%   BMI 34.17 kg/m   Physical Exam CONSTITUTIONAL: Well developed/well nourished HEAD: Normocephalic/atraumatic EYES: EOMI/PERRL ENMT: Mucous membranes moist NECK: supple no meningeal signs SPINE/BACK:entire spine nontender CV: S1/S2 noted, no murmurs/rubs/gallops noted LUNGS: Lungs are clear to auscultation bilaterally, no apparent distress ABDOMEN: Gravid soft, nontender, no rebound or guarding, bowel sounds noted throughout abdomen GU:no cva tenderness, no vaginal bleeding, no fluid noted, no signs of fetal products, but patient did not tolerate exam.  Sterile performed with nurse present NEURO: Pt is awake/alert/appropriate, moves all extremitiesx4.  No facial droop.  No clonus EXTREMITIES: pulses normal/equal, full ROM, symmetric edema noted to LE SKIN: warm, color normal PSYCH: no abnormalities of mood noted, alert and oriented to situation   ED Treatments / Results  Labs (all labs ordered are listed, but only abnormal results are displayed) Labs Reviewed  URINALYSIS, ROUTINE W REFLEX MICROSCOPIC - Abnormal; Notable for the following components:      Result Value   Nitrite POSITIVE (*)    All other components within normal limits  URINE CULTURE    EKG None  Radiology No results found.  Procedures Procedures    Medications Ordered in ED Medications  ondansetron (ZOFRAN-ODT) disintegrating tablet 4 mg (4 mg Oral Given 01/09/19 2333)  acetaminophen (TYLENOL) tablet 650 mg (650 mg Oral Given 01/10/19 0027)     Initial Impression / Assessment and Plan / ED Course  I have reviewed the triage vital signs and the nursing notes.  Pertinent labs  results that were available during my care of the patient were reviewed by me and  considered in my medical decision making (see chart for details).        12:07 AM Patient presented for reported abdominal contractions.  [facility] Dr Debroah LoopArnold, patient is not currently having contractions.  Plan to monitor until 1230am, and if no further contractions she will be discharged.  She is instructed to follow-up with her OB/GYN.  Vitals signs are Appropriate.  She did have 1 episode of vomiting that is resolved 1:03 AM Patient improved.  She was seen walking to the bathroom without distress.  [facility] monitor and feel she is safe to be taken off fetal monitoring    Urine is nitrate positive, but no signs of bacteria.  Patient denies dysuria.  No fever.  Will send urine culture and defer antibiotics for now.  Expressed to her that she needs to see  OB/GYN today. No signs of acute obstetric emergency.  No signs of acute abdominal emergency. Final Clinical Impressions(s) / ED Diagnoses   Final diagnoses:  [redacted] weeks gestation of pregnancy  Abdominal cramping    ED Discharge Orders    None       Zadie Rhine, MD 01/10/19 [phone]

## 2019-01-10 NOTE — Progress Notes (Signed)
Updated Wilkie Aye RN of only occasional contractions on monitor and fetal heart tones not tracing.

## 2019-01-18 ENCOUNTER — Encounter: Payer: Medicaid Other | Admitting: Women's Health

## 2019-01-19 LAB — SUSCEPTIBILITY RESULT

## 2019-01-19 LAB — SUSCEPTIBILITY, AER + ANAEROB

## 2019-01-21 ENCOUNTER — Other Ambulatory Visit: Payer: Self-pay

## 2019-01-21 ENCOUNTER — Emergency Department (HOSPITAL_COMMUNITY)
Admission: EM | Admit: 2019-01-21 | Discharge: 2019-01-21 | Disposition: A | Discharge status: Home / Self Care | Payer: Medicaid Other | Attending: Emergency Medicine | Admitting: Emergency Medicine

## 2019-01-21 ENCOUNTER — Encounter (HOSPITAL_COMMUNITY): Payer: Self-pay

## 2019-01-21 DIAGNOSIS — O9989 Other specified diseases and conditions complicating pregnancy, childbirth and the puerperium: Secondary | ICD-10-CM | POA: Insufficient documentation

## 2019-01-21 DIAGNOSIS — Z79899 Other long term (current) drug therapy: Secondary | ICD-10-CM | POA: Diagnosis not present

## 2019-01-21 DIAGNOSIS — Z3A33 33 weeks gestation of pregnancy: Secondary | ICD-10-CM | POA: Diagnosis not present

## 2019-01-21 DIAGNOSIS — Z87891 Personal history of nicotine dependence: Secondary | ICD-10-CM | POA: Insufficient documentation

## 2019-01-21 DIAGNOSIS — R1011 Right upper quadrant pain: Secondary | ICD-10-CM | POA: Diagnosis not present

## 2019-01-21 LAB — URINALYSIS, ROUTINE W REFLEX MICROSCOPIC
Bilirubin Urine: NEGATIVE
Glucose, UA: NEGATIVE mg/dL
Hgb urine dipstick: NEGATIVE
Ketones, ur: NEGATIVE mg/dL
Nitrite: NEGATIVE
Protein, ur: NEGATIVE mg/dL
Specific Gravity, Urine: 1.02 (ref 1.005–1.030)
pH: 6 (ref 5.0–8.0)

## 2019-01-21 LAB — COMPREHENSIVE METABOLIC PANEL
ALT: 35 U/L (ref 0–44)
AST: 25 U/L (ref 15–41)
Albumin: 2.7 g/dL — ABNORMAL LOW (ref 3.5–5.0)
Alkaline Phosphatase: 109 U/L (ref 38–126)
Anion gap: 8 (ref 5–15)
BUN: 8 mg/dL (ref 6–20)
CO2: 20 mmol/L — ABNORMAL LOW (ref 22–32)
Calcium: 8.7 mg/dL — ABNORMAL LOW (ref 8.9–10.3)
Chloride: 110 mmol/L (ref 98–111)
Creatinine, Ser: 0.68 mg/dL (ref 0.44–1.00)
GFR calc Af Amer: >60 mL/min (ref >60)
GFR calc non Af Amer: >60 mL/min (ref >60)
Glucose, Bld: 87 mg/dL (ref 70–99)
Potassium: 3.7 mmol/L (ref 3.5–5.1)
Sodium: 138 mmol/L (ref 135–145)
Total Bilirubin: 0.1 mg/dL — ABNORMAL LOW (ref 0.3–1.2)
Total Protein: 6.8 g/dL (ref 6.5–8.1)

## 2019-01-21 LAB — URINALYSIS, MICROSCOPIC (REFLEX)

## 2019-01-21 LAB — CBC WITH DIFFERENTIAL/PLATELET
Abs Immature Granulocytes: 0.04 10*3/uL (ref 0.00–0.07)
Basophils Absolute: 0 10*3/uL (ref 0.0–0.1)
Basophils Relative: 1 %
Eosinophils Absolute: 0.1 10*3/uL (ref 0.0–0.5)
Eosinophils Relative: 2 %
HCT: 31.5 % — ABNORMAL LOW (ref 36.0–46.0)
Hemoglobin: 10.2 g/dL — ABNORMAL LOW (ref 12.0–15.0)
Immature Granulocytes: 1 %
Lymphocytes Relative: 22 %
Lymphs Abs: 1.9 10*3/uL (ref 0.7–4.0)
MCH: 29.7 pg (ref 26.0–34.0)
MCHC: 32.4 g/dL (ref 30.0–36.0)
MCV: 91.8 fL (ref 80.0–100.0)
Monocytes Absolute: 0.5 10*3/uL (ref 0.1–1.0)
Monocytes Relative: 6 %
Neutro Abs: 5.8 10*3/uL (ref 1.7–7.7)
Neutrophils Relative %: 68 %
Platelets: 193 10*3/uL (ref 150–400)
RBC: 3.43 MIL/uL — ABNORMAL LOW (ref 3.87–5.11)
RDW: 13.4 % (ref 11.5–15.5)
WBC: 8.3 10*3/uL (ref 4.0–10.5)
nRBC: 0 % (ref 0.0–0.2)

## 2019-01-21 LAB — LIPASE, BLOOD: Lipase: 32 U/L (ref 11–51)

## 2019-01-21 MED ORDER — ACETAMINOPHEN 325 MG PO TABS
650.0000 mg | ORAL_TABLET | Freq: Once | ORAL | Status: AC
  Administered 2019-01-21: 650 mg via ORAL
  Filled 2019-01-21: qty 2

## 2019-01-21 NOTE — ED Notes (Signed)
Katie from OBGYN called to inform RN fetal monitoring was looking good and Pt can be taken off the FHR Monitor. MD Notified.

## 2019-01-21 NOTE — ED Triage Notes (Signed)
Pt arrives from home via POV, [redacted]wks pregnant, c/o rt side flank pain stating she believes it is a gallstone. Pt reports N/V/D with bloody stool. Pt states pain began last night.

## 2019-01-21 NOTE — ED Notes (Signed)
Pt given two cups of water. Pt tolerated fluids without complications.

## 2019-01-21 NOTE — ED Notes (Signed)
RROB NOTIFIED OF PT 33.6WEEKS, C/O RIGHT SIDED PAIN, PAIN COMES AND GOES, REPORTS NAUSEA/VOMITING/DIARRHEA; NO VAGINAL BLEEDING OR LEAKING OF FLUID; PT REPORTS POSITIVE FETAL MOVEMENT APED RN REPORTED THAT PT HAS GALLSTONES, ALSO HAS A SEIZURE DISORDER- WHICH SHE HAS NOT TAKEN HER MEDICATION IN A WEEK BECAUSE SHE DIDN'T HAVE ANY LEFT PROVIDER WILL CONSULT WITH OB ATTENDING, DR DAVIS @[phone]

## 2019-01-21 NOTE — ED Notes (Signed)
This note also relates to the following rows which could not be included: Pulse Rate - Cannot attach notes to unvalidated device data SpO2 - Cannot attach notes to unvalidated device data  RROB SPOKE WITH PT'S RN; ED PROVIDER PLANS TO D/C PT AFTER U/A IS RESULTED; EFM WNL- EFM D/C'D; PT TO F/U WITH OB THIS WEEK RROB SPOKE WITH DR DAVIS, OB ATTENDING; AGREED WITH PLAN OF CARE

## 2019-01-21 NOTE — ED Provider Notes (Signed)
[facility] EMERGENCY DEPARTMENT Provider Note   CSN: [id number] Arrival date & time: 01/21/19  0320    History   Chief Complaint Chief Complaint  Patient presents with  . Abdominal Pain    33 weeks preg    HPI Chelsey Sullivan is a 33 y.o. female.     The history is provided by the patient.  Abdominal Pain  Pain location:  RUQ and R flank Pain quality: aching   Pain radiates to:  Does not radiate Pain severity:  Moderate Onset quality:  Gradual Duration:  1 day Timing:  Constant Progression:  Worsening Chronicity:  Recurrent Relieved by:  Nothing Worsened by:  Movement and palpation Associated symptoms: diarrhea, nausea and vomiting   Associated symptoms: no chest pain, no dysuria, no fever and no vaginal bleeding   Patient presents for 10th ER visit in 6 months. She is approximately [redacted] weeks pregnant. She reports onset of right upper quadrant/R flank pain over past day.  She reports it feels similar to prior gallstones.  This has been diagnosed at an outside hospital previously.  No fevers.  She has had vomiting, diarrhea with a small amount of blood.  No vaginal bleeding or gush of fluid.  She reports good fetal movement.  No lower abdominal pain.  She reports intermittent mild contractions that are unchanged. Past Medical History:  Diagnosis Date  . Depression   . Epilepsy [facility])     Patient Active Problem List   Diagnosis Date Noted  . Anemia, iron deficiency 11/04/2016  . Headache 11/04/2016  . Weakness 11/04/2016  . SOB (shortness of breath) 11/04/2016  . Seizure disorder during pregnancy in third trimester (HCC) 11/03/2016  . 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 Patient noncompliance 11/03/2016  . Abnormal glucose affecting pregnancy 11/03/2016    Past Surgical History:  Procedure Laterality Date  . HERNIA REPAIR     umbilical hernia repair x 2.     OB History    Gravida  6   Para  3   Term  2   Preterm       AB      Living  6     SAB      TAB      Ectopic      Multiple  1   Live Births  4            Home Medications    Prior to Admission medications   Medication Sig Start Date End Date Taking? Authorizing Provider  levETIRAcetam (KEPPRA) 500 MG tablet Take 1 tablet (500 mg total) by mouth 2 (two) times daily. 11/04/16  Yes Constant, Peggy, MD  Prenatal Vit-Fe Fumarate-FA (PRENATAL MULTIVITAMIN) TABS tablet Take 1 tablet by mouth daily at 12 noon. 11/05/16  Yes Constant, Peggy, MD  albuterol (PROVENTIL HFA;VENTOLIN HFA) 108 (90 BASE) MCG/ACT inhaler Inhale 2 puffs into the lungs every 6 (six) hours as needed for wheezing or shortness of breath.    [provider]    Family History Family History  Problem Relation Age of Onset  . Hypertension Other   . Diabetes Other   . Epilepsy Other     Social History Social History   Tobacco Use  . Smoking status: Former Smoker    Packs/day: 1.00    Types: Cigarettes    Last attempt to quit: 12/31/2018    Years since quitting: 0.0  . Smokeless tobacco: Never Used  Substance Use Topics  . Alcohol  use: Yes    Comment: occ. none since +pregnancy test.  . Drug use: No     Allergies   Amoxicillin and Penicillins   Review of Systems Review of Systems  Constitutional: Negative for fever.  Cardiovascular: Negative for chest pain.  Gastrointestinal: Positive for abdominal pain, diarrhea, nausea and vomiting.  Genitourinary: Negative for dysuria and vaginal bleeding.  All other systems reviewed and are negative.    Physical Exam Updated Vital Signs BP (!) 107/55 (BP Location: Left Arm)   Pulse 77   Temp 98.5 F (36.9 C) (Oral)   Resp 18   Ht 1.803 m (5\' 11" )   Wt 111.1 kg   LMP 12/23/2018   SpO2 100%   BMI 34.17 kg/m   Physical Exam CONSTITUTIONAL: Well developed/well nourished HEAD: Normocephalic/atraumatic, no icterus EYES: EOMI/PERRL ENMT: Mucous membranes moist NECK: supple no meningeal signs  SPINE/BACK:entire spine nontender CV: S1/S2 noted, no murmurs/rubs/gallops noted LUNGS: Lungs are clear to auscultation bilaterally, no apparent distress ABDOMEN: soft, mild RUQ tenderness, gravid, no rebound or guarding, bowel sounds noted throughout abdomen GU:no cva tenderness NEURO: Pt is awake/alert/appropriate, moves all extremitiesx4.  No facial droop.  Pt walks without difficulty EXTREMITIES: pulses normal/equal, full ROM SKIN: warm, color normal PSYCH: no abnormalities of mood noted, alert and oriented to situation   ED Treatments / Results  Labs (all labs ordered are listed, but only abnormal results are displayed) Labs Reviewed  CBC WITH DIFFERENTIAL/PLATELET - Abnormal; Notable for the following components:      Result Value   RBC 3.43 (*)    Hemoglobin 10.2 (*)    HCT 31.5 (*)    All other components within normal limits  COMPREHENSIVE METABOLIC PANEL - Abnormal; Notable for the following components:   CO2 20 (*)    Calcium 8.7 (*)    Albumin 2.7 (*)    Total Bilirubin 0.1 (*)    All other components within normal limits  LIPASE, BLOOD  URINALYSIS, ROUTINE W REFLEX MICROSCOPIC    EKG None  Radiology No results found.  Procedures Procedures Medications Ordered in ED Medications  acetaminophen (TYLENOL) tablet 650 mg (650 mg Oral Given 01/21/19 0448)     Initial Impression / Assessment and Plan / ED Course  I have reviewed the triage vital signs and the nursing notes.  Pertinent labs  results that were available during my care of the patient were reviewed by me and considered in my medical decision making (see chart for details).        Patient presents for 10th ER visit in 6 months.  Tonight's complaint includes right upper quadrant pain which she feels is related to cholelithiasis. Vitals are appropriate, she is afebrile.  She has no vomiting here   She is taking p.o. fluids. In terms of OB , no issues on fetal monitoring, she has [facility] Labs are reassuring, my suspicion for acute cholecystitis is low.  My suspicion for other acute abdominal emergency is low No signs any acute obstetric emergency.  She reports she is going to be seen by her OB/GYN in 2 days.  Advised to treat pain with Tylenol, and restrict her diet to no greasy or fatty foods   Final Clinical Impressions(s) / ED Diagnoses   Final diagnoses:  RUQ pain    ED Discharge Orders    None       Zadie RhineWickline, Bertrum Helmstetter, MD 01/21/19 [phone]

## 2019-01-23 ENCOUNTER — Ambulatory Visit (INDEPENDENT_AMBULATORY_CARE_PROVIDER_SITE_OTHER): Payer: Medicaid Other | Admitting: Women's Health

## 2019-01-23 ENCOUNTER — Encounter: Payer: Self-pay | Admitting: Women's Health

## 2019-01-23 ENCOUNTER — Ambulatory Visit: Payer: Medicaid Other | Admitting: *Deleted

## 2019-01-23 ENCOUNTER — Other Ambulatory Visit: Payer: Self-pay

## 2019-01-23 VITALS — BP 115/74 | HR 84 | Wt 252.0 lb

## 2019-01-23 DIAGNOSIS — Z3490 Encounter for supervision of normal pregnancy, unspecified, unspecified trimester: Secondary | ICD-10-CM | POA: Insufficient documentation

## 2019-01-23 DIAGNOSIS — Z1389 Encounter for screening for other disorder: Secondary | ICD-10-CM

## 2019-01-23 DIAGNOSIS — Z1371 Encounter for nonprocreative screening for genetic disease carrier status: Secondary | ICD-10-CM

## 2019-01-23 DIAGNOSIS — Z23 Encounter for immunization: Secondary | ICD-10-CM

## 2019-01-23 DIAGNOSIS — Z3A34 34 weeks gestation of pregnancy: Secondary | ICD-10-CM | POA: Diagnosis not present

## 2019-01-23 DIAGNOSIS — Z331 Pregnant state, incidental: Secondary | ICD-10-CM

## 2019-01-23 DIAGNOSIS — Z3483 Encounter for supervision of other normal pregnancy, third trimester: Secondary | ICD-10-CM

## 2019-01-23 DIAGNOSIS — O0933 Supervision of pregnancy with insufficient antenatal care, third trimester: Secondary | ICD-10-CM | POA: Diagnosis not present

## 2019-01-23 DIAGNOSIS — Z641 Problems related to multiparity: Secondary | ICD-10-CM

## 2019-01-23 DIAGNOSIS — Z1379 Encounter for other screening for genetic and chromosomal anomalies: Secondary | ICD-10-CM

## 2019-01-23 LAB — POCT URINALYSIS DIPSTICK OB
Blood, UA: NEGATIVE
Glucose, UA: NEGATIVE
Ketones, UA: NEGATIVE
Leukocytes, UA: NEGATIVE
Nitrite, UA: NEGATIVE

## 2019-01-23 MED ORDER — BLOOD PRESSURE MONITOR MISC: 0 refills | Status: DC

## 2019-01-23 MED ORDER — LEVETIRACETAM 500 MG PO TABS: 500.0000 mg | ORAL_TABLET | Freq: Two times a day (BID) | ORAL | 2 refills | Status: DC

## 2019-01-23 MED ORDER — CITRANATAL ASSURE 35-1 & 300 MG PO MISC: ORAL | 11 refills | Status: DC

## 2019-01-23 MED ORDER — PANTOPRAZOLE SODIUM 20 MG PO TBEC: 20.0000 mg | DELAYED_RELEASE_TABLET | Freq: Every day | ORAL | 6 refills | Status: DC

## 2019-01-23 NOTE — Patient Instructions (Signed)
[facility], I greatly value your feedback.  If you receive a survey following your visit with us today, we appreciate you taking the time to fill it out.  Thanks, Joellyn HaffKim Shalayah Beagley, CNM, [facility]NP-BC  [facility] is now [facility] ([address] [place], [place] [address]) Entrance located off of [address] Free 24/7 valet parking   Home Blood Pressure Monitoring for Patients   Your provider has recommended that you check your blood pressure (BP) at least once a week at home. If you do not have a blood pressure cuff at home, one will be provided for you. Contact your provider if you have not received your monitor within 1 week.   Helpful Tips for Accurate Home Blood Pressure Checks  . Don't smoke, exercise, or drink caffeine 30 minutes before checking your BP . Use the restroom before checking your BP (a full bladder can raise your pressure) . Relax in a comfortable upright chair . Feet on the ground . Left arm resting comfortably on a flat surface at the level of your heart . Legs uncrossed . Back supported . Sit quietly and don't talk . Place the cuff on your bare arm . Adjust snuggly, so that only two fingertips can fit between your skin and the top of the cuff . Check 2 readings separated by at least one minute . Keep a log of your BP readings . For a visual, please reference this diagram: http://ccnc.care/bpdiagram  Provider Name: [facility]     Phone: [phone]  Zone 1: ALL CLEAR  Continue to monitor your symptoms:  . BP reading is less than 140 (top number) or less than 90 (bottom number)  . No right upper stomach pain . No headaches or seeing spots . No feeling nauseated or throwing up . No swelling in face and hands  Zone 2: CAUTION Call your doctor's office for any of the following:  . BP reading is greater than 140 (top number) or greater than 90 (bottom number)  . Stomach pain under your ribs in the middle or  right side . Headaches or seeing spots . Feeling nauseated or throwing up . Swelling in face and hands  Zone 3: EMERGENCY  Seek immediate medical care if you have any of the following:  . BP reading is greater than160 (top number) or greater than 110 (bottom number) . Severe headaches not improving with Tylenol . Serious difficulty catching your breath . Any worsening symptoms from Zone 2     Call the office [phone]) or go to [facility] if:  You begin to have strong, frequent contractions  Your water breaks.  Sometimes it is a big gush of fluid, sometimes it is just a trickle that keeps getting your panties wet or running down your legs  You have vaginal bleeding.  It is normal to have a small amount of spotting if your cervix was checked.   You don't feel your baby moving like normal.  If you don't, get you something to eat and drink and lay down and focus on feeling your baby move.  You should feel at least 10 movements in 2 hours.  If you don't, you should call the office or go to [facility].    Preterm Labor and Birth Information  The normal length of a pregnancy is 39-41 weeks. Preterm labor is when labor starts before 37 completed weeks of pregnancy. What are the risk factors for  preterm labor? Preterm labor is more likely to occur in women who:  Have certain infections during pregnancy such as a bladder infection, sexually transmitted infection, or infection inside the uterus (chorioamnionitis).  Have a shorter-than-normal cervix.  Have gone into preterm labor before.  Have had surgery on their cervix.  Are younger than age 58 or older than age 69.  Are African American.  Are pregnant with twins or multiple babies (multiple gestation).  Take street drugs or smoke while pregnant.  Do not gain enough weight while pregnant.  Became pregnant shortly after having been pregnant. What are the symptoms of preterm labor? Symptoms of preterm labor include:   Cramps similar to those that can happen during a menstrual period. The cramps may happen with diarrhea.  Pain in the abdomen or lower back.  Regular uterine contractions that may feel like tightening of the abdomen.  A feeling of increased pressure in the pelvis.  Increased watery or bloody mucus discharge from the vagina.  Water breaking (ruptured amniotic sac). Why is it important to recognize signs of preterm labor? It is important to recognize signs of preterm labor because babies who are born prematurely may not be fully developed. This can put them at an increased risk for:  Long-term (chronic) heart and lung problems.  Difficulty immediately after birth with regulating body systems, including blood sugar, body temperature, heart rate, and breathing rate.  Bleeding in the brain.  Cerebral palsy.  Learning difficulties.  Death. These risks are highest for babies who are born before 34 weeks of pregnancy. How is preterm labor treated? Treatment depends on the length of your pregnancy, your condition, and the health of your baby. It may involve:  Having a stitch (suture) placed in your cervix to prevent your cervix from opening too early (cerclage).  Taking or being given medicines, such as: ? Hormone medicines. These may be given early in pregnancy to help support the pregnancy. ? Medicine to stop contractions. ? Medicines to help mature the baby's lungs. These may be prescribed if the risk of delivery is high. ? Medicines to prevent your baby from developing cerebral palsy. If the labor happens before 34 weeks of pregnancy, you may need to stay in the hospital. What should I do if I think I am in preterm labor? If you think that you are going into preterm labor, call your health care provider right away. How can I prevent preterm labor in future pregnancies? To increase your chance of having a full-term pregnancy:  Do not use any tobacco products, such as cigarettes,  chewing tobacco, and e-cigarettes. If you need help quitting, ask your health care provider.  Do not use street drugs or medicines that have not been prescribed to you during your pregnancy.  Talk with your health care provider before taking any herbal supplements, even if you have been taking them regularly.  Make sure you gain a healthy amount of weight during your pregnancy.  Watch for infection. If you think that you might have an infection, get it checked right away.  Make sure to tell your health care provider if you have gone into preterm labor before. This information is not intended to replace advice given to you by your health care provider. Make sure you discuss any questions you have with your health care provider. Document Released: 10/31/2003 Document Revised: 01/21/2016 Document Reviewed: 01/01/2016 Elsevier Interactive Patient Education  2019 Elsevier Inc.   Gallbladder Eating Plan If you have a gallbladder condition, you  may have trouble digesting fats. Eating a low-fat diet can help reduce your symptoms, and may be helpful before and after having surgery to remove your gallbladder (cholecystectomy). Your health care provider may recommend that you work with a diet and nutrition specialist (dietitian) to help you reduce the amount of fat in your diet. What are tips for following this plan? General guidelines  Limit your fat intake to less than 30% of your total daily calories. If you eat around 1,800 calories each day, this is less than 60 grams (g) of fat per day.  Fat is an important part of a healthy diet. Eating a low-fat diet can make it hard to maintain a healthy body weight. Ask your dietitian how much fat, calories, and other nutrients you need each day.  Eat small, frequent meals throughout the day instead of three large meals.  Drink at least 8-10 cups of fluid a day. Drink enough fluid to keep your urine clear or pale yellow.  Limit alcohol intake to no more  than 1 drink a day for nonpregnant women and 2 drinks a day for men. One drink equals 12 oz of beer, 5 oz of wine, or 1 oz of hard liquor. Reading food labels  Check Nutrition Facts on food labels for the amount of fat per serving. Choose foods with less than 3 grams of fat per serving. Shopping  Choose nonfat and low-fat healthy foods. Look for the words "nonfat," "low fat," or "fat free."  Avoid buying processed or prepackaged foods. Cooking  Cook using low-fat methods, such as baking, broiling, grilling, or boiling.  Cook with small amounts of healthy fats, such as olive oil, grapeseed oil, canola oil, or sunflower oil. What foods are recommended?   All fresh, frozen, or canned fruits and vegetables.  Whole grains.  Low-fat or non-fat (skim) milk and yogurt.  Lean meat, skinless poultry, fish, eggs, and beans.  Low-fat protein supplement powders or drinks.  Spices and herbs. What foods are not recommended?  High-fat foods. These include baked goods, fast food, fatty cuts of meat, ice cream, french toast, sweet rolls, pizza, cheese bread, foods covered with butter, creamy sauces, or cheese.  Fried foods. These include french fries, tempura, battered fish, breaded chicken, fried breads, and sweets.  Foods with strong odors.  Foods that cause bloating and gas. Summary  A low-fat diet can be helpful if you have a gallbladder condition, or before and after gallbladder surgery.  Limit your fat intake to less than 30% of your total daily calories. This is about 60 g of fat if you eat 1,800 calories each day.  Eat small, frequent meals throughout the day instead of three large meals. This information is not intended to replace advice given to you by your health care provider. Make sure you discuss any questions you have with your health care provider. Document Released: 08/15/2013 Document Revised: 09/17/2016 Document Reviewed: 09/17/2016 Elsevier Interactive Patient  Education  2019 [employer].

## 2019-01-23 NOTE — Progress Notes (Addendum)
INITIAL OBSTETRICAL VISIT Patient name: Chelsey Sullivan MRN [id number]  Date of birth: 1985/11/11 Chief Complaint:   Initial Prenatal Visit (contractions)  History of Present Illness:   Chelsey Sullivan is a 33 y.o. G70P6006 African American female at [redacted]w[redacted]d by 23wk u/s, with an Estimated Date of Delivery: 03/05/19 being seen today for her initial obstetrical visit.   Her obstetrical history is significant for term uncomplicated svb x 6, reports last few induced @ 39wks d/t seizure disorder.  Last seizure earlier in this pregnancy, needs refill on Keppra, has been out for about a week.  Today she reports reflux, has been on zantac.Wants rx. Wants BTL. Needs rx for pnv. Recently went to [facility] for RUQ pain, states she knows she has stones in gallbladder, was supposed to have cholecystectomy after last baby, but didn't get it done. States pain better today.  Patient's last menstrual period was 12/23/2018. Last pap 2019 @ [facility]. Results were: normal Review of Systems:   Pertinent items are noted in HPI Denies cramping/contractions, leakage of fluid, vaginal bleeding, abnormal vaginal discharge w/ itching/odor/irritation, headaches, visual changes, shortness of breath, chest pain, abdominal pain, severe nausea/vomiting, or problems with urination or bowel movements unless otherwise stated above.  Pertinent History Reviewed:  Reviewed past medical,surgical, social, obstetrical and family history.  Reviewed problem list, medications and allergies. OB History  Gravida Para Term Preterm AB Living  7 6 6     6   SAB TAB Ectopic Multiple Live Births        0 6    # Outcome Date GA Lbr Len/2nd Weight Sex Delivery Anes PTL Lv  7 Current           6 Term 03/04/18 [redacted]w[redacted]d  6 lb (2.722 kg) M Vag-Spont   LIV  5 Term 12/11/16 [redacted]w[redacted]d  6 lb (2.722 kg) F Vag-Spont EPI N LIV  4 Term 04/18/15 [redacted]w[redacted]d  6 lb 9 oz (2.977 kg) F Vag-Spont EPI N LIV  3 Term 03/06/13 [redacted]w[redacted]d  8 lb 2 oz (3.685 kg) M Vag-Spont EPI N LIV     Birth  Comments: System Generated. Please review and update pregnancy details.  2 Term 07/23/10 [redacted]w[redacted]d  8 lb 14 oz (4.026 kg) M Vag-Spont EPI N LIV  1 Term 09/12/08 [redacted]w[redacted]d  8 lb 10 oz (3.912 kg) M Vag-Spont EPI  LIV   Physical Assessment:   Vitals:   01/23/19 1402  BP: 115/74  Pulse: 84  Weight: 252 lb (114.3 kg)  Body mass index is 35.15 kg/m.       Physical Examination:  General appearance - well appearing, and in no distress  Mental status - alert, oriented to person, place, and time  Psych:  She has a normal mood and affect  Skin - warm and dry, normal color, no suspicious lesions noted  Chest - effort normal, all lung fields clear to auscultation bilaterally  Heart - normal rate and regular rhythm  Abdomen - soft, nontender  Extremities:  No swelling or varicosities noted  Thin prep pap is not done   Fetal Heart Rate (bpm): 150 via doppler FH: 34cm  Results for orders placed or performed in visit on 01/23/19 (from the past 24 hour(s))  POC Urinalysis Dipstick OB   Collection Time: 01/23/19  2:18 PM  Result Value Ref Range   Color, UA     Clarity, UA     Glucose, UA Negative Negative   Bilirubin, UA     Ketones, UA neg  Spec Grav, UA     Blood, UA neg    pH, UA     POC,PROTEIN,UA Trace Negative, Trace, Small (1+), Moderate (2+), Large (3+), 4+   Urobilinogen, UA     Nitrite, UA neg    Leukocytes, UA Negative Negative   Appearance     Odor      Assessment & Plan:  1) Low-Risk Pregnancy [id number] at 9124w1d with an Estimated Date of Delivery: 03/05/19   2) Initial OB visit  3) Seizure disorder> refilled Keppra 500mg  BID, discussed seizure disorder not a medical indication for IOL   4) Known cholelithiasis> pain better today, to follow low-fat diet for now, let us know if worsens, will f/u postpartum  5) Grand multiparity  6) Late prenatal care  7) Reflux> rx protonix  8) Wants BTL> reviewed risks/benefits, consent today  Meds:  Meds ordered this encounter   Medications  . Blood Pressure Monitor MISC    Sig: For regular home bp monitoring during pregnancy    Dispense:  1 each    Refill:  0    Z34.90  . levETIRAcetam (KEPPRA) 500 MG tablet    Sig: Take 1 tablet (500 mg total) by mouth 2 (two) times daily.    Dispense:  60 tablet    Refill:  2    Order Specific Question:   Supervising Provider    Answer:   Despina HiddenEURE, LUTHER H [[id number]]  . pantoprazole (PROTONIX) 20 MG tablet    Sig: Take 1 tablet (20 mg total) by mouth daily.    Dispense:  30 tablet    Refill:  6    Order Specific Question:   Supervising Provider    Answer:   Despina HiddenEURE, LUTHER H [[id number]]  . Prenat w/o A-FeCbGl-DSS-FA-DHA (CITRANATAL ASSURE) 35-1 & 300 MG tablet    Sig: One tablet and one capsule daily    Dispense:  60 tablet    Refill:  11    Order Specific Question:   Supervising Provider    Answer:   Duane LopeEURE, LUTHER H [[id number]]    Initial labs obtained Continue prenatal vitamins Reviewed n/v relief measures and warning s/s to report Reviewed recommended weight gain based on pre-gravid BMI Encouraged well-balanced diet Genetic Screening discussed: requested maternit21 Cystic fibrosis, SMA, Fragile X screening discussed requested Ultrasound discussed; fetal survey: results reviewed [facility] completed>PCM not here, form faxed Does not have home bp cuff. [facility]. Check bp weekly, let us know if >140/90.   Follow-up: Return for asap sugar test only (no visit), then 2wks for LROB in person; , Sign BTL consent today.   Orders Placed This Encounter  Procedures  . GC/Chlamydia Probe Amp  . Urine Culture  . Tdap vaccine greater than or equal to 7yo IM  . Obstetric Panel, Including HIV  . Urinalysis, Routine w reflex microscopic  . Pain Management Screening Profile (10S)  . SMN1 COPY NUMBER ANALYSIS (SMA Carrier Screen)  . Fragile X, PCR and Southern  . MaterniT 21 plus Core, Blood  . POC Urinalysis Dipstick OB    Cheral MarkerKimberly R Kristyl Athens CNM, [facility]NP-BC 01/23/2019 3:21  PM

## 2019-01-24 ENCOUNTER — Other Ambulatory Visit: Payer: Self-pay | Admitting: Women's Health

## 2019-01-24 MED ORDER — FERROUS SULFATE 325 (65 FE) MG PO TABS: 325.0000 mg | ORAL_TABLET | Freq: Two times a day (BID) | ORAL | 3 refills | Status: DC

## 2019-01-24 NOTE — Progress Notes (Signed)
Attempted to call patient, heard message that subscriber is not available at this time.

## 2019-01-25 ENCOUNTER — Other Ambulatory Visit: Payer: Self-pay

## 2019-01-25 ENCOUNTER — Other Ambulatory Visit: Payer: Medicaid Other

## 2019-01-25 DIAGNOSIS — Z3A34 34 weeks gestation of pregnancy: Secondary | ICD-10-CM

## 2019-01-25 DIAGNOSIS — O0933 Supervision of pregnancy with insufficient antenatal care, third trimester: Secondary | ICD-10-CM

## 2019-01-25 LAB — OBSTETRIC PANEL, INCLUDING HIV
Antibody Screen: NEGATIVE
Basophils Absolute: 0 10*3/uL (ref 0.0–0.2)
Basos: 1 %
EOS (ABSOLUTE): 0.1 10*3/uL (ref 0.0–0.4)
Eos: 1 %
HIV Screen 4th Generation wRfx: NONREACTIVE
Hematocrit: 30.4 % — ABNORMAL LOW (ref 34.0–46.6)
Hemoglobin: 10 g/dL — ABNORMAL LOW (ref 11.1–15.9)
Hepatitis B Surface Ag: NEGATIVE
Immature Grans (Abs): 0 10*3/uL (ref 0.0–0.1)
Immature Granulocytes: 0 %
Lymphocytes Absolute: 1.4 10*3/uL (ref 0.7–3.1)
Lymphs: 18 %
MCH: 28.8 pg (ref 26.6–33.0)
MCHC: 32.9 g/dL (ref 31.5–35.7)
MCV: 88 fL (ref 79–97)
Monocytes Absolute: 0.6 10*3/uL (ref 0.1–0.9)
Monocytes: 7 %
Neutrophils Absolute: 5.6 10*3/uL (ref 1.4–7.0)
Neutrophils: 73 %
Platelets: 216 10*3/uL (ref 150–450)
RBC: 3.47 x10E6/uL — ABNORMAL LOW (ref 3.77–5.28)
RDW: 13.6 % (ref 11.7–15.4)
RPR Ser Ql: NONREACTIVE
Rh Factor: POSITIVE
Rubella Antibodies, IGG: 3.29 index (ref >0.99)
WBC: 7.7 10*3/uL (ref 3.4–10.8)

## 2019-01-25 LAB — URINALYSIS, ROUTINE W REFLEX MICROSCOPIC
Bilirubin, UA: NEGATIVE
Glucose, UA: NEGATIVE
Leukocytes,UA: NEGATIVE
Nitrite, UA: NEGATIVE
RBC, UA: NEGATIVE
Specific Gravity, UA: >=1.03 — AB (ref 1.005–1.030)
Urobilinogen, Ur: 1 mg/dL (ref 0.2–1.0)
pH, UA: 6 (ref 5.0–7.5)

## 2019-01-26 LAB — URINE CULTURE

## 2019-01-26 LAB — MATERNIT 21 PLUS CORE, BLOOD

## 2019-01-26 LAB — CBC
Hematocrit: 29.9 % — ABNORMAL LOW (ref 34.0–46.6)
Hemoglobin: 10.2 g/dL — ABNORMAL LOW (ref 11.1–15.9)
MCH: 29.7 pg (ref 26.6–33.0)
MCHC: 34.1 g/dL (ref 31.5–35.7)
MCV: 87 fL (ref 79–97)
Platelets: 190 10*3/uL (ref 150–450)
RBC: 3.43 x10E6/uL — ABNORMAL LOW (ref 3.77–5.28)
RDW: 13 % (ref 11.7–15.4)
WBC: 7.1 10*3/uL (ref 3.4–10.8)

## 2019-01-26 LAB — GLUCOSE TOLERANCE, 2 HOURS W/ 1HR
Glucose, 1 hour: 144 mg/dL (ref 65–179)
Glucose, 2 hour: 99 mg/dL (ref 65–152)
Glucose, Fasting: 71 mg/dL (ref 65–91)

## 2019-01-26 LAB — HIV ANTIBODY (ROUTINE TESTING W REFLEX): HIV Screen 4th Generation wRfx: NONREACTIVE

## 2019-01-26 LAB — ANTIBODY SCREEN: Antibody Screen: NEGATIVE

## 2019-01-26 LAB — RPR: RPR Ser Ql: NONREACTIVE

## 2019-01-27 LAB — URINE CULTURE: Culture: >=100000 — AB

## 2019-01-28 NOTE — Progress Notes (Signed)
ED Antimicrobial Stewardship Positive Culture Follow Up   Chelsey Sullivan is an 33 y.o. female who presented to [facility] on 01/21/2019 with a chief complaint of  Chief Complaint  Patient presents with  . Abdominal Pain    33 weeks preg    Recent Results (from the past 720 hour(s))  Urine culture     Status: Abnormal   Collection Time: 01/10/19  1:55 AM  Result Value Ref Range Status   Specimen Description   Final    URINE, CLEAN CATCH Performed at [facility], [address], [place], [place] [address]    Special Requests   Final    NONE Performed at [facility], [address] [place], [place] [address]    Culture (A)  Final    >=100,000 COLONIES/mL STAPHYLOCOCCUS SPECIES (COAGULASE NEGATIVE) SEE SEPARATE REPORT FOR SUSCEPTIBILITIES IN [facility] FOR DOS 01/10/19 Performed at [employer] Performed at [facility], [address] [place], [place] [address]    Report Status 01/27/2019 FINAL  Final  Susceptibility, Aer + Anaerob     Status: Abnormal   Collection Time: 01/10/19  1:55 AM  Result Value Ref Range Status   Suscept, Aer + Anaerob Final report (A)  Corrected    Comment: (NOTE) Performed At: [facility] [address] [place], [place] [id number] Rush Farmer MD DE:[id number] CORRECTED ON 05/28 AT 2335: PREVIOUSLY REPORTED AS Preliminary report    Source of Sample URINE, RANDOM  Final    Comment: Performed at [facility], [place] [address], [place] [employer], [place] [address]  Susceptibility Result     Status: Abnormal   Collection Time: 01/10/19  1:55 AM  Result Value Ref Range Status   Suscept Result 1 Comment (A)  Final    Comment: (NOTE) Staphylococcus hominis Identification performed by account, not confirmed by this laboratory. Based on susceptibility to oxacillin this isolate would be susceptible to: *Penicillinase-stable penicillins, such as:  Cloxacillin, Dicloxacillin, Nafcillin *Beta-lactam combination agents, such as:  Amoxicillin-clavulanic acid, Ampicillin-sulbactam,  Piperacillin-tazobactam *Oral cephems, such as:  Cefaclor, Cefdinir, Cefpodoxime, Cefprozil, Cefuroxime,  Cephalexin, Loracarbef *Parenteral cephems, such as:  Cefazolin, Cefepime, Cefotaxime, Cefotetan, Ceftaroline,  Ceftizoxime, Ceftriaxone, Cefuroxime *Carbapenems, such as:  Doripenem, Ertapenem, Imipenem, Meropenem    Antimicrobial Suscept Comment  Corrected    Comment: (NOTE)      ** S = Susceptible; I = Intermediate; R = Resistant **                   P = Positive; N = Negative            MICS are expressed in micrograms per mL   Antibiotic                 RSLT#1    RSLT#2    RSLT#3    RSLT#4 Ciprofloxacin                  S Gentamicin                     S Levofloxacin                   S Linezolid                      S Nitrofurantoin                 S Oxacillin  S Penicillin                     R Rifampin                       S Tetracycline                   R Trimethoprim/Sulfa             S Vancomycin                     S Performed At: [facility] [address] [place], [place] [id number] Jolene SchimkeNagendra Sanjai MD WU:[phone]   Urine Culture     Status: None   Collection Time: 01/23/19  5:00 PM  Result Value Ref Range Status   Urine Culture, Routine Final report  Final   Organism ID, Bacteria Comment  Final    Comment: Mixed urogenital flora 50,000-100,000 colony forming units per mL    [x]  Patient discharged originally without antimicrobial agent and treatment is now indicated   New antibiotic prescription: Flow Manager to call patient. If patient has OBGYN and been prescribed antibiotics for UTI, no further treatment indicated. If no antibiotics have been prescribed, start Keflex 500mg  PO q12h x 5 days.  ED Provider: Claude MangesJohana Soto, PA-C   Almon HerculesBaird, Daziya Redmond P 01/28/2019, 10:52 AM Clinical Pharmacist Monday - Friday phone -  [phone] Saturday - Sunday phone - [phone]

## 2019-01-29 ENCOUNTER — Telehealth: Payer: Self-pay | Admitting: Emergency Medicine

## 2019-01-29 LAB — MATERNIT 21 PLUS CORE, BLOOD
Fetal Fraction: 32
Result (T21): NEGATIVE
Trisomy 13 (Patau syndrome): NEGATIVE
Trisomy 18 (Edwards syndrome): NEGATIVE
Trisomy 21 (Down syndrome): NEGATIVE

## 2019-01-29 NOTE — Telephone Encounter (Signed)
Post ED Visit - Positive Culture Follow-up: Unsuccessful Patient Follow-up  Culture assessed and recommendations reviewed by:  []  Elenor Quinones, Pharm.D. []  Heide Guile, Pharm.D., BCPS AQ-ID []  Parks Neptune, Pharm.D., BCPS []  Alycia Rossetti, Pharm.D., BCPS []  [place], [place].D., BCPS, AAHIVP []  Legrand Como, Pharm.D., BCPS, AAHIVP []  Wynell Balloon, PharmD [x]  Elicia Lamp, PharmD, BCPS  Positive urine culture  [x]  Patient discharged without antimicrobial prescription and treatment is now indicated []  Organism is resistant to prescribed ED discharge antimicrobial []  Patient with positive blood cultures   Unable to contact patient after 3 attempts, letter will be sent to address on file  Georgina Peer 01/29/2019, 5:14 PM

## 2019-01-30 LAB — SMN1 COPY NUMBER ANALYSIS (SMA CARRIER SCREENING)

## 2019-01-31 ENCOUNTER — Telehealth: Payer: Self-pay | Admitting: *Deleted

## 2019-01-31 NOTE — Telephone Encounter (Signed)
-----   Message from Roma Schanz, [place] sent at 01/31/2019 10:16 AM EDT ----- Will you let her know genetic screen normal, hemoglobin low, I sent rx for Fe BID, take w/ OJ, take pnv daily. Thanks

## 2019-01-31 NOTE — Telephone Encounter (Signed)
Left message @ 10:53 am. JSY 

## 2019-02-01 NOTE — Telephone Encounter (Signed)
Left message @ 2:07 pm. JSY °

## 2019-02-06 ENCOUNTER — Ambulatory Visit (INDEPENDENT_AMBULATORY_CARE_PROVIDER_SITE_OTHER): Payer: Medicaid Other | Admitting: Obstetrics and Gynecology

## 2019-02-06 ENCOUNTER — Other Ambulatory Visit: Payer: Self-pay

## 2019-02-06 VITALS — BP 89/59 | HR 95 | Wt 262.8 lb

## 2019-02-06 DIAGNOSIS — Z3483 Encounter for supervision of other normal pregnancy, third trimester: Secondary | ICD-10-CM

## 2019-02-06 DIAGNOSIS — Z3A36 36 weeks gestation of pregnancy: Secondary | ICD-10-CM

## 2019-02-06 DIAGNOSIS — D508 Other iron deficiency anemias: Secondary | ICD-10-CM

## 2019-02-06 DIAGNOSIS — Z1389 Encounter for screening for other disorder: Secondary | ICD-10-CM

## 2019-02-06 DIAGNOSIS — Z331 Pregnant state, incidental: Secondary | ICD-10-CM

## 2019-02-06 LAB — OB RESULTS CONSOLE GBS: GBS: NEGATIVE

## 2019-02-06 LAB — OB RESULTS CONSOLE GC/CHLAMYDIA: Gonorrhea: NEGATIVE

## 2019-02-06 NOTE — Telephone Encounter (Signed)
Amber, LPN discussed results with pt at 6/15 appt. [place]

## 2019-02-06 NOTE — Assessment & Plan Note (Signed)
To begin iron twice daily

## 2019-02-06 NOTE — Progress Notes (Signed)
Patient ID: Chelsey Sullivan, female   DOB: Dec 04, 1985, 33 y.o.   MRN: [id number]    LOW-RISK PREGNANCY VISIT Patient name: Chelsey Sullivan MRN [id number]  Date of birth: 06-Nov-1985 Chief Complaint:   Routine Prenatal Visit  History of Present Illness:   Chelsey Sullivan is a 33 y.o. G32P6006 female at [redacted]w[redacted]d with an Estimated Date of Delivery: 03/05/19 being seen today for ongoing management of a low-risk pregnancy. Hx of seizures, is using her keppra. Has been having some contraction Today she reports no complaints. Contractions: Irregular. Vag. Bleeding: None.  Movement: Present. denies leaking of fluid. Review of Systems:   Pertinent items are noted in HPI Denies abnormal vaginal discharge w/ itching/odor/irritation, headaches, visual changes, shortness of breath, chest pain, abdominal pain, severe nausea/vomiting, or problems with urination or bowel movements unless otherwise stated above. Pertinent History Reviewed:  Reviewed past medical,surgical, social, obstetrical and family history.  Reviewed problem list, medications and allergies. Physical Assessment:   Vitals:   02/06/19 1415  BP: (!) 89/59  Pulse: 95  Weight: 262 lb 12.8 oz (119.2 kg)  Body mass index is 36.65 kg/m.        Physical Examination:   General appearance: Well appearing, and in no distress  Mental status: Alert, oriented to person, place, and time  Skin: Warm & dry  Cardiovascular: Normal heart rate noted  Respiratory: Normal respiratory effort, no distress  Abdomen: Soft, gravid, nontender  Pelvic: Cervical exam performed vertex, head in LLQ        Extremities: Edema: Trace  Fetal Status: Fetal Heart Rate (bpm): 119 Fundal Height: 36 cm Movement: Present    No results found for this or any previous visit (from the past 24 hour(s)).  Assessment & Plan:  1) Low-risk pregnancy G7P6006 at [redacted]w[redacted]d with an Estimated Date of Delivery: 03/05/19 oblique fetal presentation, vertex in left lower quadrant  2) chronic edema in  legs, support stockings to help   Meds: No orders of the defined types were placed in this encounter.  Labs/procedures today: GBC/GCCHL  Plan:  Continue routine obstetrical care, support stockings to knee length F/u in 1 week    Follow-up: Return in about 1 week (around 02/13/2019) for IN PERSON.  Orders Placed This Encounter  Procedures  . GC/Chlamydia Probe Amp  . Culture, beta strep (group b only)  . Pain Management Screening Profile (10S)  . POC Urinalysis Dipstick OB   By signing my name below, I, Samul Dada, attest that this documentation has been prepared under the direction and in the presence of Jonnie Kind, MD. Electronically Signed: [place]. 02/06/19. 2:47 PM.  I personally performed the services described in this documentation, which was SCRIBED in my presence. The recorded information has been reviewed and considered accurate. It has been edited as necessary during review. Jonnie Kind, MD

## 2019-02-06 NOTE — Telephone Encounter (Signed)
Left message @ 9:34 am. Pt has appt this afternoon. Please discuss results with pt. [place]

## 2019-02-07 ENCOUNTER — Telehealth: Payer: Self-pay | Admitting: Obstetrics and Gynecology

## 2019-02-07 NOTE — Telephone Encounter (Signed)
We did not discuss her history of Cholelithiasis and her recent ED Visits for Ruq pain yesterday. Message left for pt to notify me at the office of her current condition. It may be advisable to wait til after delivery if possible .

## 2019-02-09 ENCOUNTER — Telehealth: Payer: Self-pay | Admitting: Women's Health

## 2019-02-09 LAB — SMN1 COPY NUMBER ANALYSIS (SMA CARRIER SCREENING)

## 2019-02-09 LAB — FRAGILE X, PCR AND SOUTHERN

## 2019-02-09 NOTE — Telephone Encounter (Signed)

## 2019-02-10 LAB — GC/CHLAMYDIA PROBE AMP
Chlamydia trachomatis, NAA: NEGATIVE
Neisseria Gonorrhoeae by PCR: NEGATIVE

## 2019-02-10 LAB — CULTURE, BETA STREP (GROUP B ONLY): Strep Gp B Culture: NEGATIVE

## 2019-02-13 ENCOUNTER — Ambulatory Visit (INDEPENDENT_AMBULATORY_CARE_PROVIDER_SITE_OTHER): Payer: Medicaid Other | Admitting: Women's Health

## 2019-02-13 ENCOUNTER — Encounter: Payer: Self-pay | Admitting: Women's Health

## 2019-02-13 ENCOUNTER — Other Ambulatory Visit: Payer: Self-pay

## 2019-02-13 VITALS — BP 116/72 | HR 79 | Wt 256.4 lb

## 2019-02-13 DIAGNOSIS — Z3A37 37 weeks gestation of pregnancy: Secondary | ICD-10-CM

## 2019-02-13 DIAGNOSIS — Z1389 Encounter for screening for other disorder: Secondary | ICD-10-CM

## 2019-02-13 DIAGNOSIS — Z3483 Encounter for supervision of other normal pregnancy, third trimester: Secondary | ICD-10-CM

## 2019-02-13 DIAGNOSIS — Z331 Pregnant state, incidental: Secondary | ICD-10-CM

## 2019-02-13 LAB — POCT URINALYSIS DIPSTICK OB
Blood, UA: NEGATIVE
Glucose, UA: NEGATIVE
Leukocytes, UA: NEGATIVE
Nitrite, UA: NEGATIVE

## 2019-02-13 NOTE — Patient Instructions (Signed)
[place] Centerhanika Roker, I greatly value your feedback.  If you receive a survey following your visit with us today, we appreciate you taking the time to fill it out.  Thanks, Joellyn HaffKim Meghen Akopyan, CNM, [facility]-BC  [facility] is now [facility] ([address] [place], [place] [address]) Entrance located off of [address] Free 24/7 valet parking    Call the office [phone]) or go to [facility] if:  You begin to have strong, frequent contractions  Your water breaks.  Sometimes it is a big gush of fluid, sometimes it is just a trickle that keeps getting your panties wet or running down your legs  You have vaginal bleeding.  It is normal to have a small amount of spotting if your cervix was checked.   You don't feel your baby moving like normal.  If you don't, get you something to eat and drink and lay down and focus on feeling your baby move.  You should feel at least 10 movements in 2 hours.  If you don't, you should call the office or go to [facility].    [facility] Contractions of the uterus can occur throughout pregnancy, but they are not always a sign that you are in labor. You may have practice contractions called Braxton Hicks contractions. These false labor contractions are sometimes confused with true labor. What are Deberah PeltonBraxton Hicks contractions? Braxton Hicks contractions are tightening movements that occur in the muscles of the uterus before labor. Unlike true labor contractions, these contractions do not result in opening (dilation) and thinning of the cervix. Toward the end of pregnancy (32-34 weeks), Braxton Hicks contractions can happen more often and may become stronger. These contractions are sometimes difficult to tell apart from true labor because they can be very uncomfortable. You should not feel embarrassed if you go to the hospital with false labor. Sometimes, the only way to tell if you are in true labor is for your  health care provider to look for changes in the cervix. The health care provider will do a physical exam and may monitor your contractions. If you are not in true labor, the exam should show that your cervix is not dilating and your water has not broken. If there are no other health problems associated with your pregnancy, it is completely safe for you to be sent home with false labor. You may continue to have Braxton Hicks contractions until you go into true labor. How to tell the difference between true labor and false labor True labor  Contractions last 30-70 seconds.  Contractions become very regular.  Discomfort is usually felt in the top of the uterus, and it spreads to the lower abdomen and low back.  Contractions do not go away with walking.  Contractions usually become more intense and increase in frequency.  The cervix dilates and gets thinner. False labor  Contractions are usually shorter and not as strong as true labor contractions.  Contractions are usually irregular.  Contractions are often felt in the front of the lower abdomen and in the groin.  Contractions may go away when you walk around or change positions while lying down.  Contractions get weaker and are shorter-lasting as time goes on.  The cervix usually does not dilate or become thin. Follow these instructions at home:   Take over-the-counter and prescription medicines only as told by your health care provider.  Keep up with your usual exercises and follow other  instructions from your health care provider.  Eat and drink lightly if you think you are going into labor.  If Braxton Hicks contractions are making you uncomfortable: ? Change your position from lying down or resting to walking, or change from walking to resting. ? Sit and rest in a tub of warm water. ? Drink enough fluid to keep your urine pale yellow. Dehydration may cause these contractions. ? Do slow and deep breathing several times an  hour.  Keep all follow-up prenatal visits as told by your health care provider. This is important. Contact a health care provider if:  You have a fever.  You have continuous pain in your abdomen. Get help right away if:  Your contractions become stronger, more regular, and closer together.  You have fluid leaking or gushing from your vagina.  You pass blood-tinged mucus (bloody show).  You have bleeding from your vagina.  You have low back pain that you never had before.  You feel your baby's head pushing down and causing pelvic pressure.  Your baby is not moving inside you as much as it used to. Summary  Contractions that occur before labor are called Braxton Hicks contractions, false labor, or practice contractions.  Braxton Hicks contractions are usually shorter, weaker, farther apart, and less regular than true labor contractions. True labor contractions usually become progressively stronger and regular, and they become more frequent.  Manage discomfort from [facility] contractions by changing position, resting in a warm bath, drinking plenty of water, or practicing deep breathing. This information is not intended to replace advice given to you by your health care provider. Make sure you discuss any questions you have with your health care provider. Document Released: 12/24/2016 Document Revised: 05/25/2017 Document Reviewed: 12/24/2016 Elsevier Interactive Patient Education  2019 [employer].

## 2019-02-13 NOTE — Progress Notes (Signed)
   LOW-RISK PREGNANCY VISIT Patient name: Chelsey Sullivan MRN [id number]  Date of birth: 22-Oct-1985 Chief Complaint:   Routine Prenatal Visit (feet,legs swelling make hard for her walk/pressure)  History of Present Illness:   Chelsey Sullivan is a 33 y.o. G65P6006 female at [redacted]w[redacted]d with an Estimated Date of Delivery: 03/05/19 being seen today for ongoing management of a low-risk pregnancy.  Today she reports swelling legs, makes it hard to walk. Contractions: Irregular.  .  Movement: Present. denies leaking of fluid. Review of Systems:   Pertinent items are noted in HPI Denies abnormal vaginal discharge w/ itching/odor/irritation, headaches, visual changes, shortness of breath, chest pain, abdominal pain, severe nausea/vomiting, or problems with urination or bowel movements unless otherwise stated above. Pertinent History Reviewed:  Reviewed past medical,surgical, social, obstetrical and family history.  Reviewed problem list, medications and allergies. Physical Assessment:   Vitals:   02/13/19 1541  BP: 116/72  Pulse: 79  Weight: 256 lb 6.4 oz (116.3 kg)  Body mass index is 35.76 kg/m.        Physical Examination:   General appearance: Well appearing, and in no distress  Mental status: Alert, oriented to person, place, and time  Skin: Warm & dry  Cardiovascular: Normal heart rate noted  Respiratory: Normal respiratory effort, no distress  Abdomen: Soft, gravid, nontender  Pelvic: Cervical exam deferred         Extremities: Edema: Deep pitting, indentation remains for a short time  Fetal Status: Fetal Heart Rate (bpm): 135 Fundal Height: 37 cm Movement: Present Presentation: Vertex  Results for orders placed or performed in visit on 02/13/19 (from the past 24 hour(s))  POC Urinalysis Dipstick OB   Collection Time: 02/13/19  3:41 PM  Result Value Ref Range   Color, UA     Clarity, UA     Glucose, UA Negative Negative   Bilirubin, UA     Ketones, UA trace    Spec Grav, UA     Blood,  UA neg    pH, UA     POC,PROTEIN,UA Trace Negative, Trace, Small (1+), Moderate (2+), Large (3+), 4+   Urobilinogen, UA     Nitrite, UA neg    Leukocytes, UA Negative Negative   Appearance     Odor      Assessment & Plan:  1) Low-risk pregnancy G7P6006 at [redacted]w[redacted]d with an Estimated Date of Delivery: 03/05/19   2) Rozanna Boer multip, w/ h/o rapid labors  3) Seizure disorder> on Keppra  4) Legs swelling> elevate as much as possible, compression stockings   Meds: No orders of the defined types were placed in this encounter.  Labs/procedures today: none  Plan:  Continue routine obstetrical care   Reviewed: Term labor symptoms and general obstetric precautions including but not limited to vaginal bleeding, contractions, leaking of fluid and fetal movement were reviewed in detail with the patient.  All questions were answered  Follow-up: Return in about 1 week (around 02/20/2019) for LROB.  Orders Placed This Encounter  Procedures  . POC Urinalysis Dipstick OB   Roma Schanz CNM, [facility] 02/13/2019 4:25 PM

## 2019-02-23 ENCOUNTER — Encounter: Payer: Medicaid Other | Admitting: Obstetrics & Gynecology

## 2019-02-23 ENCOUNTER — Telehealth: Payer: Self-pay | Admitting: Obstetrics & Gynecology

## 2019-02-23 NOTE — Telephone Encounter (Signed)

## 2019-02-27 ENCOUNTER — Encounter: Payer: Self-pay | Admitting: Obstetrics & Gynecology

## 2019-02-27 ENCOUNTER — Other Ambulatory Visit: Payer: Self-pay

## 2019-02-27 ENCOUNTER — Ambulatory Visit (INDEPENDENT_AMBULATORY_CARE_PROVIDER_SITE_OTHER): Payer: Medicaid Other | Admitting: Obstetrics & Gynecology

## 2019-02-27 VITALS — BP 94/59 | HR 90 | Wt 255.0 lb

## 2019-02-27 DIAGNOSIS — Z331 Pregnant state, incidental: Secondary | ICD-10-CM

## 2019-02-27 DIAGNOSIS — Z1389 Encounter for screening for other disorder: Secondary | ICD-10-CM

## 2019-02-27 DIAGNOSIS — Z3A39 39 weeks gestation of pregnancy: Secondary | ICD-10-CM

## 2019-02-27 DIAGNOSIS — Z3483 Encounter for supervision of other normal pregnancy, third trimester: Secondary | ICD-10-CM

## 2019-02-27 LAB — POCT URINALYSIS DIPSTICK OB
Glucose, UA: NEGATIVE
Ketones, UA: NEGATIVE
Nitrite, UA: NEGATIVE
POC,PROTEIN,UA: NEGATIVE

## 2019-02-27 NOTE — Progress Notes (Signed)
   LOW-RISK PREGNANCY VISIT Patient name: Chelsey Sullivan MRN [id number]  Date of birth: 13-Sep-1985 Chief Complaint:   Routine Prenatal Visit  History of Present Illness:   Chelsey Sullivan is a 33 y.o. G42P6006 female at [redacted]w[redacted]d with an Estimated Date of Delivery: 03/05/19 being seen today for ongoing management of a low-risk pregnancy.  Today she reports no complaints. Contractions: Irregular. Vag. Bleeding: None.  Movement: Present. denies leaking of fluid. Review of Systems:   Pertinent items are noted in HPI Denies abnormal vaginal discharge w/ itching/odor/irritation, headaches, visual changes, shortness of breath, chest pain, abdominal pain, severe nausea/vomiting, or problems with urination or bowel movements unless otherwise stated above. Pertinent History Reviewed:  Reviewed past medical,surgical, social, obstetrical and family history.  Reviewed problem list, medications and allergies. Physical Assessment:   Vitals:   02/27/19 1009  BP: (!) 94/59  Pulse: 90  Weight: 255 lb (115.7 kg)  Body mass index is 35.57 kg/m.        Physical Examination:   General appearance: Well appearing, and in no distress  Mental status: Alert, oriented to person, place, and time  Skin: Warm & dry  Cardiovascular: Normal heart rate noted  Respiratory: Normal respiratory effort, no distress  Abdomen: Soft, gravid, nontender  Pelvic: Cervical exam deferred  Dilation: Fingertip Effacement (%): Thick Station: Ballotable  Extremities: Edema: Trace  Fetal Status: Fetal Heart Rate (bpm): 145 Fundal Height: 39 cm Movement: Present Presentation: Vertex  Results for orders placed or performed in visit on 02/27/19 (from the past 24 hour(s))  POC Urinalysis Dipstick OB   Collection Time: 02/27/19 10:11 AM  Result Value Ref Range   Color, UA     Clarity, UA     Glucose, UA Negative Negative   Bilirubin, UA     Ketones, UA neg    Spec Grav, UA     Blood, UA trace    pH, UA     POC,PROTEIN,UA Negative  Negative, Trace, Small (1+), Moderate (2+), Large (3+), 4+   Urobilinogen, UA     Nitrite, UA neg    Leukocytes, UA Moderate (2+) (A) Negative   Appearance     Odor      Assessment & Plan:  1) Low-risk pregnancy [id number] at [redacted]w[redacted]d with an Estimated Date of Delivery: 03/05/19   2) Seizure disorder on Keppra, well controlled, pt asked if seizure disorder is indication for indution and I told her not unless someone is having uncontrolled seizure disorder  3)Grand multip, no history of PPH is noted     Meds: No orders of the defined types were placed in this encounter.  Labs/procedures today:   Plan:  Continue routine obstetrical care   Reviewed: Term labor symptoms and general obstetric precautions including but not limited to vaginal bleeding, contractions, leaking of fluid and fetal movement were reviewed in detail with the patient.  All questions were answered.  home bp cuff. Rx faxed to . Check bp weekly, let us know if >140/90.   Follow-up: Return in about 1 week (around 03/06/2019) for [place].  Orders Placed This Encounter  Procedures  . POC Urinalysis Dipstick OB   Florian Buff  02/27/2019 10:36 AM

## 2019-03-03 ENCOUNTER — Telehealth: Payer: Self-pay | Admitting: Obstetrics and Gynecology

## 2019-03-03 NOTE — Telephone Encounter (Signed)

## 2019-03-06 ENCOUNTER — Other Ambulatory Visit: Payer: Self-pay

## 2019-03-06 ENCOUNTER — Encounter: Payer: Self-pay | Admitting: Obstetrics and Gynecology

## 2019-03-06 ENCOUNTER — Ambulatory Visit (INDEPENDENT_AMBULATORY_CARE_PROVIDER_SITE_OTHER): Payer: Medicaid Other | Admitting: Obstetrics and Gynecology

## 2019-03-06 ENCOUNTER — Telehealth (HOSPITAL_COMMUNITY): Payer: Self-pay | Admitting: *Deleted

## 2019-03-06 DIAGNOSIS — Z3A40 40 weeks gestation of pregnancy: Secondary | ICD-10-CM

## 2019-03-06 DIAGNOSIS — Z641 Problems related to multiparity: Secondary | ICD-10-CM

## 2019-03-06 DIAGNOSIS — G40909 Epilepsy, unspecified, not intractable, without status epilepticus: Secondary | ICD-10-CM

## 2019-03-06 DIAGNOSIS — O99353 Diseases of the nervous system complicating pregnancy, third trimester: Secondary | ICD-10-CM

## 2019-03-06 DIAGNOSIS — Z3480 Encounter for supervision of other normal pregnancy, unspecified trimester: Secondary | ICD-10-CM

## 2019-03-06 NOTE — Telephone Encounter (Signed)
Preadmission screen  

## 2019-03-06 NOTE — Progress Notes (Addendum)
Patient ID: Chelsey Sullivan, female   DOB: 1985/10/05, 33 y.o.   MRN: [id number]    LOW-RISK PREGNANCY VISIT Patient name: Chelsey Sullivan MRN [id number]  Date of birth: 1985-09-09 Chief Complaint:   Routine Prenatal Visit (NST)  History of Present Illness:   Chelsey Sullivan is a 33 y.o. G57P6006 female at 108w1d with an Estimated Date of Delivery: 03/05/19 being seen today for ongoing management of a low-risk pregnancy. Hx of cocaine use 2 years ago. Says she hasn't used any recently, been without > 3 months. Went to [facility] 03/05/2019 thinking water had broken, cervix checked and closed. Today she reports no complaints.  .  .   . denies leaking of fluid. Review of Systems:   Pertinent items are noted in HPI Denies abnormal vaginal discharge w/ itching/odor/irritation, headaches, visual changes, shortness of breath, chest pain, abdominal pain, severe nausea/vomiting, or problems with urination or bowel movements unless otherwise stated above. Pertinent History Reviewed:  Reviewed past medical,surgical, social, obstetrical and family history.  Reviewed problem list, medications and allergies. Physical Assessment:  There were no vitals filed for this visit.There is no height or weight on file to calculate BMI.        Physical Examination:   General appearance: Well appearing, and in no distress  Mental status: Alert, oriented to person, place, and time  Skin: Warm & dry  Cardiovascular: Normal heart rate noted  Respiratory: Normal respiratory effort, no distress  Abdomen: Soft, gravid, nontender  Pelvic: Cervical exam deferred         Extremities:    Fetal Status:          No results found for this or any previous visit (from the past 24 hour(s)).  Assessment & Plan:  1) Low-risk pregnancy G7P6006 at [redacted]w[redacted]d with an Estimated Date of Delivery: 03/05/19     Meds: No orders of the defined types were placed in this encounter.  Labs/procedures today: NST  Plan:  Schedule IOL 03/12/2019 at 6:30 am  [place] in hospital PP 04/09/2019  Reviewed: Term labor symptoms and general obstetric precautions including but not limited to vaginal bleeding, contractions, leaking of fluid and fetal movement were reviewed in detail with the patient.  All questions were answered. Has bf cuff at home and says number are sometimes high but better some days. Is getting some >140/90.  Follow-up: No follow-ups on file.  Scheduled for induction of labor Sunday 19 July at 6:30 AM  No orders of the defined types were placed in this encounter.  By signing my name below, I, Samul Dada, attest that this documentation has been prepared under the direction and in the presence of Jonnie Kind, MD. Electronically Signed: [place]. 03/06/19. 11:40 AM.  I personally performed the services described in this documentation, which was SCRIBED in my presence. The recorded information has been reviewed and considered accurate. It has been edited as necessary during review. Jonnie Kind, MD

## 2019-03-07 ENCOUNTER — Other Ambulatory Visit: Payer: Self-pay | Admitting: Advanced Practice Midwife

## 2019-03-07 ENCOUNTER — Telehealth (HOSPITAL_COMMUNITY): Payer: Self-pay | Admitting: *Deleted

## 2019-03-07 NOTE — Telephone Encounter (Signed)
Preadmission screen  

## 2019-03-08 ENCOUNTER — Telehealth (HOSPITAL_COMMUNITY): Payer: Self-pay | Admitting: *Deleted

## 2019-03-08 NOTE — Telephone Encounter (Signed)
Preadmission screen  

## 2019-03-09 ENCOUNTER — Other Ambulatory Visit (HOSPITAL_COMMUNITY): Payer: Self-pay | Admitting: Advanced Practice Midwife

## 2019-03-09 ENCOUNTER — Other Ambulatory Visit (HOSPITAL_COMMUNITY)
Admission: RE | Admit: 2019-03-09 | Discharge: 2019-03-09 | Disposition: A | Discharge status: Home / Self Care | Payer: Medicaid Other | Source: Ambulatory Visit | Attending: Family Medicine | Admitting: Family Medicine

## 2019-03-09 ENCOUNTER — Telehealth (HOSPITAL_COMMUNITY): Payer: Self-pay | Admitting: *Deleted

## 2019-03-09 NOTE — Telephone Encounter (Signed)
Preadmission screen  

## 2019-03-10 ENCOUNTER — Other Ambulatory Visit: Payer: Self-pay | Admitting: Advanced Practice Midwife

## 2019-03-12 ENCOUNTER — Inpatient Hospital Stay (HOSPITAL_COMMUNITY): Payer: Medicaid Other

## 2019-03-12 ENCOUNTER — Inpatient Hospital Stay (HOSPITAL_COMMUNITY): Admission: AD | Admit: 2019-03-12 | Payer: Medicaid Other | Source: Home / Self Care | Admitting: Family Medicine

## 2019-05-28 ENCOUNTER — Encounter (HOSPITAL_COMMUNITY): Payer: Self-pay

## 2019-06-24 ENCOUNTER — Encounter (HOSPITAL_COMMUNITY): Payer: Self-pay | Admitting: Emergency Medicine

## 2019-06-24 ENCOUNTER — Emergency Department (HOSPITAL_COMMUNITY): Payer: Medicaid Other

## 2019-06-24 ENCOUNTER — Emergency Department (HOSPITAL_COMMUNITY)
Admission: EM | Admit: 2019-06-24 | Discharge: 2019-06-24 | Disposition: A | Discharge status: Home / Self Care | Payer: Medicaid Other | Source: Home / Self Care | Attending: Emergency Medicine | Admitting: Emergency Medicine

## 2019-06-24 ENCOUNTER — Encounter (HOSPITAL_COMMUNITY): Payer: Self-pay

## 2019-06-24 ENCOUNTER — Inpatient Hospital Stay (HOSPITAL_COMMUNITY)
Admission: EM | Admit: 2019-06-24 | Discharge: 2019-06-26 | DRG: 101 | Disposition: A | Discharge status: Home / Self Care | Payer: Medicaid Other | Attending: Family Medicine | Admitting: Family Medicine

## 2019-06-24 ENCOUNTER — Other Ambulatory Visit: Payer: Self-pay

## 2019-06-24 DIAGNOSIS — Z87891 Personal history of nicotine dependence: Secondary | ICD-10-CM | POA: Insufficient documentation

## 2019-06-24 DIAGNOSIS — Z79899 Other long term (current) drug therapy: Secondary | ICD-10-CM | POA: Insufficient documentation

## 2019-06-24 DIAGNOSIS — Z880 Allergy status to penicillin: Secondary | ICD-10-CM

## 2019-06-24 DIAGNOSIS — F32A Depression, unspecified: Secondary | ICD-10-CM | POA: Diagnosis present

## 2019-06-24 DIAGNOSIS — E860 Dehydration: Secondary | ICD-10-CM | POA: Diagnosis present

## 2019-06-24 DIAGNOSIS — N179 Acute kidney failure, unspecified: Secondary | ICD-10-CM | POA: Diagnosis present

## 2019-06-24 DIAGNOSIS — G40909 Epilepsy, unspecified, not intractable, without status epilepticus: Secondary | ICD-10-CM

## 2019-06-24 DIAGNOSIS — R569 Unspecified convulsions: Principal | ICD-10-CM

## 2019-06-24 DIAGNOSIS — G40919 Epilepsy, unspecified, intractable, without status epilepticus: Principal | ICD-10-CM | POA: Diagnosis present

## 2019-06-24 DIAGNOSIS — Z820 Family history of epilepsy and other diseases of the nervous system: Secondary | ICD-10-CM

## 2019-06-24 DIAGNOSIS — F329 Major depressive disorder, single episode, unspecified: Secondary | ICD-10-CM | POA: Diagnosis present

## 2019-06-24 DIAGNOSIS — Z9114 Patient's other noncompliance with medication regimen: Secondary | ICD-10-CM

## 2019-06-24 DIAGNOSIS — D509 Iron deficiency anemia, unspecified: Secondary | ICD-10-CM | POA: Diagnosis not present

## 2019-06-24 DIAGNOSIS — N390 Urinary tract infection, site not specified: Secondary | ICD-10-CM | POA: Diagnosis present

## 2019-06-24 DIAGNOSIS — Z20828 Contact with and (suspected) exposure to other viral communicable diseases: Secondary | ICD-10-CM | POA: Diagnosis present

## 2019-06-24 DIAGNOSIS — G9340 Encephalopathy, unspecified: Secondary | ICD-10-CM | POA: Diagnosis present

## 2019-06-24 DIAGNOSIS — T426X6A Underdosing of other antiepileptic and sedative-hypnotic drugs, initial encounter: Secondary | ICD-10-CM | POA: Diagnosis present

## 2019-06-24 DIAGNOSIS — Z8249 Family history of ischemic heart disease and other diseases of the circulatory system: Secondary | ICD-10-CM

## 2019-06-24 DIAGNOSIS — F1490 Cocaine use, unspecified, uncomplicated: Secondary | ICD-10-CM | POA: Diagnosis present

## 2019-06-24 DIAGNOSIS — F1410 Cocaine abuse, uncomplicated: Secondary | ICD-10-CM | POA: Diagnosis present

## 2019-06-24 DIAGNOSIS — Z91128 Patient's intentional underdosing of medication regimen for other reason: Secondary | ICD-10-CM

## 2019-06-24 LAB — CBC
HCT: 37 % (ref 36.0–46.0)
Hemoglobin: 10.9 g/dL — ABNORMAL LOW (ref 12.0–15.0)
MCH: 26.1 pg (ref 26.0–34.0)
MCHC: 29.5 g/dL — ABNORMAL LOW (ref 30.0–36.0)
MCV: 88.5 fL (ref 80.0–100.0)
Platelets: 271 10*3/uL (ref 150–400)
RBC: 4.18 MIL/uL (ref 3.87–5.11)
RDW: 15.8 % — ABNORMAL HIGH (ref 11.5–15.5)
WBC: 6.3 10*3/uL (ref 4.0–10.5)
nRBC: 0 % (ref 0.0–0.2)

## 2019-06-24 LAB — BASIC METABOLIC PANEL
Anion gap: 10 (ref 5–15)
BUN: 10 mg/dL (ref 6–20)
CO2: 22 mmol/L (ref 22–32)
Calcium: 8.8 mg/dL — ABNORMAL LOW (ref 8.9–10.3)
Chloride: 106 mmol/L (ref 98–111)
Creatinine, Ser: 1.09 mg/dL — ABNORMAL HIGH (ref 0.44–1.00)
GFR calc Af Amer: >60 mL/min (ref >60)
GFR calc non Af Amer: >60 mL/min (ref >60)
Glucose, Bld: 86 mg/dL (ref 70–99)
Potassium: 3.8 mmol/L (ref 3.5–5.1)
Sodium: 138 mmol/L (ref 135–145)

## 2019-06-24 LAB — MRSA PCR SCREENING: MRSA by PCR: POSITIVE — AB

## 2019-06-24 LAB — CBG MONITORING, ED: Glucose-Capillary: 86 mg/dL (ref 70–99)

## 2019-06-24 LAB — I-STAT BETA HCG BLOOD, ED (MC, WL, AP ONLY): I-stat hCG, quantitative: <5 m[IU]/mL (ref <5)

## 2019-06-24 LAB — MAGNESIUM: Magnesium: 2 mg/dL (ref 1.7–2.4)

## 2019-06-24 MED ORDER — FERROUS SULFATE 325 (65 FE) MG PO TABS
325.0000 mg | ORAL_TABLET | Freq: Every day | ORAL | Status: DC
  Administered 2019-06-25 – 2019-06-26 (×2): 325 mg via ORAL
  Filled 2019-06-24 (×2): qty 1

## 2019-06-24 MED ORDER — OXYCODONE-ACETAMINOPHEN 5-325 MG PO TABS
1.0000 | ORAL_TABLET | Freq: Once | ORAL | Status: AC
  Administered 2019-06-24: 1 via ORAL
  Filled 2019-06-24: qty 1

## 2019-06-24 MED ORDER — ONDANSETRON HCL 4 MG/2ML IJ SOLN: 4.0000 mg | Freq: Four times a day (QID) | INTRAMUSCULAR | Status: DC | PRN

## 2019-06-24 MED ORDER — LEVETIRACETAM 500 MG PO TABS: 500.0000 mg | ORAL_TABLET | Freq: Two times a day (BID) | ORAL | 3 refills | Status: DC

## 2019-06-24 MED ORDER — ACETAMINOPHEN 650 MG RE SUPP: 650.0000 mg | Freq: Four times a day (QID) | RECTAL | Status: DC | PRN

## 2019-06-24 MED ORDER — ENOXAPARIN SODIUM 60 MG/0.6ML ~~LOC~~ SOLN
50.0000 mg | SUBCUTANEOUS | Status: DC
  Administered 2019-06-24 – 2019-06-25 (×2): 50 mg via SUBCUTANEOUS
  Filled 2019-06-24 (×2): qty 0.6

## 2019-06-24 MED ORDER — SODIUM CHLORIDE 0.9 % IV BOLUS
1000.0000 mL | Freq: Once | INTRAVENOUS | Status: AC
  Administered 2019-06-24: 1000 mL via INTRAVENOUS

## 2019-06-24 MED ORDER — LEVETIRACETAM IN NACL 1500 MG/100ML IV SOLN
1500.0000 mg | Freq: Once | INTRAVENOUS | Status: AC
  Administered 2019-06-24: 1500 mg via INTRAVENOUS
  Filled 2019-06-24: qty 100

## 2019-06-24 MED ORDER — LORAZEPAM 2 MG/ML IJ SOLN
1.0000 mg | Freq: Once | INTRAMUSCULAR | Status: AC
  Administered 2019-06-24: 1 mg via INTRAVENOUS
  Filled 2019-06-24: qty 1

## 2019-06-24 MED ORDER — ONDANSETRON HCL 4 MG PO TABS: 4.0000 mg | ORAL_TABLET | Freq: Four times a day (QID) | ORAL | Status: DC | PRN

## 2019-06-24 MED ORDER — ACETAMINOPHEN 325 MG PO TABS
650.0000 mg | ORAL_TABLET | Freq: Four times a day (QID) | ORAL | Status: DC | PRN
  Administered 2019-06-25: 650 mg via ORAL
  Filled 2019-06-24 (×2): qty 2

## 2019-06-24 MED ORDER — SENNOSIDES-DOCUSATE SODIUM 8.6-50 MG PO TABS: 1.0000 | ORAL_TABLET | Freq: Every evening | ORAL | Status: DC | PRN

## 2019-06-24 MED ORDER — ALBUTEROL SULFATE (2.5 MG/3ML) 0.083% IN NEBU: 3.0000 mL | INHALATION_SOLUTION | Freq: Four times a day (QID) | RESPIRATORY_TRACT | Status: DC | PRN

## 2019-06-24 MED ORDER — SODIUM CHLORIDE 0.9 % IV SOLN
INTRAVENOUS | Status: DC
  Administered 2019-06-24 – 2019-06-26 (×4): via INTRAVENOUS

## 2019-06-24 MED ORDER — LEVETIRACETAM 500 MG PO TABS
500.0000 mg | ORAL_TABLET | Freq: Two times a day (BID) | ORAL | Status: DC
  Administered 2019-06-24 – 2019-06-26 (×4): 500 mg via ORAL
  Filled 2019-06-24 (×4): qty 1

## 2019-06-24 MED ORDER — CHLORHEXIDINE GLUCONATE CLOTH 2 % EX PADS
6.0000 | MEDICATED_PAD | Freq: Every day | CUTANEOUS | Status: DC
  Administered 2019-06-24 – 2019-06-26 (×3): 6 via TOPICAL

## 2019-06-24 MED ORDER — ZOLPIDEM TARTRATE 5 MG PO TABS: 5.0000 mg | ORAL_TABLET | Freq: Every evening | ORAL | Status: DC | PRN

## 2019-06-24 MED ORDER — LORAZEPAM 2 MG/ML IJ SOLN: 1.0000 mg | INTRAMUSCULAR | Status: DC | PRN

## 2019-06-24 NOTE — ED Triage Notes (Signed)
Pt brought in by her boyfriend who initially said the patient had a seizure.  Pt states she is numb all over, then states "I'm going numb again" seconds later.  Pt is not able to give details of when this started.  Pt keeps repeating "I'm going numb again".

## 2019-06-24 NOTE — ED Provider Notes (Addendum)
[facility] EMERGENCY DEPARTMENT Provider Note   CSN: [id number] Arrival date & time: 06/24/19  2694     History   Chief Complaint Chief Complaint  Patient presents with  . Numbness    HPI Chelsey Sullivan is a 33 y.o. female.     Patient presents to the emergency department with complaints of being numb all over.  Boyfriend reports that she was shaking all over, thinks she might of had a seizure.  Patient reports that she is numb on both sides of her body.  She has a seizure disorder but has not been taking her Keppra.  She cannot describe the numbness any further.  She is not experiencing any pain.     Past Medical History:  Diagnosis Date  . Depression   . Epilepsy [facility])     Patient Active Problem List   Diagnosis Date Noted  . Supervision of normal pregnancy 01/23/2019  . Late prenatal care in third trimester 01/23/2019  . [place] multiparity 01/23/2019  . Anemia, iron deficiency 11/04/2016  . Seizure disorder during pregnancy in third trimester ([place]) 11/03/2016    Past Surgical History:  Procedure Laterality Date  . HERNIA REPAIR     umbilical hernia repair x 2.     OB History    Gravida  7   Para  6   Term  6   Preterm      AB      Living  6     SAB      TAB      Ectopic      Multiple  0   Live Births  6            Home Medications    Prior to Admission medications   Medication Sig Start Date End Date Taking? Authorizing Provider  albuterol (PROVENTIL HFA;VENTOLIN HFA) 108 (90 BASE) MCG/ACT inhaler Inhale 2 puffs into the lungs every 6 (six) hours as needed for wheezing or shortness of breath.    [provider]  Blood Pressure Monitor MISC For regular home bp monitoring during pregnancy 01/23/19   Roma Schanz, CNM  ferrous sulfate 325 (65 FE) MG tablet Take 1 tablet (325 mg total) by mouth 2 (two) times daily with a meal. 01/24/19   Roma Schanz, CNM  levETIRAcetam (KEPPRA) 500 MG tablet Take 1 tablet (500 mg  total) by mouth 2 (two) times daily. 01/23/19   Roma Schanz, CNM  pantoprazole (PROTONIX) 20 MG tablet Take 1 tablet (20 mg total) by mouth daily. 01/23/19   Roma Schanz, CNM  Prenatal Vit-Fe Fumarate-FA (PRENATAL MULTIVITAMIN) TABS tablet Take 1 tablet by mouth daily at 12 noon. Patient not taking: Reported on 03/06/2019 11/05/16   Constant, Peggy, MD    Family History Family History  Problem Relation Age of Onset  . Hypertension Other   . Diabetes Other   . Epilepsy Other   . Seizures Sister   . Hypertension Maternal Grandmother   . Kidney disease Maternal Grandmother     Social History Social History   Tobacco Use  . Smoking status: Former Smoker    Packs/day: 1.00    Types: Cigarettes    Quit date: 12/31/2018    Years since quitting: 0.4  . Smokeless tobacco: Never Used  Substance Use Topics  . Alcohol use: Yes    Comment: occ. none since +pregnancy test.  . Drug use: No     Allergies   Amoxicillin and Penicillins  Review of Systems Review of Systems  Neurological: Positive for numbness.     Physical Exam Updated Vital Signs BP 107/84 (BP Location: Left Arm)   Pulse 83   Temp 97.6 F (36.4 C) (Oral)   Resp 18   Ht 5\' 11"  (1.803 m)   Wt 100.7 kg   SpO2 100%   BMI 30.96 kg/m   Physical Exam Vitals signs and nursing note reviewed.  Constitutional:      General: She is not in acute distress.    Appearance: Normal appearance. She is well-developed.  HENT:     Head: Normocephalic and atraumatic.     Right Ear: Hearing normal.     Left Ear: Hearing normal.     Nose: Nose normal.  Eyes:     Conjunctiva/sclera: Conjunctivae normal.     Pupils: Pupils are equal, round, and reactive to light.  Neck:     Musculoskeletal: Normal range of motion and neck supple.  Cardiovascular:     Rate and Rhythm: Regular rhythm.     Heart sounds: S1 normal and S2 normal. No murmur. No friction rub. No gallop.   Pulmonary:     Effort: Pulmonary effort is  normal. No respiratory distress.     Breath sounds: Normal breath sounds.  Chest:     Chest wall: No tenderness.  Abdominal:     General: Bowel sounds are normal.     Palpations: Abdomen is soft.     Tenderness: There is no abdominal tenderness. There is no guarding or rebound. Negative signs include Murphy's sign and McBurney's sign.     Hernia: No hernia is present.  Musculoskeletal: Normal range of motion.  Skin:    General: Skin is warm and dry.     Findings: No rash.  Neurological:     Mental Status: She is alert and oriented to person, place, and time.     GCS: GCS eye subscore is 4. GCS verbal subscore is 5. GCS motor subscore is 6.     Cranial Nerves: No cranial nerve deficit.     Sensory: No sensory deficit.     Coordination: Coordination normal.  Psychiatric:        Speech: Speech normal.        Behavior: Behavior normal.        Thought Content: Thought content normal.      ED Treatments / Results  Labs (all labs ordered are listed, but only abnormal results are displayed) Labs Reviewed  CBC - Abnormal; Notable for the following components:      Result Value   Hemoglobin 10.9 (*)    MCHC 29.5 (*)    RDW 15.8 (*)    All other components within normal limits  BASIC METABOLIC PANEL - Abnormal; Notable for the following components:   Creatinine, Ser 1.09 (*)    Calcium 8.8 (*)    All other components within normal limits  I-STAT BETA HCG BLOOD, ED ([facility], [facility], [facility] ONLY)    EKG EKG Interpretation  Date/Time:  Saturday June 24 2019 05:12:58 EDT Ventricular Rate:  91 PR Interval:    QRS Duration: 81 QT Interval:  390 QTC Calculation: 480 R Axis:   21 Text Interpretation: Sinus rhythm Low voltage, precordial leads Borderline prolonged QT interval Otherwise within normal limits Confirmed by 06-07-1989 [phone]) on 06/24/2019 5:36:13 AM   Radiology No results found.  Procedures Procedures (including critical care time)  Medications Ordered in ED  Medications  sodium chloride 0.9 %  bolus 1,000 mL (1,000 mLs Intravenous New Bag/Given 06/24/19 0656)  levETIRAcetam (KEPPRA) IVPB 1500 mg/ 100 mL premix (1,500 mg Intravenous New Bag/Given 06/24/19 0656)     Initial Impression / Assessment and Plan / ED Course  I have reviewed the triage vital signs and the nursing notes.  Pertinent labs & imaging results that were available during my care of the patient were reviewed by me and considered in my medical decision making (see chart for details).        Patient presents to the emergency department with numbness all over.  This followed a shaking episode that the boyfriend with likely seizure.  Patient does report a history of seizures, has been noncompliant with her Keppra.  Basic labs checked.  No headache or focal neurologic findings, do not need neuroimaging.  She has subjective bilateral and symmetric numbness that comes and goes.  Etiology is unclear, but certainly may be related to seizures.  As she is noncompliant with her Keppra, will give Keppra IV load.  Will need to follow-up with her neurologist.  Recheck at 7:15 AM -patient feeling better.  No further numbness.  Neurologic exam is normal.  She reports that she does not have her Keppra, will provide with prescription.  Final Clinical Impressions(s) / ED Diagnoses   Final diagnoses:  Seizure disorder [facility](HCC)    ED Discharge Orders    None       Lonzy Mato, Canary Brimhristopher J, MD 06/24/19 [phone]    Gilda CreasePollina, Charday Capetillo J, MD 06/24/19 [phone]

## 2019-06-24 NOTE — ED Provider Notes (Signed)
[facility] Provider [facility]   CSN: [id number] Arrival date & time: 06/24/19  1118     History   Chief Complaint Chief Complaint  Patient presents with  . Seizures    HPI Chelsey Sullivan is a 33 y.o. female listed history epilepsy, depression.  Level 5 caveat for altered mental status, history obtained from triage note and RN, no family at bedside, EMS has left.  It appears patient presented around 5 AM this morning for complaint of numbness and seizure-like activity, basics labs were obtained and patient was given Keppra load and new prescription, it appears she has been noncompliant with Keppra in the past. She was discharged around 8am this morning. Appears she may have had another seizure; patient appears postictal on evaluation.  She is groaning, moves all 4 extremities equally, no facial droop, nods and grunts to questions and reports that she is feeling cold.  Work-up from this morning includes: EKG reviewed by Dr. Blinda LeatherwoodPollina without acute findings, borderline prolonged QT with QTC 480 CBC, nonacute BMP with mild elevation of creatinine 1.09, nonacute Negative beta-hCG She received 150 mg Keppra and 1 L fluid bolus.     HPI  Past Medical History:  Diagnosis Date  . Depression   . Epilepsy [facility](HCC)     Patient Active Problem List   Diagnosis Date Noted  . Supervision of normal pregnancy 01/23/2019  . Late prenatal care in third trimester 01/23/2019  . [place] multiparity 01/23/2019  . Anemia, iron deficiency 11/04/2016  . Seizure disorder during pregnancy in third trimester (HCC) 11/03/2016    Past Surgical History:  Procedure Laterality Date  . HERNIA REPAIR     umbilical hernia repair x 2.     OB History    Gravida  7   Para  6   Term  6   Preterm      AB      Living  6     SAB      TAB      Ectopic      Multiple  0   Live Births  6            Home Medications    Prior to Admission medications   Medication Sig  Start Date End Date Taking? Authorizing Provider  albuterol (PROVENTIL HFA;VENTOLIN HFA) 108 (90 BASE) MCG/ACT inhaler Inhale 2 puffs into the lungs every 6 (six) hours as needed for wheezing or shortness of breath.    [provider]  Blood Pressure Monitor MISC For regular home bp monitoring during pregnancy 01/23/19   Cheral MarkerBooker, Kimberly R, CNM  ferrous sulfate 325 (65 FE) MG tablet Take 1 tablet (325 mg total) by mouth 2 (two) times daily with a meal. 01/24/19   Cheral MarkerBooker, Kimberly R, CNM  levETIRAcetam (KEPPRA) 500 MG tablet Take 1 tablet (500 mg total) by mouth 2 (two) times daily. 06/24/19   Gilda CreasePollina, Christopher J, MD  pantoprazole (PROTONIX) 20 MG tablet Take 1 tablet (20 mg total) by mouth daily. 01/23/19   Cheral MarkerBooker, Kimberly R, CNM  Prenatal Vit-Fe Fumarate-FA (PRENATAL MULTIVITAMIN) TABS tablet Take 1 tablet by mouth daily at 12 noon. Patient not taking: Reported on 03/06/2019 11/05/16   Constant, Peggy, MD    Family History Family History  Problem Relation Age of Onset  . Hypertension Other   . Diabetes Other   . Epilepsy Other   . Seizures Sister   . Hypertension Maternal Grandmother   . Kidney disease Maternal Grandmother  Social History Social History   Tobacco Use  . Smoking status: Former Smoker    Packs/day: 1.00    Types: Cigarettes    Quit date: 12/31/2018    Years since quitting: 0.4  . Smokeless tobacco: Never Used  Substance Use Topics  . Alcohol use: Yes    Comment: occ. none since +pregnancy test.  . Drug use: No     Allergies   Amoxicillin and Penicillins   Review of Systems Review of Systems  Unable to perform ROS: Mental status change     Physical Exam Updated Vital Signs BP 115/72   Pulse 64   Temp 98.2 F (36.8 C) (Oral)   Resp 12   Ht 5\' 7"  (1.702 m)   Wt 100.7 kg   SpO2 98%   BMI 34.77 kg/m   Physical Exam Constitutional:      General: She is not in acute distress.    Appearance: Normal appearance. She is well-developed. She is  not ill-appearing or diaphoretic.     Comments: Tired appearing  HENT:     Head: Normocephalic and atraumatic. No raccoon eyes or Battle's sign.     Jaw: There is normal jaw occlusion. No trismus.     Right Ear: External ear normal.     Left Ear: External ear normal.     Nose: Nose normal.  Eyes:     General: Vision grossly intact. Gaze aligned appropriately.     Pupils: Pupils are equal, round, and reactive to light.  Neck:     Musculoskeletal: Normal range of motion.     Trachea: Trachea and phonation normal. No tracheal deviation.  Pulmonary:     Effort: Pulmonary effort is normal. No respiratory distress.  Abdominal:     General: There is no distension.     Palpations: Abdomen is soft.     Tenderness: There is no abdominal tenderness. There is no guarding or rebound.  Musculoskeletal: Normal range of motion.  Skin:    General: Skin is warm and dry.  Neurological:     Mental Status: She is alert.     GCS: GCS eye subscore is 4. GCS verbal subscore is 5. GCS motor subscore is 6.     Comments: Patient initially sleeping, somnolent upon arousal.  Progressively improved as she became less postictal.  On reassessment speech is clear and goal oriented, she follows commands. Major Cranial nerves without deficit, no facial droop Moves extremities without ataxia, coordination intact  Psychiatric:        Behavior: Behavior normal.    ED Treatments / Results  Labs (all labs ordered are listed, but only abnormal results are displayed) Labs Reviewed  SARS CORONAVIRUS 2 (TAT 6-24 HRS)  MAGNESIUM  CBG MONITORING, ED    EKG None  Radiology Ct Head Wo Contrast  Result Date: 06/24/2019 CLINICAL DATA:  Seizure EXAM: CT HEAD WITHOUT CONTRAST TECHNIQUE: Contiguous axial images were obtained from the base of the skull through the vertex without intravenous contrast. COMPARISON:  11/03/2016 FINDINGS: Brain: No evidence of acute infarction, hemorrhage, hydrocephalus, extra-axial  collection or mass lesion/mass effect. Vascular: No hyperdense vessel or unexpected calcification. Skull: Normal. Negative for fracture or focal lesion. Sinuses/Orbits: No acute finding. Other: None. IMPRESSION: No acute intracranial pathology. Electronically Signed   By: Eddie Candle M.D.   On: 06/24/2019 13:06    Procedures Procedures (including critical care time)  Medications Ordered in ED Medications  LORazepam (ATIVAN) injection 1 mg (has no administration in time range)  Initial Impression / Assessment and Plan / ED Course  I have reviewed the triage vital signs and the nursing notes.  Pertinent labs & imaging results that were available during my care of the patient were reviewed by me and considered in my medical decision making (see chart for details).  Clinical Course as of Jun 24 1443  Sat Jun 24, 2019  1422 Dr Laural Benes   [BM]  [id number] Dr. Gerilyn Pilgrim   [BM]    Clinical Course User Index [BM] Bill Salinas, PA-C   Patient's boyfriend has arrived at bedside and is able to provide supplemental history.  Reports that patient was laying on couch today when she had a 1 minute episode of full body seizure-like activity.  Reports is her second episode today, he also reports that patient does not take Keppra at home as prescribed.  On reassessment patient is more alert and oriented at this time states that she is feeling well but remains tired and would like to sleep longer.  Discussed plan of observation admission with patient and she is agreeable at this time.  CT head:    IMPRESSION:  No acute intracranial pathology.   CBG 86 Magnesium 2.0 - Consult placed to hospitalist for observation admission for recurrent seizures. Case discussed with Dr. Adriana Simas who agrees with workup and admission at this time. - Discussed case with hospitalist Dr. Laural Benes who is asked for neurologist input prior to admission. - Discussed case with Dr. Gerilyn Pilgrim who advises giving patient 1 mg  Ativan and continuation of her normal dose of Keppra 500 twice daily as she has previously been noncompliant.  No indication for additional imaging at this time agrees with observation at [facility]. - Rediscussed case with Dr. Laural Benes will be seeing patient for observation admission.  Note: Portions of this report may have been transcribed using voice recognition software. Every effort was made to ensure accuracy; however, inadvertent computerized transcription errors may still be present. Final Clinical Impressions(s) / ED Diagnoses   Final diagnoses:  Seizures [facility])    ED Discharge Orders    None       Elizabeth Palau 06/24/19 1454    Donnetta Hutching, MD 06/25/19 [phone]

## 2019-06-24 NOTE — ED Triage Notes (Signed)
Pt had a seizure this morning she was just here for the same thing earlier this morning

## 2019-06-24 NOTE — Discharge Instructions (Signed)
Please start taking your Keppra and follow up with your neurologist.

## 2019-06-24 NOTE — H&P (Addendum)
History and Physical  Chelsey Sullivan DUK:[id number] DOB: 1985-11-30 DOA: 06/24/2019  Referring physician: Athena Masse  PCP: Patient, No Pcp Per   Chief Complaint: recurrent seizures  HPI: Chelsey Sullivan is a 33 y.o. female with long history of epilepsy but unfortunately has been noncompliant with taking Keppra.  She was seen in the emergency department for the first time this morning with seizure-like activity around 5 AM.  She admitted that she had not been taking her Keppra.  She had basic labs done and she was loaded with IV Keppra and given a new prescription for Keppra with instructions to follow-up outpatient with her primary care provider.  She had a normal neurological exam at that time and agreed to take the medications.  Unfortunately several hours later she returned to the emergency department with complaints of recurrent seizure activity described as groaning and moving all extremities similar to a tonic-clonic seizure activities that was witnessed by her boyfriend.  She was brought back to the ED for further evaluation.  At this time she had a CT of the head that was done and it did not show any acute findings.  He did have a pregnancy test that was negative.  She received some IV fluids.  Her labs showed mild creatinine elevation to 1.09.  Her EKG did not show any acute findings but there was some borderline prolonged QT.  Her CBC was normal with the exception of a hemoglobin of 10.5 and she does have a history of iron deficiency anemia but is not compliant with taking iron supplement.  The ED discussed the case with Dr. Merlene Laughter with neurology who advised that the patient be given a milligram of Ativan and continue her normal dose of Keppra 500 mg twice daily and to observe in the hospital.  The patient has been recommended for observation admission.  Review of Systems: All systems reviewed and apart from history of presenting illness, are negative.  Past Medical History:  Diagnosis Date  .  Depression   . Epilepsy [facility])    Past Surgical History:  Procedure Laterality Date  . HERNIA REPAIR     umbilical hernia repair x 2.   Social History:  reports that she quit smoking about 5 months ago. Her smoking use included cigarettes. She smoked 1.00 pack per day. She has never used smokeless tobacco. She reports current alcohol use. She reports that she does not use drugs.  Allergies  Allergen Reactions  . Amoxicillin Rash  . Penicillins Rash    Has patient had a PCN reaction causing immediate rash, facial/tongue/throat swelling, SOB or lightheadedness with hypotension: Yes Has patient had a PCN reaction causing severe rash involving mucus membranes or skin necrosis: No Has patient had a PCN reaction that required hospitalization No Has patient had a PCN reaction occurring within the last 10 years: Yes If all of the above answers are "NO", then may proceed with Cephalosporin use.     Family History  Problem Relation Age of Onset  . Hypertension Other   . Diabetes Other   . Epilepsy Other   . Seizures Sister   . Hypertension Maternal Grandmother   . Kidney disease Maternal Grandmother     Prior to Admission medications   Medication Sig Start Date End Date Taking? Authorizing Provider  albuterol (PROVENTIL HFA;VENTOLIN HFA) 108 (90 BASE) MCG/ACT inhaler Inhale 2 puffs into the lungs every 6 (six) hours as needed for wheezing or shortness of breath.    [provider]  Blood Pressure  Monitor MISC For regular home bp monitoring during pregnancy 01/23/19   Cheral Marker, CNM  ferrous sulfate 325 (65 FE) MG tablet Take 1 tablet (325 mg total) by mouth 2 (two) times daily with a meal. 01/24/19   Cheral Marker, CNM  levETIRAcetam (KEPPRA) 500 MG tablet Take 1 tablet (500 mg total) by mouth 2 (two) times daily. 06/24/19   Gilda Crease, MD  pantoprazole (PROTONIX) 20 MG tablet Take 1 tablet (20 mg total) by mouth daily. 01/23/19   Cheral Marker, CNM   Prenatal Vit-Fe Fumarate-FA (PRENATAL MULTIVITAMIN) TABS tablet Take 1 tablet by mouth daily at 12 noon. Patient not taking: Reported on 03/06/2019 11/05/16   Constant, Gigi Gin, MD   Physical Exam: Vitals:   06/24/19 1150 06/24/19 1156 06/24/19 1156 06/24/19 1205  BP:  115/72    Pulse:  64    Resp:    12  Temp:   98.2 F (36.8 C)   TempSrc:   Oral   SpO2:  98%    Weight: 100.7 kg     Height: 5\' 7"  (1.702 m)       General exam: Moderately built and nourished patient, lying comfortably supine on the gurney in no obvious distress.  Head, eyes and ENT: Nontraumatic and normocephalic. Pupils equally reacting to light and accommodation. Oral mucosa dry.  Mild mucosal lesions and tongue biting noted.  Neck: Supple. No JVD, carotid bruit or thyromegaly.  Lymphatics: No lymphadenopathy.  Respiratory system: Clear to auscultation. No increased work of breathing.  Cardiovascular system: S1 and S2 heard, RRR. No JVD, murmurs, gallops, clicks or pedal edema.  Gastrointestinal system: Abdomen is nondistended, soft and nontender. Normal bowel sounds heard. No organomegaly or masses appreciated.  Central nervous system: Alert and oriented. No focal neurological deficits.  Extremities: Symmetric 5 x 5 power. Peripheral pulses symmetrically felt.   Skin: No rashes or acute findings.  Musculoskeletal system: Negative exam.  Psychiatry: Pleasant and cooperative.  Labs on Admission:  Basic Metabolic Panel: Recent Labs  Lab 06/24/19 0620 06/24/19 1315  NA 138  --   K 3.8  --   CL 106  --   CO2 22  --   GLUCOSE 86  --   BUN 10  --   CREATININE 1.09*  --   CALCIUM 8.8*  --   MG  --  2.0   Liver Function Tests: No results for input(s): AST, ALT, ALKPHOS, BILITOT, PROT, ALBUMIN in the last 168 hours. No results for input(s): LIPASE, AMYLASE in the last 168 hours. No results for input(s): AMMONIA in the last 168 hours. CBC: Recent Labs  Lab 06/24/19 0620  WBC 6.3  HGB 10.9*  HCT  37.0  MCV 88.5  PLT 271   Cardiac Enzymes: No results for input(s): CKTOTAL, CKMB, CKMBINDEX, TROPONINI in the last 168 hours.  BNP (last 3 results) No results for input(s): PROBNP in the last 8760 hours. CBG: Recent Labs  Lab 06/24/19 1317  GLUCAP 86    Radiological Exams on Admission: Ct Head Wo Contrast  Result Date: 06/24/2019 CLINICAL DATA:  Seizure EXAM: CT HEAD WITHOUT CONTRAST TECHNIQUE: Contiguous axial images were obtained from the base of the skull through the vertex without intravenous contrast. COMPARISON:  11/03/2016 FINDINGS: Brain: No evidence of acute infarction, hemorrhage, hydrocephalus, extra-axial collection or mass lesion/mass effect. Vascular: No hyperdense vessel or unexpected calcification. Skull: Normal. Negative for fracture or focal lesion. Sinuses/Orbits: No acute finding. Other: None. IMPRESSION: No acute intracranial pathology. Electronically  Signed   By: Lauralyn PrimesAlex  Bibbey M.D.   On: 06/24/2019 13:06   EKG: Personally reviewed. Borderline prolonged QTc  Assessment/Plan Principal Problem:   Intractable seizures (HCC) Active Problems:   Anemia, iron deficiency   Epilepsy (HCC)   Depression   Noncompliance w/medication treatment due to intermit use of medication   1. Recurrent seizures-patient reportedly has been noncompliant with her Keppra medication and she has been reloaded with Keppra and restarted on Keppra 500 mg twice daily which is her normal dose.  She will be given lorazepam IV as per recommendations of neurology and she also will be monitored in the stepdown unit as I do worry about development of status epilepticus.  She will be monitored closely in stepdown ICU.  If she remains stable hopefully she can discharge home tomorrow.  Neurochecks have been ordered.  CT of the head has been negative for any acute findings.  MRI is not available at this facility at this time.  Neurology was consulted in the ED.  EEG has been ordered.  2. Iron deficiency  anemia-she has been restarted on iron supplement daily with breakfast. 3. Epilepsy-I ordered a prolactin level as she has had 2 recent seizures and will check another 1 in the morning.  I also ordered a urine drug screening.  I ordered a urinalysis. 4. Depression-does not seem to be on medications at this time.  She will need to follow-up with her primary care provider. 5. Mild AKI -gently hydrate overnight and recheck in a.m.  DVT Prophylaxis: Lovenox Code Status: Full Family Communication: Patient husband updated at bedside, verbalized understanding Disposition Plan: Observation in stepdown ICU  Critical Care Procedure Note Authorized and Performed by: Maryln Manuel. Glennette Galster MD  Total Critical Care time:  50 mins Due to a high probability of clinically significant, life threatening deterioration, the patient required my highest level of preparedness to intervene emergently and I personally spent this critical care time directly and personally managing the patient.  This critical care time included obtaining a history; examining the patient, pulse oximetry; ordering and review of studies; arranging urgent treatment with development of a management plan; evaluation of patient's response of treatment; frequent reassessment; and discussions with other providers.  This critical care time was performed to assess and manage the high probability of imminent and life threatening deterioration that could result in multi-organ failure.  It was exclusive of separately billable procedures and treating other patients and teaching time.    Standley Dakinslanford Amillion Macchia, MD [facility] How to contact the [facility] Attending or Consulting provider 7A - 7P or covering provider during after hours 7P -7A, for this patient?  1. Check the care team in [facility] and look for a) attending/consulting [facility] provider listed and b) the [facility] team listed 2. Log into [email or web address] and use [place]'s universal password to access. If you do not have the  password, please contact the hospital operator. 3. Locate the [facility] provider you are looking for under [facility] and page to a number that you can be directly reached. 4. If you still have difficulty reaching the provider, please page the [facility] (Director on Call) for the Hospitalists listed on amion for assistance.

## 2019-06-25 DIAGNOSIS — Z91128 Patient's intentional underdosing of medication regimen for other reason: Secondary | ICD-10-CM | POA: Diagnosis not present

## 2019-06-25 DIAGNOSIS — Z20828 Contact with and (suspected) exposure to other viral communicable diseases: Secondary | ICD-10-CM | POA: Diagnosis present

## 2019-06-25 DIAGNOSIS — T426X6A Underdosing of other antiepileptic and sedative-hypnotic drugs, initial encounter: Secondary | ICD-10-CM | POA: Diagnosis present

## 2019-06-25 DIAGNOSIS — G9340 Encephalopathy, unspecified: Secondary | ICD-10-CM | POA: Diagnosis present

## 2019-06-25 DIAGNOSIS — N390 Urinary tract infection, site not specified: Secondary | ICD-10-CM

## 2019-06-25 DIAGNOSIS — F1490 Cocaine use, unspecified, uncomplicated: Secondary | ICD-10-CM | POA: Diagnosis present

## 2019-06-25 DIAGNOSIS — R569 Unspecified convulsions: Secondary | ICD-10-CM

## 2019-06-25 DIAGNOSIS — F1410 Cocaine abuse, uncomplicated: Secondary | ICD-10-CM | POA: Diagnosis present

## 2019-06-25 DIAGNOSIS — N179 Acute kidney failure, unspecified: Secondary | ICD-10-CM | POA: Diagnosis present

## 2019-06-25 DIAGNOSIS — D509 Iron deficiency anemia, unspecified: Secondary | ICD-10-CM | POA: Diagnosis present

## 2019-06-25 DIAGNOSIS — Z880 Allergy status to penicillin: Secondary | ICD-10-CM | POA: Diagnosis not present

## 2019-06-25 DIAGNOSIS — E860 Dehydration: Secondary | ICD-10-CM | POA: Diagnosis present

## 2019-06-25 DIAGNOSIS — G40919 Epilepsy, unspecified, intractable, without status epilepticus: Secondary | ICD-10-CM | POA: Diagnosis present

## 2019-06-25 DIAGNOSIS — Z8249 Family history of ischemic heart disease and other diseases of the circulatory system: Secondary | ICD-10-CM | POA: Diagnosis not present

## 2019-06-25 DIAGNOSIS — F329 Major depressive disorder, single episode, unspecified: Secondary | ICD-10-CM | POA: Diagnosis present

## 2019-06-25 DIAGNOSIS — Z87891 Personal history of nicotine dependence: Secondary | ICD-10-CM | POA: Diagnosis not present

## 2019-06-25 DIAGNOSIS — Z820 Family history of epilepsy and other diseases of the nervous system: Secondary | ICD-10-CM | POA: Diagnosis not present

## 2019-06-25 LAB — COMPREHENSIVE METABOLIC PANEL
ALT: 19 U/L (ref 0–44)
AST: 15 U/L (ref 15–41)
Albumin: 3.1 g/dL — ABNORMAL LOW (ref 3.5–5.0)
Alkaline Phosphatase: 62 U/L (ref 38–126)
Anion gap: 9 (ref 5–15)
BUN: 7 mg/dL (ref 6–20)
CO2: 21 mmol/L — ABNORMAL LOW (ref 22–32)
Calcium: 8.5 mg/dL — ABNORMAL LOW (ref 8.9–10.3)
Chloride: 112 mmol/L — ABNORMAL HIGH (ref 98–111)
Creatinine, Ser: 0.93 mg/dL (ref 0.44–1.00)
GFR calc Af Amer: >60 mL/min (ref >60)
GFR calc non Af Amer: >60 mL/min (ref >60)
Glucose, Bld: 82 mg/dL (ref 70–99)
Potassium: 3.6 mmol/L (ref 3.5–5.1)
Sodium: 142 mmol/L (ref 135–145)
Total Bilirubin: 0.6 mg/dL (ref 0.3–1.2)
Total Protein: 6.4 g/dL — ABNORMAL LOW (ref 6.5–8.1)

## 2019-06-25 LAB — URINALYSIS, ROUTINE W REFLEX MICROSCOPIC
Bacteria, UA: NONE SEEN
Bilirubin Urine: NEGATIVE
Glucose, UA: NEGATIVE mg/dL
Ketones, ur: 5 mg/dL — AB
Leukocytes,Ua: NEGATIVE
Nitrite: POSITIVE — AB
Protein, ur: 30 mg/dL — AB
RBC / HPF: >50 RBC/hpf — ABNORMAL HIGH (ref 0–5)
Specific Gravity, Urine: 1.023 (ref 1.005–1.030)
pH: 5 (ref 5.0–8.0)

## 2019-06-25 LAB — GLUCOSE, CAPILLARY: Glucose-Capillary: 95 mg/dL (ref 70–99)

## 2019-06-25 LAB — SARS CORONAVIRUS 2 (TAT 6-24 HRS): SARS Coronavirus 2: NEGATIVE

## 2019-06-25 LAB — CBC WITH DIFFERENTIAL/PLATELET
Abs Immature Granulocytes: 0.01 10*3/uL (ref 0.00–0.07)
Basophils Absolute: 0 10*3/uL (ref 0.0–0.1)
Basophils Relative: 1 %
Eosinophils Absolute: 0.1 10*3/uL (ref 0.0–0.5)
Eosinophils Relative: 4 %
HCT: 34.5 % — ABNORMAL LOW (ref 36.0–46.0)
Hemoglobin: 10.2 g/dL — ABNORMAL LOW (ref 12.0–15.0)
Immature Granulocytes: 0 %
Lymphocytes Relative: 36 %
Lymphs Abs: 1.4 10*3/uL (ref 0.7–4.0)
MCH: 26.6 pg (ref 26.0–34.0)
MCHC: 29.6 g/dL — ABNORMAL LOW (ref 30.0–36.0)
MCV: 90.1 fL (ref 80.0–100.0)
Monocytes Absolute: 0.2 10*3/uL (ref 0.1–1.0)
Monocytes Relative: 6 %
Neutro Abs: 2.1 10*3/uL (ref 1.7–7.7)
Neutrophils Relative %: 53 %
Platelets: 247 10*3/uL (ref 150–400)
RBC: 3.83 MIL/uL — ABNORMAL LOW (ref 3.87–5.11)
RDW: 15.9 % — ABNORMAL HIGH (ref 11.5–15.5)
WBC: 4 10*3/uL (ref 4.0–10.5)
nRBC: 0 % (ref 0.0–0.2)

## 2019-06-25 LAB — RAPID URINE DRUG SCREEN, HOSP PERFORMED
Amphetamines: NOT DETECTED
Barbiturates: NOT DETECTED
Benzodiazepines: NOT DETECTED
Cocaine: POSITIVE — AB
Opiates: POSITIVE — AB
Tetrahydrocannabinol: POSITIVE — AB

## 2019-06-25 LAB — MAGNESIUM: Magnesium: 2 mg/dL (ref 1.7–2.4)

## 2019-06-25 LAB — CK: Total CK: 99 U/L (ref 38–234)

## 2019-06-25 MED ORDER — KETOROLAC TROMETHAMINE 30 MG/ML IJ SOLN
30.0000 mg | Freq: Four times a day (QID) | INTRAMUSCULAR | Status: DC | PRN
  Administered 2019-06-25: 30 mg via INTRAVENOUS
  Filled 2019-06-25 (×2): qty 1

## 2019-06-25 MED ORDER — SULFAMETHOXAZOLE-TRIMETHOPRIM 800-160 MG PO TABS
1.0000 | ORAL_TABLET | Freq: Two times a day (BID) | ORAL | Status: DC
  Administered 2019-06-25 – 2019-06-26 (×2): 1 via ORAL
  Filled 2019-06-25 (×2): qty 1

## 2019-06-25 NOTE — Progress Notes (Signed)
PROGRESS NOTE    Chelsey Sullivan  NIO:[id number] DOB: 1986/08/16 DOA: 06/24/2019 PCP: Patient, No Pcp Per    Brief Narrative:  33 year old female with a history of epilepsy, noncompliance, admitted to the hospital with recurrent seizures.  Found to be dehydrated with possible urinary tract infection.  Urine toxicology screen positive for cocaine.  Admitted for further monitoring.  EEG pending.  Neurology consult pending.   Assessment & Plan:   Principal Problem:   Intractable seizures (HCC) Active Problems:   Anemia, iron deficiency   Epilepsy (HCC)   Depression   Noncompliance w/medication treatment due to intermit use of medication   Seizures (HCC)   Urinary tract infection   Cocaine use   Acute encephalopathy   1. Seizure disorder.  Patient has known seizure disorder and has reportedly been noncompliant with her Keppra dosing.  She has not had seizures in quite some time.  She has had multiple seizures in the past 24 to 48 hours.  Currently on Keppra.  EEG has been ordered.  CT head was unrevealing.  She has possible UTI which could be contributing to lowering her seizure threshold.  Urine drug screen is also positive for cocaine.  Will request neurology input. 2. Possible urinary tract infection.  Urine is tea colored.  Check CK.  Started on Bactrim.  Continue IV fluids. 3. Acute encephalopathy.  Possibly multifactorial secondary to seizures as well as possible urinary tract infection.  She may have some degree of dehydration.  She does not appear to be back to her baseline as of yet.  She is lethargic.  Continue to monitor. 4. Cocaine use.  Urine tox screen positive for cocaine.  Counseled on the dangers of illicit drug use. 5. Iron deficiency anemia.  On daily iron supplementation.  Hemoglobin stable. 6. Mild acute kidney injury.  Improved with IV hydration.   DVT prophylaxis: Lovenox Code Status: Full code Family Communication: Discussed with significant other at the bedside  Disposition Plan: Discharge home once work-up is complete   Consultants:     Procedures:     Antimicrobials:   Bactrim 11/1 >   Subjective: Patient remains very lethargic.  She does open her eyes and engage in conversation.  She says she feels very weak and has difficulty moving her arms.  Staff has noted that she has been moving her arm spontaneously.  Urine is very dark, tea colored.  Objective: Vitals:   06/25/19 1515 06/25/19 1530 06/25/19 1642 06/25/19 1645  BP: 98/68 99/77  102/68  Pulse: 80 83  82  Resp: 18 16  16   Temp:   98.1 F (36.7 C)   TempSrc:   Oral   SpO2: 100% 100%  100%  Weight:      Height:        Intake/Output Summary (Last 24 hours) at 06/25/2019 1943 Last data filed at 06/25/2019 1859 Gross per 24 hour  Intake 1903.23 ml  Output 733 ml  Net 1170.23 ml   Filed Weights   06/24/19 1150 06/25/19 0416  Weight: 100.7 kg 103.9 kg    Examination:  General exam: Appears calm and comfortable  Respiratory system: Clear to auscultation. Respiratory effort normal. Cardiovascular system: S1 & S2 heard, RRR. No JVD, murmurs, rubs, gallops or clicks. No pedal edema. Gastrointestinal system: Abdomen is nondistended, soft and nontender. No organomegaly or masses felt. Normal bowel sounds heard. Central nervous system: Somnolent no focal neurological deficits. Extremities: Symmetric 5 x 5 power. Skin: No rashes, lesions or ulcers Psychiatry: Somnolent  Data Reviewed: I have personally reviewed following labs and imaging studies  CBC: Recent Labs  Lab 06/24/19 0620 06/25/19 0423  WBC 6.3 4.0  NEUTROABS  --  2.1  HGB 10.9* 10.2*  HCT 37.0 34.5*  MCV 88.5 90.1  PLT 271 247   Basic Metabolic Panel: Recent Labs  Lab 06/24/19 0620 06/24/19 1315 06/25/19 0423  NA 138  --  142  K 3.8  --  3.6  CL 106  --  112*  CO2 22  --  21*  GLUCOSE 86  --  82  BUN 10  --  7  CREATININE 1.09*  --  0.93  CALCIUM 8.8*  --  8.5*  MG  --  2.0 2.0    GFR: Estimated Creatinine Clearance: 107.6 mL/min (by C-G formula based on SCr of 0.93 mg/dL). Liver Function Tests: Recent Labs  Lab 06/25/19 0423  AST 15  ALT 19  ALKPHOS 62  BILITOT 0.6  PROT 6.4*  ALBUMIN 3.1*   No results for input(s): LIPASE, AMYLASE in the last 168 hours. No results for input(s): AMMONIA in the last 168 hours. Coagulation Profile: No results for input(s): INR, PROTIME in the last 168 hours. Cardiac Enzymes: Recent Labs  Lab 06/25/19 1654  CKTOTAL 99   BNP (last 3 results) No results for input(s): PROBNP in the last 8760 hours. HbA1C: No results for input(s): HGBA1C in the last 72 hours. CBG: Recent Labs  Lab 06/24/19 1317 06/25/19 1645  GLUCAP 86 95   Lipid Profile: No results for input(s): CHOL, HDL, LDLCALC, TRIG, CHOLHDL, LDLDIRECT in the last 72 hours. Thyroid Function Tests: No results for input(s): TSH, T4TOTAL, FREET4, T3FREE, THYROIDAB in the last 72 hours. Anemia Panel: No results for input(s): VITAMINB12, FOLATE, FERRITIN, TIBC, IRON, RETICCTPCT in the last 72 hours. Sepsis Labs: No results for input(s): PROCALCITON, LATICACIDVEN in the last 168 hours.  Recent Results (from the past 240 hour(s))  SARS CORONAVIRUS 2 (TAT 6-24 HRS) Nasopharyngeal Nasopharyngeal Swab     Status: None   Collection Time: 06/24/19  3:42 PM   Specimen: Nasopharyngeal Swab  Result Value Ref Range Status   SARS Coronavirus 2 NEGATIVE NEGATIVE Final    Comment: (NOTE) SARS-CoV-2 target nucleic acids are NOT DETECTED. The SARS-CoV-2 RNA is generally detectable in upper and lower respiratory specimens during the acute phase of infection. Negative results do not preclude SARS-CoV-2 infection, do not rule out co-infections with other pathogens, and should not be used as the sole basis for treatment or other patient management decisions. Negative results must be combined with clinical observations, patient history, and epidemiological information. The  expected result is Negative. Fact Sheet for Patients: HairSlick.nohttps://www.fda.gov/media/138098/download Fact Sheet for Healthcare Providers: quierodirigir.comhttps://www.fda.gov/media/138095/download This test is not yet approved or cleared by the [place] FDA and  has been authorized for detection and/or diagnosis of SARS-CoV-2 by FDA under an Emergency Use Authorization (EUA). This EUA will remain  in effect (meaning this test can be used) for the duration of the COVID-19 declaration under Section 56 4(b)(1) of the Act, 21 U.S.C. section 360bbb-3(b)(1), unless the authorization is terminated or revoked sooner. Performed at [facility], [address] [place], [place] [address]   MRSA PCR Screening     Status: Abnormal   Collection Time: 06/24/19  6:40 PM   Specimen: Nasal Mucosa; Nasopharyngeal  Result Value Ref Range Status   MRSA by PCR POSITIVE (A) NEGATIVE Final    Comment:  The GeneXpert MRSA Assay (FDA approved for NASAL specimens only), is one component of a comprehensive MRSA colonization surveillance program. It is not intended to diagnose MRSA infection nor to guide or monitor treatment for MRSA infections. RESULT CALLED TO, READ BACK BY AND VERIFIED WITH: FENSKY, NURSE AT 2211 ON 06/24/2019 BY EVA Performed at [facility], [address], [place], [place] [address]          Radiology Studies: Ct Head Wo Contrast  Result Date: 06/24/2019 CLINICAL DATA:  Seizure EXAM: CT HEAD WITHOUT CONTRAST TECHNIQUE: Contiguous axial images were obtained from the base of the skull through the vertex without intravenous contrast. COMPARISON:  11/03/2016 FINDINGS: Brain: No evidence of acute infarction, hemorrhage, hydrocephalus, extra-axial collection or mass lesion/mass effect. Vascular: No hyperdense vessel or unexpected calcification. Skull: Normal. Negative for fracture or focal lesion. Sinuses/Orbits: No acute finding. Other: None. IMPRESSION: No acute intracranial pathology.  Electronically Signed   By: Eddie Candle M.D.   On: 06/24/2019 13:06        Scheduled Meds: . Chlorhexidine Gluconate Cloth  6 each Topical Daily  . enoxaparin (LOVENOX) injection  50 mg Subcutaneous Q24H  . ferrous sulfate  325 mg Oral Q breakfast  . levETIRAcetam  500 mg Oral BID  . sulfamethoxazole-trimethoprim  1 tablet Oral Q12H   Continuous Infusions: . sodium chloride 100 mL/hr at 06/25/19 1859     LOS: 0 days    Time spent: 75mins    Kathie Dike, MD [facility]   If 7PM-7AM, please contact night-coverage [email or web address]  06/25/2019, 7:43 PM

## 2019-06-25 NOTE — Progress Notes (Signed)
During neuro exam and while in and out of room pt will answer questions and then look at significant other waiting for his reaction. Pt able to state her name, correct birth month, but states the year and her age incorrectly, then looks at significant other. Pt able to hold water cup and drink, but when told to reach for the cup and grasp she states she cannot. Then pt grips this RN's hands equally bilaterally for neuro check.

## 2019-06-25 NOTE — Progress Notes (Signed)
EEG called, stated pt would have EEG done tomorrow.

## 2019-06-26 ENCOUNTER — Inpatient Hospital Stay (HOSPITAL_COMMUNITY)
Admit: 2019-06-26 | Discharge: 2019-06-26 | Disposition: A | Discharge status: Home / Self Care | Payer: Medicaid Other | Attending: Family Medicine | Admitting: Family Medicine

## 2019-06-26 LAB — URINE CULTURE: Special Requests: NORMAL

## 2019-06-26 LAB — CBC
HCT: 32.3 % — ABNORMAL LOW (ref 36.0–46.0)
Hemoglobin: 9.5 g/dL — ABNORMAL LOW (ref 12.0–15.0)
MCH: 26.8 pg (ref 26.0–34.0)
MCHC: 29.4 g/dL — ABNORMAL LOW (ref 30.0–36.0)
MCV: 91.2 fL (ref 80.0–100.0)
Platelets: 217 10*3/uL (ref 150–400)
RBC: 3.54 MIL/uL — ABNORMAL LOW (ref 3.87–5.11)
RDW: 15.9 % — ABNORMAL HIGH (ref 11.5–15.5)
WBC: 4.1 10*3/uL (ref 4.0–10.5)
nRBC: 0 % (ref 0.0–0.2)

## 2019-06-26 LAB — BASIC METABOLIC PANEL
Anion gap: 7 (ref 5–15)
BUN: 8 mg/dL (ref 6–20)
CO2: 22 mmol/L (ref 22–32)
Calcium: 8.2 mg/dL — ABNORMAL LOW (ref 8.9–10.3)
Chloride: 113 mmol/L — ABNORMAL HIGH (ref 98–111)
Creatinine, Ser: 0.96 mg/dL (ref 0.44–1.00)
GFR calc Af Amer: >60 mL/min (ref >60)
GFR calc non Af Amer: >60 mL/min (ref >60)
Glucose, Bld: 94 mg/dL (ref 70–99)
Potassium: 3.8 mmol/L (ref 3.5–5.1)
Sodium: 142 mmol/L (ref 135–145)

## 2019-06-26 LAB — PROLACTIN
Prolactin: 14.4 ng/mL (ref 4.8–23.3)
Prolactin: 27.5 ng/mL — ABNORMAL HIGH (ref 4.8–23.3)

## 2019-06-26 MED ORDER — FERROUS SULFATE 325 (65 FE) MG PO TABS: 325.0000 mg | ORAL_TABLET | Freq: Two times a day (BID) | ORAL | 3 refills | Status: DC

## 2019-06-26 MED ORDER — PRENATAL MULTIVITAMIN CH: 1.0000 | ORAL_TABLET | Freq: Every day | ORAL | 11 refills | Status: DC

## 2019-06-26 MED ORDER — ALBUTEROL SULFATE HFA 108 (90 BASE) MCG/ACT IN AERS: 2.0000 | INHALATION_SPRAY | Freq: Four times a day (QID) | RESPIRATORY_TRACT | 1 refills | Status: AC | PRN

## 2019-06-26 MED ORDER — PANTOPRAZOLE SODIUM 40 MG PO TBEC: 40.0000 mg | DELAYED_RELEASE_TABLET | Freq: Every day | ORAL | 2 refills | Status: DC

## 2019-06-26 MED ORDER — SULFAMETHOXAZOLE-TRIMETHOPRIM 800-160 MG PO TABS: 1.0000 | ORAL_TABLET | Freq: Two times a day (BID) | ORAL | 0 refills | Status: AC

## 2019-06-26 MED ORDER — MUPIROCIN 2 % EX OINT
1.0000 "application " | TOPICAL_OINTMENT | Freq: Two times a day (BID) | CUTANEOUS | Status: DC
  Administered 2019-06-26: 1 via NASAL
  Filled 2019-06-26: qty 22

## 2019-06-26 MED ORDER — LEVETIRACETAM 500 MG PO TABS: 500.0000 mg | ORAL_TABLET | Freq: Two times a day (BID) | ORAL | 6 refills | Status: AC

## 2019-06-26 NOTE — Procedures (Signed)
  Vernon A. Merlene Laughter, MD     [email or web address]           HISTORY: This is a 33 year old who has a known history of epilepsy. She presents with recurrent seizures.  MEDICATIONS:  Current Facility-Administered Medications:  .  0.9 %  sodium chloride infusion, , Intravenous, Continuous, Memon, Jehanzeb, MD, Last Rate: 100 mL/hr at 06/26/19 1300 .  acetaminophen (TYLENOL) tablet 650 mg, 650 mg, Oral, Q6H PRN, 650 mg at 06/25/19 2230 **OR** acetaminophen (TYLENOL) suppository 650 mg, 650 mg, Rectal, Q6H PRN, Johnson, Clanford L, MD .  albuterol (PROVENTIL) (2.5 MG/3ML) 0.083% nebulizer solution 3 mL, 3 mL, Inhalation, Q6H PRN, Johnson, Clanford L, MD .  Chlorhexidine Gluconate Cloth 2 % PADS 6 each, 6 each, Topical, Daily, Wynetta Emery, Clanford L, MD, 6 each at 06/26/19 [phone] .  enoxaparin (LOVENOX) injection 50 mg, 50 mg, Subcutaneous, Q24H, Johnson, Clanford L, MD, 50 mg at 06/25/19 1830 .  ferrous sulfate tablet 325 mg, 325 mg, Oral, Q breakfast, Johnson, Clanford L, MD, 325 mg at 06/26/19 0742 .  ketorolac (TORADOL) 30 MG/ML injection 30 mg, 30 mg, Intravenous, Q6H PRN, Kathie Dike, MD, 30 mg at 06/25/19 2028 .  levETIRAcetam (KEPPRA) tablet 500 mg, 500 mg, Oral, BID, Johnson, Clanford L, MD, 500 mg at 06/26/19 0919 .  LORazepam (ATIVAN) injection 1 mg, 1 mg, Intravenous, Q3H PRN, Johnson, Clanford L, MD .  mupirocin ointment (BACTROBAN) 2 % 1 application, 1 application, Nasal, BID, Emokpae, Courage, MD, 1 application at 76/28/31 0921 .  ondansetron (ZOFRAN) tablet 4 mg, 4 mg, Oral, Q6H PRN **OR** ondansetron (ZOFRAN) injection 4 mg, 4 mg, Intravenous, Q6H PRN, Johnson, Clanford L, MD .  senna-docusate (Senokot-S) tablet 1 tablet, 1 tablet, Oral, QHS PRN, Johnson, Clanford L, MD .  sulfamethoxazole-trimethoprim (BACTRIM DS) 800-160 MG per tablet 1 tablet, 1 tablet, Oral, Q12H, Memon, Jehanzeb, MD, 1 tablet at 06/26/19 0919 .  zolpidem (AMBIEN) tablet 5 mg, 5 mg, Oral,  QHS PRN, Johnson, Clanford L, MD     ANALYSIS: A 16 channel recording using standard 10 20 measurements is conducted for 26 minutes.   There is a well-formed posterior dominant rhythm of 8.5 hertz which attenuates with eye-opening. There is beta activity observed in the frontal areas. Awake and drowsy architecture are noted and there is brief spindles and K complexes observed. Photic stimulation and hyperventilation are carried out without abnormal changes in the background activity. There is no focal or lateralized slowing. There is no  Epileptiform activity is noted.   IMPRESSION:  1. This is a normal recording of awake and drowsy states.      Cachet Mccutchen A. Merlene Laughter, M.D.  Diplomate, Tax adviser of Psychiatry and Neurology ( Neurology).

## 2019-06-26 NOTE — Progress Notes (Signed)
EEG complete - results pending 

## 2019-06-26 NOTE — Discharge Summary (Signed)
Chelsey Sullivan, is a 33 y.o. female  DOB [birth year]  MRN [id number].  Admission date:  06/24/2019  Admitting Physician  Cleora Fleet, MD  Discharge Date:  06/26/2019   Primary MD  Patient, No Pcp Per  Recommendations for primary care physician for things to follow:   -1)Follow-up with Neurologist Dr. Beryle Beams in 1 week for further management of your seizures at Address: [address], [place], [place] [address] Phone: [phone] Please call and make appointment   2) please take your medications as prescribed to avoid further complications  3)Per [facility] statutes, patients with seizures are not allowed to drive until they have been seizure-free for six months.   Use caution when using heavy equipment or power tools. Avoid working on ladders or at heights. Take showers instead of baths, Do not lock yourself in a room alone (i.e. bathroom).. Ensure the water temperature is not too high on the home water heater. Do not go swimming alone. When caring for infants or small children, sit down when holding, feeding, or changing them to minimize risk of injury to the child in the event you have a seizure.   Do not lock yourself in a room alone (i.e. bathroom).  Maintain good sleep hygiene. Avoid alcohol.    If patient has another seizure, call 911 and bring them back to the ED if: A.  The seizure lasts longer than 5 minutes.      B.  The patient doesn't wake shortly after the seizure or has new problems such as difficulty seeing, speaking or moving following the seizure C.  The patient was injured during the seizure D.  The patient has a temperature over 102 F (39C) E.  The patient vomited during the seizure and now is having trouble breathing  4) abstinence from illicit/street drugs including cocaine advised   Admission Diagnosis  Seizures (HCC) [R56.9]   Discharge Diagnosis   Seizures (HCC) [R56.9]    Principal Problem:   Intractable seizures (HCC) Active Problems:   Anemia, iron deficiency   Epilepsy (HCC)   Depression   Noncompliance w/medication treatment due to intermit use of medication   Seizures (HCC)   Urinary tract infection   Cocaine use   Acute encephalopathy      Past Medical History:  Diagnosis Date   Depression    Epilepsy (HCC)     Past Surgical History:  Procedure Laterality Date   HERNIA REPAIR     umbilical hernia repair x 2.       HPI  from the history and physical done on the day of admission:    - Chief Complaint: recurrent seizures  HPI: Chelsey Sullivan is a 33 y.o. female with long history of epilepsy but unfortunately has been noncompliant with taking Keppra.  She was seen in the emergency department for the first time this morning with seizure-like activity around 5 AM.  She admitted that she had not been taking her Keppra.  She had basic labs done and  she was loaded with IV Keppra and given a new prescription for Keppra with instructions to follow-up outpatient with her primary care provider.  She had a normal neurological exam at that time and agreed to take the medications.  Unfortunately several hours later she returned to the emergency department with complaints of recurrent seizure activity described as groaning and moving all extremities similar to a tonic-clonic seizure activities that was witnessed by her boyfriend.  She was brought back to the ED for further evaluation.  At this time she had a CT of the head that was done and it did not show any acute findings.  He did have a pregnancy test that was negative.  She received some IV fluids.  Her labs showed mild creatinine elevation to 1.09.  Her EKG did not show any acute findings but there was some borderline prolonged QT.  Her CBC was normal with the exception of a hemoglobin of 10.5 and she does have a history of iron deficiency anemia but is not compliant with  taking iron supplement.  The ED discussed the case with Dr. Gerilyn Pilgrim with neurology who advised that the patient be given a milligram of Ativan and continue her normal dose of Keppra 500 mg twice daily and to observe in the hospital.  The patient has been recommended for observation admission.     Hospital Course:   1) seizure disorder--- admitted with recurrent seizures in the setting of noncompliance with Keppra, Keppra has been restarted,.  EEG unremarkable -Case reviewed by Dr. Gerilyn Pilgrim, the neurologist, he advised discharge home with outpatient follow-up from a neurology standpoint - 2)Possible UTI-urine culture pending, okay to discharge home on Bactrim -Creatinine improved to 0.9  3) cocaine abuse--- discussed with social worker, patient is not interested in substance abuse rehab/counseling or help  4) chronic anemia--- continue prenatal vitamin and iron supplementation, hemoglobin currently close to baseline -No evidence of ongoing bleeding  Social-apparently recently released from jail,   Discharge Condition: stable  Follow UP--neurologist Dr. Beryle Beams as outpatient  Consults obtained - Neuro  Diet and Activity recommendation:  As advised  Discharge Instructions    Discharge Instructions    Call MD for:  difficulty breathing, headache or visual disturbances   Complete by: As directed    Call MD for:  persistant dizziness or light-headedness   Complete by: As directed    Call MD for:  persistant nausea and vomiting   Complete by: As directed    Call MD for:  temperature >100.4   Complete by: As directed    Diet - low sodium heart healthy   Complete by: As directed    Discharge instructions   Complete by: As directed    1)Follow-up with Neurologist Dr. Beryle Beams in 1 week for further management of your seizures at Address: [address], [place], [place] [address] Phone: [phone] Please call and make appointment   2) please take your  medications as prescribed to avoid further complications  3)Per [facility] statutes, patients with seizures are not allowed to drive until they have been seizure-free for six months.   Use caution when using heavy equipment or power tools. Avoid working on ladders or at heights. Take showers instead of baths, Do not lock yourself in a room alone (i.e. bathroom).. Ensure the water temperature is not too high on the home water heater. Do not go swimming alone. When caring for infants or small children, sit down when holding, feeding, or changing  them to minimize risk of injury to the child in the event you have a seizure.   Do not lock yourself in a room alone (i.e. bathroom).  Maintain good sleep hygiene. Avoid alcohol.    If patient has another seizure, call 911 and bring them back to the ED if: A.  The seizure lasts longer than 5 minutes.      B.  The patient doesn't wake shortly after the seizure or has new problems such as difficulty seeing, speaking or moving following the seizure C.  The patient was injured during the seizure D.  The patient has a temperature over 102 F (39C) E.  The patient vomited during the seizure and now is having trouble breathing  4) abstinence from illicit/street drugs including cocaine advised   Increase activity slowly   Complete by: As directed        Discharge Medications     Allergies as of 06/26/2019      Reactions   Amoxicillin Rash   Penicillins Rash   Has patient had a PCN reaction causing immediate rash, facial/tongue/throat swelling, SOB or lightheadedness with hypotension: Yes Has patient had a PCN reaction causing severe rash involving mucus membranes or skin necrosis: No Has patient had a PCN reaction that required hospitalization No Has patient had a PCN reaction occurring within the last 10 years: Yes If all of the above answers are "NO", then may proceed with Cephalosporin use.      Medication List    TAKE these medications     albuterol 108 (90 Base) MCG/ACT inhaler Commonly known as: VENTOLIN HFA Inhale 2 puffs into the lungs every 6 (six) hours as needed for wheezing or shortness of breath.   Blood Pressure Monitor Misc For regular home bp monitoring during pregnancy   ferrous sulfate 325 (65 FE) MG tablet Take 1 tablet (325 mg total) by mouth 2 (two) times daily with a meal.   levETIRAcetam 500 MG tablet Commonly known as: KEPPRA Take 1 tablet (500 mg total) by mouth 2 (two) times daily.   pantoprazole 40 MG tablet Commonly known as: PROTONIX Take 1 tablet (40 mg total) by mouth daily. What changed:   medication strength  how much to take   prenatal multivitamin Tabs tablet Take 1 tablet by mouth daily. What changed: when to take this   sulfamethoxazole-trimethoprim 800-160 MG tablet Commonly known as: BACTRIM DS Take 1 tablet by mouth 2 (two) times daily for 3 days.       Major procedures and Radiology Reports - PLEASE review detailed and final reports for all details, in brief -   Ct Head Wo Contrast  Result Date: 06/24/2019 CLINICAL DATA:  Seizure EXAM: CT HEAD WITHOUT CONTRAST TECHNIQUE: Contiguous axial images were obtained from the base of the skull through the vertex without intravenous contrast. COMPARISON:  11/03/2016 FINDINGS: Brain: No evidence of acute infarction, hemorrhage, hydrocephalus, extra-axial collection or mass lesion/mass effect. Vascular: No hyperdense vessel or unexpected calcification. Skull: Normal. Negative for fracture or focal lesion. Sinuses/Orbits: No acute finding. Other: None. IMPRESSION: No acute intracranial pathology. Electronically Signed   By: Eddie Candle M.D.   On: 06/24/2019 13:06    Micro Results   Recent Results (from the past 240 hour(s))  SARS CORONAVIRUS 2 (TAT 6-24 HRS) Nasopharyngeal Nasopharyngeal Swab     Status: None   Collection Time: 06/24/19  3:42 PM   Specimen: Nasopharyngeal Swab  Result Value Ref Range Status   SARS Coronavirus  2 NEGATIVE NEGATIVE  Final    Comment: (NOTE) SARS-CoV-2 target nucleic acids are NOT DETECTED. The SARS-CoV-2 RNA is generally detectable in upper and lower respiratory specimens during the acute phase of infection. Negative results do not preclude SARS-CoV-2 infection, do not rule out co-infections with other pathogens, and should not be used as the sole basis for treatment or other patient management decisions. Negative results must be combined with clinical observations, patient history, and epidemiological information. The expected result is Negative. Fact Sheet for Patients: HairSlick.nohttps://www.fda.gov/media/138098/download Fact Sheet for Healthcare Providers: quierodirigir.comhttps://www.fda.gov/media/138095/download This test is not yet approved or cleared by the [place] FDA and  has been authorized for detection and/or diagnosis of SARS-CoV-2 by FDA under an Emergency Use Authorization (EUA). This EUA will remain  in effect (meaning this test can be used) for the duration of the COVID-19 declaration under Section 56 4(b)(1) of the Act, 21 U.S.C. section 360bbb-3(b)(1), unless the authorization is terminated or revoked sooner. Performed at [facility], [address] [place], [place] [address]   MRSA PCR Screening     Status: Abnormal   Collection Time: 06/24/19  6:40 PM   Specimen: Nasal Mucosa; Nasopharyngeal  Result Value Ref Range Status   MRSA by PCR POSITIVE (A) NEGATIVE Final    Comment:        The GeneXpert MRSA Assay (FDA approved for NASAL specimens only), is one component of a comprehensive MRSA colonization surveillance program. It is not intended to diagnose MRSA infection nor to guide or monitor treatment for MRSA infections. RESULT CALLED TO, READ BACK BY AND VERIFIED WITH: FENSKY, NURSE AT 2211 ON 06/24/2019 BY EVA Performed at [facility], [address] [place], [place] [address]     Today   Subjective    Chelsey CarolShanika Sullivan today has no new complaints -No  further seizures -Eating or drinking well, desires discharge home          Patient has been seen and examined prior to discharge   Objective   Blood pressure 118/72, pulse 63, temperature 98.5 F (36.9 C), resp. rate 20, height 5\' 11"  (1.803 m), weight 104.3 kg, last menstrual period 06/05/2019, SpO2 100 %, unknown if currently breastfeeding.   Intake/Output Summary (Last 24 hours) at 06/26/2019 1622 Last data filed at 06/26/2019 1300 Gross per 24 hour  Intake 2591.24 ml  Output 400 ml  Net 2191.24 ml    Exam Gen:- Awake Alert, no acute distress  HEENT:- [place].AT, No sclera icterus Neck-Supple Neck,No JVD,.  Lungs-  CTAB , good air movement bilaterally  CV- S1, S2 normal, regular Abd-  +ve B.Sounds, Abd Soft, No tenderness,    Extremity/Skin:- No  edema,   good pulses Psych-affect is appropriate, oriented x3 Neuro-no new focal deficits, no tremors    Data Review   CBC w Diff:  Lab Results  Component Value Date   WBC 4.1 06/26/2019   HGB 9.5 (L) 06/26/2019   HGB 10.2 (L) 01/25/2019   HCT 32.3 (L) 06/26/2019   HCT 29.9 (L) 01/25/2019   PLT 217 06/26/2019   PLT 190 01/25/2019   LYMPHOPCT 36 06/25/2019   MONOPCT 6 06/25/2019   EOSPCT 4 06/25/2019   BASOPCT 1 06/25/2019    CMP:  Lab Results  Component Value Date   NA 142 06/26/2019   K 3.8 06/26/2019   CL 113 (H) 06/26/2019   CO2 22 06/26/2019   BUN 8 06/26/2019   CREATININE 0.96 06/26/2019   PROT 6.4 (L) 06/25/2019   ALBUMIN 3.1 (L)  06/25/2019   BILITOT 0.6 06/25/2019   ALKPHOS 62 06/25/2019   AST 15 06/25/2019   ALT 19 06/25/2019  .   Total Discharge time is about 33 minutes  Shon Hale M.D on 06/26/2019 at 4:22 PM  Go to [email or web address] -  for contact info  [facility] - Office  [phone]

## 2019-06-26 NOTE — Clinical Social Work Note (Signed)
Patient offered SA resources. Patient declined SA resources indicating, "I really don't use like that." She stated "I don't need substance abuse treatment."   Manju Kulkarni D, LCSW

## 2019-06-26 NOTE — Discharge Instructions (Signed)
1)Follow-up with Neurologist Dr. Beryle Beams in 1 week for further management of your seizures at Address: [address], [place], [place] [address] Phone: [phone] Please call and make appointment   2) please take your medications as prescribed to avoid further complications  3)Per [facility] statutes, patients with seizures are not allowed to drive until they have been seizure-free for six months.   Use caution when using heavy equipment or power tools. Avoid working on ladders or at heights. Take showers instead of baths, Do not lock yourself in a room alone (i.e. bathroom).. Ensure the water temperature is not too high on the home water heater. Do not go swimming alone. When caring for infants or small children, sit down when holding, feeding, or changing them to minimize risk of injury to the child in the event you have a seizure.   Do not lock yourself in a room alone (i.e. bathroom).  Maintain good sleep hygiene. Avoid alcohol.    If patient has another seizure, call 911 and bring them back to the ED if: A.  The seizure lasts longer than 5 minutes.      B.  The patient doesn't wake shortly after the seizure or has new problems such as difficulty seeing, speaking or moving following the seizure C.  The patient was injured during the seizure D.  The patient has a temperature over 102 F (39C) E.  The patient vomited during the seizure and now is having trouble breathing  4) abstinence from illicit/street drugs including cocaine advised   Seizure, Adult A seizure is a sudden burst of abnormal electrical activity in the brain. Seizures usually last from 30 seconds to 2 minutes. They can cause many different symptoms. Usually, seizures are not harmful unless they last a long time. What are the causes? Common causes of this condition include:  Fever or infection.  Conditions that affect the brain, such as: ? A brain abnormality that you were born with. ? A brain  or head injury. ? Bleeding in the brain. ? A tumor. ? Stroke. ? Brain disorders such as autism or cerebral palsy.  Low blood sugar.  Conditions that are passed from parent to child (are inherited).  Problems with substances, such as: ? Having a reaction to a drug or a medicine. ? Suddenly stopping the use of a substance (withdrawal). In some cases, the cause may not be known. A person who has repeated seizures over time without a clear cause has a condition called epilepsy. What increases the risk? You are more likely to get this condition if you have:  A family history of epilepsy.  Had a seizure in the past.  A brain disorder.  A history of head injury, lack of oxygen at birth, or strokes. What are the signs or symptoms? There are many types of seizures. The symptoms vary depending on the type of seizure you have. Examples of symptoms during a seizure include:  Shaking (convulsions).  Stiffness in the body.  Passing out (losing consciousness).  Head nodding.  Staring.  Not responding to sound or touch.  Loss of bladder control and bowel control. Some people have symptoms right before and right after a seizure happens. Symptoms before a seizure may include:  Fear.  Worry (anxiety).  Feeling like you may vomit (nauseous).  Feeling like the room is spinning (vertigo).  Feeling like you saw or heard something before (dj vu).  Odd tastes or smells.  Changes in how you  see. You may see flashing lights or spots. Symptoms after a seizure happens can include:  Confusion.  Sleepiness.  Headache.  Weakness on one side of the body. How is this treated? Most seizures will stop on their own in under 5 minutes. In these cases, no treatment is needed. Seizures that last longer than 5 minutes will usually need treatment. Treatment can include:  Medicines given through an IV tube.  Avoiding things that are known to cause your seizures. These can include  medicines that you take for another condition.  Medicines to treat epilepsy.  Surgery to stop the seizures. This may be needed if medicines do not help. Follow these instructions at home: Medicines  Take over-the-counter and prescription medicines only as told by your doctor.  Do not eat or drink anything that may keep your medicine from working, such as alcohol. Activity  Do not do any activities that would be dangerous if you had another seizure, like driving or swimming. Wait until your doctor says it is safe for you to do them.  If you live in the [place] ask your local DMV (department of motor vehicles) when you can drive.  Get plenty of rest. Teaching others Teach friends and family what to do when you have a seizure. They should:  Lay you on the ground.  Protect your head and body.  Loosen any tight clothing around your neck.  Turn you on your side.  Not hold you down.  Not put anything into your mouth.  Know whether or not you need emergency care.  Stay with you until you are better.  General instructions  Contact your doctor each time you have a seizure.  Avoid anything that gives you seizures.  Keep a seizure diary. Write down: ? What you think caused each seizure. ? What you remember about each seizure.  Keep all follow-up visits as told by your doctor. This is important. Contact a doctor if:  You have another seizure.  You have seizures more often.  There is any change in what happens during your seizures.  You keep having seizures with treatment.  You have symptoms of being sick or having an infection. Get help right away if:  You have a seizure that: ? Lasts longer than 5 minutes. ? Is different than seizures you had before. ? Makes it harder to breathe. ? Happens after you hurt your head.  You have any of these symptoms after a seizure: ? Not being able to speak. ? Not being able to use a part of your body. ? Confusion. ? A bad  headache.  You have two or more seizures in a row.  You do not wake up right after a seizure.  You get hurt during a seizure. These symptoms may be an emergency. Do not wait to see if the symptoms will go away. Get medical help right away. Call your local emergency services (911 in the [place] Do not drive yourself to the hospital. Summary  Seizures usually last from 30 seconds to 2 minutes. Usually, they are not harmful unless they last a long time.  Do not eat or drink anything that may keep your medicine from working, such as alcohol.  Teach friends and family what to do when you have a seizure.  Contact your doctor each time you have a seizure. This information is not intended to replace advice given to you by your health care provider. Make sure you discuss any questions you have with your health  care provider. Document Released: 01/27/2008 Document Revised: 10/28/2018 Document Reviewed: 10/28/2018 Elsevier Patient Education  2020 [employer]

## 2019-08-04 ENCOUNTER — Other Ambulatory Visit: Payer: Self-pay

## 2019-08-04 ENCOUNTER — Emergency Department (HOSPITAL_COMMUNITY)
Admission: EM | Admit: 2019-08-04 | Discharge: 2019-08-05 | Disposition: A | Discharge status: Home / Self Care | Payer: Medicaid Other | Attending: Emergency Medicine | Admitting: Emergency Medicine

## 2019-08-04 ENCOUNTER — Encounter (HOSPITAL_COMMUNITY): Payer: Self-pay

## 2019-08-04 ENCOUNTER — Emergency Department (HOSPITAL_COMMUNITY): Payer: Medicaid Other

## 2019-08-04 DIAGNOSIS — Z9114 Patient's other noncompliance with medication regimen: Secondary | ICD-10-CM | POA: Diagnosis not present

## 2019-08-04 DIAGNOSIS — F17210 Nicotine dependence, cigarettes, uncomplicated: Secondary | ICD-10-CM | POA: Insufficient documentation

## 2019-08-04 DIAGNOSIS — R569 Unspecified convulsions: Secondary | ICD-10-CM | POA: Diagnosis not present

## 2019-08-04 DIAGNOSIS — F1410 Cocaine abuse, uncomplicated: Secondary | ICD-10-CM | POA: Diagnosis not present

## 2019-08-04 LAB — CBC WITH DIFFERENTIAL/PLATELET
Abs Immature Granulocytes: 0.01 10*3/uL (ref 0.00–0.07)
Basophils Absolute: 0.1 10*3/uL (ref 0.0–0.1)
Basophils Relative: 1 %
Eosinophils Absolute: 0.1 10*3/uL (ref 0.0–0.5)
Eosinophils Relative: 1 %
HCT: 35.3 % — ABNORMAL LOW (ref 36.0–46.0)
Hemoglobin: 10.8 g/dL — ABNORMAL LOW (ref 12.0–15.0)
Immature Granulocytes: 0 %
Lymphocytes Relative: 23 %
Lymphs Abs: 1.5 10*3/uL (ref 0.7–4.0)
MCH: 26.9 pg (ref 26.0–34.0)
MCHC: 30.6 g/dL (ref 30.0–36.0)
MCV: 88 fL (ref 80.0–100.0)
Monocytes Absolute: 0.3 10*3/uL (ref 0.1–1.0)
Monocytes Relative: 5 %
Neutro Abs: 4.3 10*3/uL (ref 1.7–7.7)
Neutrophils Relative %: 70 %
Platelets: 304 10*3/uL (ref 150–400)
RBC: 4.01 MIL/uL (ref 3.87–5.11)
RDW: 14.7 % (ref 11.5–15.5)
WBC: 6.2 10*3/uL (ref 4.0–10.5)
nRBC: 0 % (ref 0.0–0.2)

## 2019-08-04 LAB — COMPREHENSIVE METABOLIC PANEL
ALT: 13 U/L (ref 0–44)
AST: 17 U/L (ref 15–41)
Albumin: 3.8 g/dL (ref 3.5–5.0)
Alkaline Phosphatase: 70 U/L (ref 38–126)
Anion gap: 10 (ref 5–15)
BUN: 8 mg/dL (ref 6–20)
CO2: 20 mmol/L — ABNORMAL LOW (ref 22–32)
Calcium: 9.1 mg/dL (ref 8.9–10.3)
Chloride: 108 mmol/L (ref 98–111)
Creatinine, Ser: 1 mg/dL (ref 0.44–1.00)
GFR calc Af Amer: >60 mL/min (ref >60)
GFR calc non Af Amer: >60 mL/min (ref >60)
Glucose, Bld: 84 mg/dL (ref 70–99)
Potassium: 3.4 mmol/L — ABNORMAL LOW (ref 3.5–5.1)
Sodium: 138 mmol/L (ref 135–145)
Total Bilirubin: 0.8 mg/dL (ref 0.3–1.2)
Total Protein: 7.5 g/dL (ref 6.5–8.1)

## 2019-08-04 LAB — ETHANOL: Alcohol, Ethyl (B): <10 mg/dL (ref <10)

## 2019-08-04 LAB — RAPID URINE DRUG SCREEN, HOSP PERFORMED
Amphetamines: NOT DETECTED
Barbiturates: NOT DETECTED
Benzodiazepines: NOT DETECTED
Cocaine: POSITIVE — AB
Opiates: NOT DETECTED
Tetrahydrocannabinol: NOT DETECTED

## 2019-08-04 LAB — I-STAT BETA HCG BLOOD, ED (MC, WL, AP ONLY): I-stat hCG, quantitative: <5 m[IU]/mL (ref <5)

## 2019-08-04 MED ORDER — LEVETIRACETAM IN NACL 1000 MG/100ML IV SOLN
1000.0000 mg | Freq: Once | INTRAVENOUS | Status: AC
  Administered 2019-08-04: 19:00:00 1000 mg via INTRAVENOUS
  Filled 2019-08-04: qty 100

## 2019-08-04 MED ORDER — LEVETIRACETAM IN NACL 1500 MG/100ML IV SOLN
1500.0000 mg | Freq: Once | INTRAVENOUS | Status: DC
  Filled 2019-08-04: qty 100

## 2019-08-04 MED ORDER — LEVETIRACETAM IN NACL 1000 MG/100ML IV SOLN: 1000.0000 mg | Freq: Once | INTRAVENOUS | Status: DC

## 2019-08-04 NOTE — ED Triage Notes (Signed)
Pt had seizure witnessed by family today and then seizure approx 30 sec witnessed by EMS full body tremor. Pt confused and belligerent at this time. Boyfriend says she has not taken keppra for over 3 years

## 2019-08-04 NOTE — ED Provider Notes (Signed)
  Provider Note MRN:  [id number]  Arrival date & time: 08/05/19    ED Course and Medical Decision Making  Assumed care from Dr. Laverta Baltimore at shift change.  Question of seizure in the setting of cocaine abuse, seems to have arrived to the emergency department high on cocaine, currently becoming more sober and appropriate for discharge.    12:33 AM update: Patient has awake, interactive, preoperative for discharge.  She explains that she is out of her Keppra, refill of this medication provided.  Procedures  Final Clinical Impressions(s) / ED Diagnoses  No diagnosis found.  ED Discharge Orders         Ordered    levETIRAcetam (KEPPRA) 500 MG tablet  2 times daily     08/05/19 0033            Discharge Instructions     You were evaluated in the Emergency Department and after careful evaluation, we did not find any emergent condition requiring admission or further testing in the hospital.  Your exam/testing today is overall reassuring.  Please avoid illicit drug use and take your Keppra medication to prevent seizures.  Please return to the Emergency Department if you experience any worsening of your condition.  We encourage you to follow up with a primary care provider.  Thank you for allowing [place] to be a part of your care.     Barth Kirks. Sedonia Small, [place] [email or web address]    Maudie Flakes, MD 08/05/19 [phone]

## 2019-08-04 NOTE — ED Provider Notes (Signed)
Emergency Department Provider Note   I have reviewed the triage vital signs and the nursing notes.   HISTORY  Chief Complaint Seizures   HPI Chelsey Sullivan is a 33 y.o. female with PMH of depression and Epilepsy but non-compliant with keppra presents to the emergency department after witnessed seizure at home.  According to EMS the patient had an approximately 32nd witnessed seizure today.  Family witnessed the event and called 911.  They report that the patient has not taken her Keppra for over 3 years but it is unknown why the patient stopped.  Patient does not recall what happened.  She is asking what happened and "what are my vital signs?"  Repetitively.  She appears postictal.  Level 5 caveat applies.   Review of most recent admit shows recently re-starting Keppra with seizure like activity. Unclear at this time if patient has re-started this medication.   Past Medical History:  Diagnosis Date  . Depression   . Epilepsy [facility])     Patient Active Problem List   Diagnosis Date Noted  . Seizures (HCC) 06/25/2019  . Urinary tract infection 06/25/2019  . Cocaine use 06/25/2019  . Acute encephalopathy 06/25/2019  . Intractable seizures (HCC) 06/24/2019  . Epilepsy (HCC)   . Depression   . Noncompliance w/medication treatment due to intermit use of medication   . Supervision of normal pregnancy 01/23/2019  . Late prenatal care in third trimester 01/23/2019  . Grand multiparity 01/23/2019  . Anemia, iron deficiency 11/04/2016  . Seizure disorder during pregnancy in third trimester (HCC) 11/03/2016    Past Surgical History:  Procedure Laterality Date  . HERNIA REPAIR     umbilical hernia repair x 2.    Allergies Amoxicillin and Penicillins  Family History  Problem Relation Age of Onset  . Hypertension Other   . Diabetes Other   . Epilepsy Other   . Seizures Sister   . Hypertension Maternal Grandmother   . Kidney disease Maternal Grandmother     Social  History Social History   Tobacco Use  . Smoking status: Current Every Day Smoker    Packs/day: 1.00    Types: Cigarettes  . Smokeless tobacco: Never Used  Substance Use Topics  . Alcohol use: Not Currently    Comment: occ. none since +pregnancy test.  . Drug use: No    Review of Systems  Level 5 caveat: Post-ictal.   ____________________________________________   PHYSICAL EXAM:  VITAL SIGNS: ED Triage Vitals  Enc Vitals Group     BP 08/04/19 1603 123/73     Pulse Rate 08/04/19 1603 80     Resp 08/04/19 1603 (!) 21     Temp 08/04/19 1603 98.1 F (36.7 C)     Temp Source 08/04/19 1603 Oral     SpO2 08/04/19 1603 100 %   Constitutional: Alert and somewhat anxious appearing and confused. No acute distress or notable focal repetitive movements.  Eyes: Conjunctivae are normal. PERRL.  Head: Atraumatic. Nose: No congestion/rhinnorhea. Mouth/Throat: Mucous membranes are moist.  Oropharynx non-erythematous. No tongue lacerations/abrasions.  Neck: No stridor.   Cardiovascular: Normal rate, regular rhythm. Good peripheral circulation. Grossly normal heart sounds.   Respiratory: Normal respiratory effort.  No retractions. Lungs CTAB. Gastrointestinal: Soft and nontender. No distention.  Musculoskeletal: No lower extremity tenderness nor edema. No gross deformities of extremities. Normal ROM of the bilateral shoulders without focal tenderness.  Neurologic:  Normal speech and language. No gross focal neurologic deficits are appreciated.  Skin:  Skin  is warm, dry and intact. No rash noted.   ____________________________________________   LABS (all labs ordered are listed, but only abnormal results are displayed)  Labs Reviewed  COMPREHENSIVE METABOLIC PANEL - Abnormal; Notable for the following components:      Result Value   Potassium 3.4 (*)    CO2 20 (*)    All other components within normal limits  CBC WITH DIFFERENTIAL/PLATELET - Abnormal; Notable for the following  components:   Hemoglobin 10.8 (*)    HCT 35.3 (*)    All other components within normal limits  RAPID URINE DRUG SCREEN, HOSP PERFORMED - Abnormal; Notable for the following components:   Cocaine POSITIVE (*)    All other components within normal limits  ETHANOL  I-STAT BETA HCG BLOOD, ED ([facility], [facility], [facility] ONLY)   ____________________________________________  EKG   EKG Interpretation  Date/Time:  Friday August 04 2019 16:07:18 EST Ventricular Rate:  74 PR Interval:    QRS Duration: 83 QT Interval:  427 QTC Calculation: 474 R Axis:   12 Text Interpretation: Sinus rhythm Low voltage, precordial leads No STEMI Confirmed by Alona BeneLong, Andon Villard [phone]([id number]) on 08/04/2019 4:19:54 PM       ____________________________________________  RADIOLOGY  CT Head Wo Contrast  Result Date: 08/04/2019 CLINICAL DATA:  Pt had seizure witnessed by family today and then seizure approx 30 sec witnessed by EMS full body tremor. Pt confused and belligerent at this time. Boyfriend says she has not taken keppra for over 3 years EXAM: CT HEAD WITHOUT CONTRAST TECHNIQUE: Contiguous axial images were obtained from the base of the skull through the vertex without intravenous contrast. COMPARISON:  06/24/2019 FINDINGS: Brain: No evidence of acute infarction, hemorrhage, hydrocephalus, extra-axial collection or mass lesion/mass effect. Vascular: No hyperdense vessel or unexpected calcification. Skull: Normal. Negative for fracture or focal lesion. Sinuses/Orbits: Globes and orbits are unremarkable. Visualized sinuses and mastoid air cells are clear. Other: None. IMPRESSION: Normal unenhanced CT scan of the brain. Electronically Signed   By: Amie Portlandavid  Ormond M.D.   On: 08/04/2019 20:33    ____________________________________________   PROCEDURES  Procedure(s) performed:   Procedures  CRITICAL CARE Performed by: Maia PlanJoshua G Nikolina Simerson Total critical care time: 35 minutes Critical care time was exclusive of separately billable  procedures and treating other patients. Critical care was necessary to treat or prevent imminent or life-threatening deterioration. Critical care was time spent personally by me on the following activities: development of treatment plan with patient and/or surrogate as well as nursing, discussions with consultants, evaluation of patient's response to treatment, examination of patient, obtaining history from patient or surrogate, ordering and performing treatments and interventions, ordering and review of laboratory studies, ordering and review of radiographic studies, pulse oximetry and re-evaluation of patient's condition.  Alona BeneJoshua Delvin Hedeen, MD Emergency Medicine  ____________________________________________   INITIAL IMPRESSION / ASSESSMENT AND PLAN / ED COURSE  Pertinent labs & imaging results that were available during my care of the patient were reviewed by me and considered in my medical decision making (see chart for details).   Patient presents to the emergency department after witnessed seizure activity.  I made several attempted phone calls to family listed in the patient's chart but no answers.  Hopefully someone will come to the emergency department who can provide additional history.  EMS gives report of noncompliance with Keppra.  Plan for screening labs and pregnancy test.  If pregnancy negative, plan for loading with Keppra and will keep in the emergency department till she is return to  mental status baseline.  Not find evidence on exam to suspect subclinical or partial seizure at this time.   06:35 PM  Reevaluated patient.  She is more alert and making some sense.  She does still have some confusion.  She is asking to be discharged but cannot get in touch with her family to come and get her.  We are not having to restrain her but I advised that we are waiting on tests and I would like to order a CT scan as she is saying she is having a headache after striking her head during the seizure.   She is able to give the history that she has been taking her Keppra but did miss today's dose "because I need to get it refilled."  I would not feel comfortable with an AMA discharge at this time.  Patient is in agreement to wait until family can come up and get her to leave and will wait on additional workup here. Denies drug use.   11:29 PM  Urine tox positive for cocaine.  I think this explains the patient's symptoms earlier and likely is the cause for her breakthrough seizure.  She is more awake and alert.  Not concerned for prolonged postictal phase.  I suspect that she was intoxicated earlier and is now sleeping.  Will allow her to call family and when they are here to take her home she can be discharged.   Care transferred to Dr. Sedonia Small.  ____________________________________________  FINAL CLINICAL IMPRESSION(S) / ED DIAGNOSES  Final diagnoses:  Seizure-like activity ([place])  Cocaine abuse ([place])     MEDICATIONS GIVEN DURING THIS VISIT:  Medications  levETIRAcetam (KEPPRA) IVPB 1000 mg/100 mL premix (0 mg Intravenous Stopped 08/04/19 1912)     NEW OUTPATIENT MEDICATIONS STARTED DURING THIS VISIT:  Discharge Medication List as of 08/05/2019 12:33 AM    START taking these medications   Details  !! levETIRAcetam (KEPPRA) 500 MG tablet Take 1 tablet (500 mg total) by mouth 2 (two) times daily., Starting Sat 08/05/2019, Print     !! - Potential duplicate medications found. Please discuss with provider.      Note:  This document was prepared using Dragon voice recognition software and may include unintentional dictation errors.  Nanda Quinton, MD, [facility] Emergency Medicine    Glyn Gerads, Wonda [facility], MD 08/05/19 1136

## 2019-08-05 MED ORDER — LEVETIRACETAM 500 MG PO TABS: 500.0000 mg | ORAL_TABLET | Freq: Two times a day (BID) | ORAL | 0 refills | Status: DC

## 2019-08-05 NOTE — Discharge Instructions (Addendum)
You were evaluated in the Emergency Department and after careful evaluation, we did not find any emergent condition requiring admission or further testing in the hospital.  Your exam/testing today is overall reassuring.  Please avoid illicit drug use and take your Keppra medication to prevent seizures.  Please return to the Emergency Department if you experience any worsening of your condition.  We encourage you to follow up with a primary care provider.  Thank you for allowing [place] to be a part of your care.

## 2019-09-13 ENCOUNTER — Emergency Department (HOSPITAL_COMMUNITY)
Admission: EM | Admit: 2019-09-13 | Discharge: 2019-09-13 | Disposition: A | Discharge status: Home / Self Care | Payer: Medicaid Other | Attending: Emergency Medicine | Admitting: Emergency Medicine

## 2019-09-13 ENCOUNTER — Encounter (HOSPITAL_COMMUNITY): Payer: Self-pay

## 2019-09-13 ENCOUNTER — Other Ambulatory Visit: Payer: Self-pay

## 2019-09-13 DIAGNOSIS — K0889 Other specified disorders of teeth and supporting structures: Secondary | ICD-10-CM | POA: Insufficient documentation

## 2019-09-13 DIAGNOSIS — M542 Cervicalgia: Secondary | ICD-10-CM | POA: Diagnosis not present

## 2019-09-13 DIAGNOSIS — M545 Low back pain: Secondary | ICD-10-CM | POA: Insufficient documentation

## 2019-09-13 DIAGNOSIS — Z79899 Other long term (current) drug therapy: Secondary | ICD-10-CM | POA: Diagnosis not present

## 2019-09-13 DIAGNOSIS — F17210 Nicotine dependence, cigarettes, uncomplicated: Secondary | ICD-10-CM | POA: Insufficient documentation

## 2019-09-13 DIAGNOSIS — M549 Dorsalgia, unspecified: Secondary | ICD-10-CM

## 2019-09-13 LAB — CBG MONITORING, ED: Glucose-Capillary: 112 mg/dL — ABNORMAL HIGH (ref 70–99)

## 2019-09-13 MED ORDER — METHOCARBAMOL 500 MG PO TABS: 500.0000 mg | ORAL_TABLET | Freq: Three times a day (TID) | ORAL | 0 refills | Status: DC

## 2019-09-13 MED ORDER — CLINDAMYCIN HCL 150 MG PO CAPS
300.0000 mg | ORAL_CAPSULE | Freq: Once | ORAL | Status: AC
  Administered 2019-09-13: 19:00:00 300 mg via ORAL
  Filled 2019-09-13: qty 2

## 2019-09-13 MED ORDER — CLINDAMYCIN HCL 150 MG PO CAPS: 300.0000 mg | ORAL_CAPSULE | Freq: Three times a day (TID) | ORAL | 0 refills | Status: DC

## 2019-09-13 MED ORDER — HYDROCODONE-ACETAMINOPHEN 5-325 MG PO TABS: ORAL_TABLET | ORAL | 0 refills | Status: DC

## 2019-09-13 MED ORDER — HYDROCODONE-ACETAMINOPHEN 5-325 MG PO TABS
1.0000 | ORAL_TABLET | Freq: Once | ORAL | Status: AC
  Administered 2019-09-13: 19:00:00 1 via ORAL
  Filled 2019-09-13: qty 1

## 2019-09-13 NOTE — ED Provider Notes (Signed)
[facility] EMERGENCY DEPARTMENT Provider Note   CSN: [id number] Arrival date & time: 09/13/19  1651     History Chief Complaint  Patient presents with  . Dental Pain    Chelsey Sullivan is a 34 y.o. female.  HPI      Chelsey Sullivan is a 34 y.o. female who presents to the Emergency Department complaining of right upper dental pain for two weeks and pain from left neck that radiates to her lower left back for one week.  She describes gradual onset of pain and sensitivity of her right upper molar.  Pain occurred while chewing.  She notes having sensitivity with sensation of cold air or liquids.  Pain to the tooth is nonradiating.  She states she does not currently have a dentist.  She denies facial swelling, fever, chills, difficulty swallowing.  Pain of her back is worse with movement and with lying supine.  Improves while at rest. She describes a throbbing pain of her neck that radiates from her left scapula down through her left back.  She denies known injury.  She is taken over-the-counter pain relievers without improvement.  She denies abdominal pain, numbness or weakness of her lower extremities, urine or bowel changes, and dysuria.   Past Medical History:  Diagnosis Date  . Depression   . Epilepsy [facility])     Patient Active Problem List   Diagnosis Date Noted  . Seizures (HCC) 06/25/2019  . Urinary tract infection 06/25/2019  . Cocaine use 06/25/2019  . Acute encephalopathy 06/25/2019  . Intractable seizures (HCC) 06/24/2019  . Epilepsy (HCC)   . Depression   . Noncompliance w/medication treatment due to intermit use of medication   . Supervision of normal pregnancy 01/23/2019  . Late prenatal care in third trimester 01/23/2019  . Grand multiparity 01/23/2019  . Anemia, iron deficiency 11/04/2016  . Seizure disorder during pregnancy in third trimester (HCC) 11/03/2016    Past Surgical History:  Procedure Laterality Date  . HERNIA REPAIR     umbilical hernia repair x 2.       OB History    Gravida  7   Para  6   Term  6   Preterm      AB      Living  6     SAB      TAB      Ectopic      Multiple  0   Live Births  6           Family History  Problem Relation Age of Onset  . Hypertension Other   . Diabetes Other   . Epilepsy Other   . Seizures Sister   . Hypertension Maternal Grandmother   . Kidney disease Maternal Grandmother     Social History   Tobacco Use  . Smoking status: Current Every Day Smoker    Packs/day: 1.00    Types: Cigarettes  . Smokeless tobacco: Never Used  Substance Use Topics  . Alcohol use: Not Currently    Comment: occ. none since +pregnancy test.  . Drug use: No    Home Medications Prior to Admission medications   Medication Sig Start Date End Date Taking? Authorizing Provider  albuterol (VENTOLIN HFA) 108 (90 Base) MCG/ACT inhaler Inhale 2 puffs into the lungs every 6 (six) hours as needed for wheezing or shortness of breath. 06/26/19   Shon Hale, MD  Blood Pressure Monitor MISC For regular home bp monitoring during pregnancy Patient not taking: Reported on  08/04/2019 01/23/19   Roma Schanz, CNM  ferrous sulfate 325 (65 FE) MG tablet Take 1 tablet (325 mg total) by mouth 2 (two) times daily with a meal. Patient not taking: Reported on 08/04/2019 06/26/19   Roxan Hockey, MD  levETIRAcetam (KEPPRA) 500 MG tablet Take 1 tablet (500 mg total) by mouth 2 (two) times daily. Patient not taking: Reported on 08/04/2019 06/26/19   Roxan Hockey, MD  levETIRAcetam (KEPPRA) 500 MG tablet Take 1 tablet (500 mg total) by mouth 2 (two) times daily. 08/05/19   Maudie Flakes, MD  pantoprazole (PROTONIX) 40 MG tablet Take 1 tablet (40 mg total) by mouth daily. Patient not taking: Reported on 08/04/2019 06/26/19   Roxan Hockey, MD  Prenatal Vit-Fe Fumarate-FA (PRENATAL MULTIVITAMIN) TABS tablet Take 1 tablet by mouth daily. Patient not taking: Reported on 08/04/2019 06/26/19   Roxan Hockey, MD    Allergies    Amoxicillin and Penicillins  Review of Systems   Review of Systems  Constitutional: Negative for appetite change and fever.  HENT: Positive for dental problem. Negative for congestion, ear pain, facial swelling, sore throat and trouble swallowing.   Eyes: Negative for pain and visual disturbance.  Gastrointestinal: Negative for nausea and vomiting.  Genitourinary: Negative for difficulty urinating, dysuria, flank pain, vaginal bleeding and vaginal discharge.  Musculoskeletal: Positive for back pain and neck pain. Negative for neck stiffness.  Skin: Negative for color change and rash.  Neurological: Negative for dizziness, seizures, facial asymmetry and headaches.  Hematological: Negative for adenopathy.    Physical Exam Updated Vital Signs BP 111/61 (BP Location: Right Arm)   Pulse 92   Temp 98.3 F (36.8 C)   Resp 16   Ht 5\' 11"  (1.803 m)   Wt 115.7 kg   LMP 08/23/2019   SpO2 100%   BMI 35.57 kg/m   Physical Exam Vitals and nursing note reviewed.  Constitutional:      Appearance: Normal appearance. She is not ill-appearing or toxic-appearing.  HENT:     Right Ear: Tympanic membrane and ear canal normal.     Left Ear: Tympanic membrane and ear canal normal.     Mouth/Throat:     Mouth: Mucous membranes are moist.     Pharynx: Oropharynx is clear. Uvula midline. No uvula swelling.     Comments: ttp of the right upper second molar with a partially avulsed tooth noted. No edema or fluctuance of the surrounding gums.  No trismus or facial edema.  Uvula is midline and non-edmatous. Neck:     Trachea: Phonation normal.  Cardiovascular:     Rate and Rhythm: Normal rate and regular rhythm.     Pulses: Normal pulses.  Pulmonary:     Effort: Pulmonary effort is normal.     Breath sounds: Normal breath sounds.  Musculoskeletal:        General: Normal range of motion.     Cervical back: No swelling, signs of trauma, torticollis or crepitus. Pain  with movement and muscular tenderness present. No spinous process tenderness. Normal range of motion.     Thoracic back: No swelling.     Lumbar back: No swelling.     Comments: Diffuse ttp of the left cervical, thoracic and lumbar paraspinal muscles including left trapezius and rhomboid muscles.  No edema.  No spinal tenderness.  Neg SLR bilaterally  Lymphadenopathy:     Cervical: No cervical adenopathy.  Skin:    General: Skin is warm.     Capillary Refill:  Capillary refill takes less than 2 seconds.     Findings: No erythema or rash.  Neurological:     General: No focal deficit present.     Mental Status: She is alert.     Sensory: Sensation is intact. No sensory deficit.     Motor: Motor function is intact. No weakness.     Gait: Gait is intact.     ED Results / Procedures / Treatments   Labs (all labs ordered are listed, but only abnormal results are displayed) Labs Reviewed  CBG MONITORING, ED - Abnormal; Notable for the following components:      Result Value   Glucose-Capillary 112 (*)    All other components within normal limits    EKG None  Radiology No results found.  Procedures Procedures (including critical care time)  Medications Ordered in ED Medications - No data to display  ED Course  I have reviewed the triage vital signs and the nursing notes.  Pertinent labs & imaging results that were available during my care of the patient were reviewed by me and considered in my medical decision making (see chart for details).    MDM Rules/Calculators/A&P                      pt here with dental pain and left upper and lower back pain.  Vitals reviewed.  No focal neuro deficits.  She is well appearing and ambulates with a steady gait.  Back pain felt to be musculoskeletal. No concerning sx's to suggest cauda equina.  Dental pain w/o concerning sx's for Ludwig's angina or clinical evidence for dental abscess.  Patient is appropriate for discharge home, agrees to  close dental follow-up.  Final Clinical Impression(s) / ED Diagnoses Final diagnoses:  Pain, dental  Musculoskeletal back pain    Rx / DC Orders ED Discharge Orders    None       Pauline Aus, PA-C 09/13/19 2350    Vanetta Mulders, MD 09/15/19 1439

## 2019-09-13 NOTE — Discharge Instructions (Addendum)
Alternate ice and heat to your neck and upper back.  You may use a rolled up towel to support your neck while sleeping.  You will need to contact one of the local dentist to arrange a follow-up appointment

## 2019-09-13 NOTE — ED Triage Notes (Signed)
Pt is having right dental pain for a while. Is also complaining of left lower back pain and shoulder pain. Pain has been going on for a week. NAD. Ambulatory .

## 2019-09-16 ENCOUNTER — Other Ambulatory Visit: Payer: Self-pay

## 2019-09-16 ENCOUNTER — Encounter (HOSPITAL_COMMUNITY): Payer: Self-pay

## 2019-09-16 ENCOUNTER — Emergency Department (HOSPITAL_COMMUNITY)
Admission: EM | Admit: 2019-09-16 | Discharge: 2019-09-16 | Disposition: A | Discharge status: Home / Self Care | Payer: Medicaid Other | Attending: Emergency Medicine | Admitting: Emergency Medicine

## 2019-09-16 DIAGNOSIS — F17210 Nicotine dependence, cigarettes, uncomplicated: Secondary | ICD-10-CM | POA: Diagnosis not present

## 2019-09-16 DIAGNOSIS — Z79899 Other long term (current) drug therapy: Secondary | ICD-10-CM | POA: Diagnosis not present

## 2019-09-16 DIAGNOSIS — F14129 Cocaine abuse with intoxication, unspecified: Secondary | ICD-10-CM | POA: Diagnosis not present

## 2019-09-16 DIAGNOSIS — F1410 Cocaine abuse, uncomplicated: Secondary | ICD-10-CM

## 2019-09-16 DIAGNOSIS — M5412 Radiculopathy, cervical region: Secondary | ICD-10-CM | POA: Insufficient documentation

## 2019-09-16 DIAGNOSIS — M545 Low back pain: Secondary | ICD-10-CM | POA: Diagnosis present

## 2019-09-16 DIAGNOSIS — G40909 Epilepsy, unspecified, not intractable, without status epilepticus: Secondary | ICD-10-CM | POA: Diagnosis not present

## 2019-09-16 HISTORY — DX: Cocaine abuse, uncomplicated: F14.10

## 2019-09-16 LAB — URINALYSIS, ROUTINE W REFLEX MICROSCOPIC
Bilirubin Urine: NEGATIVE
Glucose, UA: NEGATIVE mg/dL
Hgb urine dipstick: NEGATIVE
Ketones, ur: NEGATIVE mg/dL
Nitrite: NEGATIVE
Protein, ur: NEGATIVE mg/dL
Specific Gravity, Urine: 1.015 (ref 1.005–1.030)
pH: 5 (ref 5.0–8.0)

## 2019-09-16 LAB — RAPID URINE DRUG SCREEN, HOSP PERFORMED
Amphetamines: NOT DETECTED
Barbiturates: NOT DETECTED
Benzodiazepines: NOT DETECTED
Cocaine: POSITIVE — AB
Opiates: NOT DETECTED
Tetrahydrocannabinol: NOT DETECTED

## 2019-09-16 LAB — PREGNANCY, URINE: Preg Test, Ur: NEGATIVE

## 2019-09-16 MED ORDER — KETOROLAC TROMETHAMINE 30 MG/ML IJ SOLN
30.0000 mg | Freq: Once | INTRAMUSCULAR | Status: AC
  Administered 2019-09-16: 14:00:00 30 mg via INTRAMUSCULAR
  Filled 2019-09-16: qty 1

## 2019-09-16 MED ORDER — PREDNISONE 10 MG (21) PO TBPK: ORAL_TABLET | ORAL | 0 refills | Status: DC

## 2019-09-16 MED ORDER — IBUPROFEN 600 MG PO TABS: 600.0000 mg | ORAL_TABLET | Freq: Four times a day (QID) | ORAL | 0 refills | Status: DC | PRN

## 2019-09-16 MED ORDER — DEXAMETHASONE SODIUM PHOSPHATE 10 MG/ML IJ SOLN
10.0000 mg | Freq: Once | INTRAMUSCULAR | Status: AC
  Administered 2019-09-16: 10 mg via INTRAMUSCULAR
  Filled 2019-09-16: qty 1

## 2019-09-16 NOTE — ED Triage Notes (Signed)
Pt presents to ED with complaints of recurrent lower back pain. Pt states pain has been going on for approx 1 week. Pt states she has also had episodes of numbness on left and right side starts at shoulder and goes to her toes.

## 2019-09-16 NOTE — ED Notes (Signed)
Pt reports neck pain for 2 weeks  "I don't know if its' from a seizure or a stroke"  Also reports numbness to her R and L side "comes and goes"  From Shoulders to toes   Last seizure cannot recall   Also reports she may have a pregnancy in "my tubes" although she has had "tubes tied"  Ambulates heel to toe without stagger or drift Moves ad lib without change in facial expression

## 2019-09-16 NOTE — ED Provider Notes (Signed)
[facility] EMERGENCY DEPARTMENT Provider Note   CSN: [id number] Arrival date & time: 09/16/19  1338     History Chief Complaint  Patient presents with  . Back Pain    Chelsey Sullivan is a 34 y.o. female.  Pt presents to the ED today with back pain and numbness in hands and in feet.  More numbness in right arm.  Pt has a hx of seizure d/o for which she takes keppra.  She said she's been taking it, but has been noncompliant in the past.  She has frequent seizures.  She has no neurologist in the [place] area, although has been referred to Dr. Merlene Laughter.  She last saw her neurologist when she lived in [place], [place] "awhile ago."  She did get her keppra refilled in December when she was last here for seizures and said she has enough at home.  She denies any trauma.  No new exercises or heavy lifting.  She is ambulatory.          Past Medical History:  Diagnosis Date  . Cocaine abuse ([place])   . Depression   . Epilepsy [facility])     Patient Active Problem List   Diagnosis Date Noted  . Seizures ([place]) 06/25/2019  . Urinary tract infection 06/25/2019  . Cocaine use 06/25/2019  . Acute encephalopathy 06/25/2019  . Intractable seizures ([place]) 06/24/2019  . Epilepsy ([place])   . Depression   . Noncompliance w/medication treatment due to intermit use of medication   . Supervision of normal pregnancy 01/23/2019  . Late prenatal care in third trimester 01/23/2019  . [place] multiparity 01/23/2019  . Anemia, iron deficiency 11/04/2016  . Seizure disorder during pregnancy in third trimester ([place]) 11/03/2016    Past Surgical History:  Procedure Laterality Date  . HERNIA REPAIR     umbilical hernia repair x 2.     OB History    Gravida  7   Para  6   Term  6   Preterm      AB      Living  6     SAB      TAB      Ectopic      Multiple  0   Live Births  6           Family History  Problem Relation Age of Onset  . Hypertension Other   . Diabetes  Other   . Epilepsy Other   . Seizures Sister   . Hypertension Maternal Grandmother   . Kidney disease Maternal Grandmother     Social History   Tobacco Use  . Smoking status: Current Every Day Smoker    Packs/day: 1.00    Types: Cigarettes  . Smokeless tobacco: Never Used  Substance Use Topics  . Alcohol use: Not Currently    Comment: occ. none since +pregnancy test.  . Drug use: Not Currently    Types: Cocaine    Home Medications Prior to Admission medications   Medication Sig Start Date End Date Taking? Authorizing Provider  albuterol (VENTOLIN HFA) 108 (90 Base) MCG/ACT inhaler Inhale 2 puffs into the lungs every 6 (six) hours as needed for wheezing or shortness of breath. 06/26/19   Roxan Hockey, MD  Blood Pressure Monitor MISC For regular home bp monitoring during pregnancy Patient not taking: Reported on 08/04/2019 01/23/19   Roma Schanz, CNM  clindamycin (CLEOCIN) 150 MG capsule Take 2 capsules (300 mg total) by mouth 3 (three) times daily. 09/13/19  Triplett, Tammy, PA-C  ferrous sulfate 325 (65 FE) MG tablet Take 1 tablet (325 mg total) by mouth 2 (two) times daily with a meal. Patient not taking: Reported on 08/04/2019 06/26/19   Shon Hale, MD  HYDROcodone-acetaminophen (NORCO/VICODIN) 5-325 MG tablet Take one tab po q 4 hrs prn pain 09/13/19   Triplett, Tammy, PA-C  ibuprofen (ADVIL) 600 MG tablet Take 1 tablet (600 mg total) by mouth every 6 (six) hours as needed. 09/16/19   Jacalyn Lefevre, MD  levETIRAcetam (KEPPRA) 500 MG tablet Take 1 tablet (500 mg total) by mouth 2 (two) times daily. Patient not taking: Reported on 08/04/2019 06/26/19   Shon Hale, MD  levETIRAcetam (KEPPRA) 500 MG tablet Take 1 tablet (500 mg total) by mouth 2 (two) times daily. 08/05/19   Sabas Sous, MD  methocarbamol (ROBAXIN) 500 MG tablet Take 1 tablet (500 mg total) by mouth 3 (three) times daily. 09/13/19   Triplett, Tammy, PA-C  pantoprazole (PROTONIX) 40 MG tablet  Take 1 tablet (40 mg total) by mouth daily. Patient not taking: Reported on 08/04/2019 06/26/19   Shon Hale, MD  predniSONE (STERAPRED UNI-PAK 21 TAB) 10 MG (21) TBPK tablet Take 6 tabs for 2 days, then 5 for 2 days, then 4 for 2 days, then 3 for 2 days, 2 for 2 days, then 1 for 2 days 09/16/19   Jacalyn Lefevre, MD  Prenatal Vit-Fe Fumarate-FA (PRENATAL MULTIVITAMIN) TABS tablet Take 1 tablet by mouth daily. Patient not taking: Reported on 08/04/2019 06/26/19   Shon Hale, MD    Allergies    Amoxicillin and Penicillins  Review of Systems   Review of Systems  Musculoskeletal: Positive for back pain.  Neurological: Positive for seizures and numbness.  All other systems reviewed and are negative.   Physical Exam Updated Vital Signs BP 103/69 (BP Location: Right Arm)   Pulse 67   Temp 98 F (36.7 C) (Oral)   Resp 16   Ht 5\' 11"  (1.803 m)   Wt 115.7 kg   LMP 08/23/2019   SpO2 100%   BMI 35.57 kg/m   Physical Exam Vitals and nursing note reviewed.  Constitutional:      Appearance: Normal appearance.  HENT:     Head: Normocephalic and atraumatic.     Right Ear: External ear normal.     Left Ear: External ear normal.     Nose: Nose normal.     Mouth/Throat:     Mouth: Mucous membranes are moist.     Pharynx: Oropharynx is clear.  Eyes:     Conjunctiva/sclera: Conjunctivae normal.     Pupils: Pupils are equal, round, and reactive to light.  Cardiovascular:     Rate and Rhythm: Normal rate and regular rhythm.     Pulses: Normal pulses.     Heart sounds: Normal heart sounds.  Pulmonary:     Effort: Pulmonary effort is normal.     Breath sounds: Normal breath sounds.  Abdominal:     General: Abdomen is flat. Bowel sounds are normal.     Palpations: Abdomen is soft.  Musculoskeletal:        General: Normal range of motion.     Cervical back: Normal range of motion and neck supple.  Skin:    General: Skin is warm.     Capillary Refill: Capillary refill takes  less than 2 seconds.  Neurological:     General: No focal deficit present.     Mental Status: She is alert and  oriented to person, place, and time.  Psychiatric:        Mood and Affect: Mood normal.        Behavior: Behavior normal.        Thought Content: Thought content normal.        Judgment: Judgment normal.     ED Results / Procedures / Treatments   Labs (all labs ordered are listed, but only abnormal results are displayed) Labs Reviewed  URINALYSIS, ROUTINE W REFLEX MICROSCOPIC - Abnormal; Notable for the following components:      Result Value   APPearance HAZY (*)    Leukocytes,Ua TRACE (*)    Bacteria, UA RARE (*)    All other components within normal limits  RAPID URINE DRUG SCREEN, HOSP PERFORMED - Abnormal; Notable for the following components:   Cocaine POSITIVE (*)    All other components within normal limits  PREGNANCY, URINE    EKG None  Radiology No results found.  Procedures Procedures (including critical care time)  Medications Ordered in ED Medications  dexamethasone (DECADRON) injection 10 mg (10 mg Intramuscular Given 09/16/19 1425)  ketorolac (TORADOL) 30 MG/ML injection 30 mg (30 mg Intramuscular Given 09/16/19 1426)    ED Course  I have reviewed the triage vital signs and the nursing notes.  Pertinent labs & imaging results that were available during my care of the patient were reviewed by me and considered in my medical decision making (see chart for details).    MDM Rules/Calculators/A&P                     I suspect pt has a cervical radiculopathy.  No red flags to indicate an emergent MRI.  Pt encouraged to not use cocaine.  She is told to take her keppra as directed and to f/u with neurology.  She is not happy about not getting narcotics.  She is told to return at any time for worsening of sx. Final Clinical Impression(s) / ED Diagnoses Final diagnoses:  Cervical radiculopathy  Seizure disorder (H[facility])  Cocaine abuse (H[facility])    Rx /  DC Orders ED Discharge Orders         Ordered    predniSONE (STERAPRED UNI-PAK 21 TAB) 10 MG (21) TBPK tablet     09/16/19 1508    ibuprofen (ADVIL) 600 MG tablet  Every 6 hours PRN     09/16/19 1508           Jacalyn Lefevre, MD 09/16/19 1512

## 2019-09-16 NOTE — Discharge Instructions (Signed)
Do not use cocaine.    Take your keppra as directed.

## 2019-09-25 ENCOUNTER — Emergency Department (HOSPITAL_COMMUNITY)
Admission: EM | Admit: 2019-09-25 | Discharge: 2019-09-25 | Disposition: A | Discharge status: Home / Self Care | Payer: Medicaid Other | Attending: Emergency Medicine | Admitting: Emergency Medicine

## 2019-09-25 ENCOUNTER — Encounter (HOSPITAL_COMMUNITY): Payer: Self-pay | Admitting: Emergency Medicine

## 2019-09-25 ENCOUNTER — Other Ambulatory Visit: Payer: Self-pay

## 2019-09-25 DIAGNOSIS — Z79899 Other long term (current) drug therapy: Secondary | ICD-10-CM | POA: Insufficient documentation

## 2019-09-25 DIAGNOSIS — F17210 Nicotine dependence, cigarettes, uncomplicated: Secondary | ICD-10-CM | POA: Diagnosis not present

## 2019-09-25 DIAGNOSIS — K0889 Other specified disorders of teeth and supporting structures: Secondary | ICD-10-CM | POA: Diagnosis present

## 2019-09-25 MED ORDER — IBUPROFEN 800 MG PO TABS
800.0000 mg | ORAL_TABLET | Freq: Once | ORAL | Status: DC
  Filled 2019-09-25: qty 1

## 2019-09-25 MED ORDER — CLINDAMYCIN HCL 300 MG PO CAPS: 300.0000 mg | ORAL_CAPSULE | Freq: Three times a day (TID) | ORAL | 0 refills | Status: DC

## 2019-09-25 MED ORDER — NAPROXEN 500 MG PO TABS: 500.0000 mg | ORAL_TABLET | Freq: Two times a day (BID) | ORAL | 0 refills | Status: DC | PRN

## 2019-09-25 NOTE — ED Triage Notes (Signed)
Pt c/o right sided dental pain since yesterday.  

## 2019-09-25 NOTE — Discharge Instructions (Signed)
Take the pain medication as prescribed.  Follow-up with the dentist.  Return to the ED with new or worsening symptoms.

## 2019-09-25 NOTE — ED Notes (Signed)
Went over d/c papers with pt. Pt requests something for pain before she leaves. EDP placed order for ibuprofen 800mg . Pt states "I cant take that." RN tried to inquire why. Pt would not give an answer. RN offered to see if provider would order Tylenol. Pt refused tylenol that it does not help her because she has a wisdom tooth growing in. RN encouraged that pain meds arent typically given for wisdom teeth pain. Pt left without any  Meds.

## 2019-09-25 NOTE — ED Provider Notes (Signed)
[facility] EMERGENCY DEPARTMENT Provider Note   CSN: [id number] Arrival date & time: 09/25/19  1930     History Chief Complaint  Patient presents with  . Dental Pain    Chelsey Sullivan is a 34 y.o. female.  Patient here with right lower dental pain for the past 2 days.  Believes that her wisdom tooth is coming in.  States she been taking Tylenol at home without relief.  There is been no fever, chills, nausea, vomiting.  No difficulty breathing or difficulty swallowing.  Denies any trauma to the tooth. States she does not have a Education officer, community.  The history is provided by the patient.  Dental Pain Associated symptoms: no fever and no headaches        Past Medical History:  Diagnosis Date  . Cocaine abuse ([facility])   . Depression   . Epilepsy [facility])     Patient Active Problem List   Diagnosis Date Noted  . Seizures ([facility]) 06/25/2019  . Urinary tract infection 06/25/2019  . Cocaine use 06/25/2019  . Acute encephalopathy 06/25/2019  . Intractable seizures ([facility]) 06/24/2019  . Epilepsy ([facility])   . Depression   . Noncompliance w/medication treatment due to intermit use of medication   . Supervision of normal pregnancy 01/23/2019  . Late prenatal care in third trimester 01/23/2019  . [place] multiparity 01/23/2019  . Anemia, iron deficiency 11/04/2016  . Seizure disorder during pregnancy in third trimester ([facility]) 11/03/2016    Past Surgical History:  Procedure Laterality Date  . HERNIA REPAIR     umbilical hernia repair x 2.     OB History    Gravida  7   Para  6   Term  6   Preterm      AB      Living  6     SAB      TAB      Ectopic      Multiple  0   Live Births  6           Family History  Problem Relation Age of Onset  . Hypertension Other   . Diabetes Other   . Epilepsy Other   . Seizures Sister   . Hypertension Maternal Grandmother   . Kidney disease Maternal Grandmother     Social History   Tobacco Use  . Smoking status: Current Every Day  Smoker    Packs/day: 1.00    Types: Cigarettes  . Smokeless tobacco: Never Used  Substance Use Topics  . Alcohol use: Not Currently    Comment: occ. none since +pregnancy test.  . Drug use: Not Currently    Types: Cocaine    Home Medications Prior to Admission medications   Medication Sig Start Date End Date Taking? Authorizing Provider  albuterol (VENTOLIN HFA) 108 (90 Base) MCG/ACT inhaler Inhale 2 puffs into the lungs every 6 (six) hours as needed for wheezing or shortness of breath. 06/26/19   Shon Hale, MD  Blood Pressure Monitor MISC For regular home bp monitoring during pregnancy Patient not taking: Reported on 08/04/2019 01/23/19   Cheral Marker, CNM  clindamycin (CLEOCIN) 150 MG capsule Take 2 capsules (300 mg total) by mouth 3 (three) times daily. 09/13/19   Triplett, Tammy, PA-C  ferrous sulfate 325 (65 FE) MG tablet Take 1 tablet (325 mg total) by mouth 2 (two) times daily with a meal. Patient not taking: Reported on 08/04/2019 06/26/19   Shon Hale, MD  HYDROcodone-acetaminophen (NORCO/VICODIN) 5-325 MG tablet Take  one tab po q 4 hrs prn pain 09/13/19   Triplett, Tammy, PA-C  ibuprofen (ADVIL) 600 MG tablet Take 1 tablet (600 mg total) by mouth every 6 (six) hours as needed. 09/16/19   Isla Pence, MD  levETIRAcetam (KEPPRA) 500 MG tablet Take 1 tablet (500 mg total) by mouth 2 (two) times daily. Patient not taking: Reported on 08/04/2019 06/26/19   Roxan Hockey, MD  levETIRAcetam (KEPPRA) 500 MG tablet Take 1 tablet (500 mg total) by mouth 2 (two) times daily. 08/05/19   Maudie Flakes, MD  methocarbamol (ROBAXIN) 500 MG tablet Take 1 tablet (500 mg total) by mouth 3 (three) times daily. 09/13/19   Triplett, Tammy, PA-C  pantoprazole (PROTONIX) 40 MG tablet Take 1 tablet (40 mg total) by mouth daily. Patient not taking: Reported on 08/04/2019 06/26/19   Roxan Hockey, MD  predniSONE (STERAPRED UNI-PAK 21 TAB) 10 MG (21) TBPK tablet Take 6 tabs for 2 days,  then 5 for 2 days, then 4 for 2 days, then 3 for 2 days, 2 for 2 days, then 1 for 2 days 09/16/19   Isla Pence, MD  Prenatal Vit-Fe Fumarate-FA (PRENATAL MULTIVITAMIN) TABS tablet Take 1 tablet by mouth daily. Patient not taking: Reported on 08/04/2019 06/26/19   Roxan Hockey, MD    Allergies    Amoxicillin and Penicillins  Review of Systems   Review of Systems  Constitutional: Negative for activity change, appetite change and fever.  HENT: Positive for dental problem. Negative for rhinorrhea.   Respiratory: Negative for cough, chest tightness and shortness of breath.   Cardiovascular: Negative for chest pain.  Gastrointestinal: Negative for abdominal pain, nausea and vomiting.  Genitourinary: Negative for dysuria and hematuria.  Musculoskeletal: Negative for arthralgias and myalgias.  Neurological: Negative for dizziness, weakness and headaches.   all other systems are negative except as noted in the HPI and PMH.    Physical Exam Updated Vital Signs BP 115/69   Pulse 89   Temp 98.1 F (36.7 C)   Resp 16   Ht 5\' 11"  (1.803 m)   Wt 115.7 kg   LMP 09/25/2019   SpO2 100%   BMI 35.57 kg/m   Physical Exam Vitals and nursing note reviewed.  Constitutional:      General: She is not in acute distress.    Appearance: She is well-developed.  HENT:     Head: Normocephalic and atraumatic.     Mouth/Throat:     Pharynx: No oropharyngeal exudate.     Comments: Right lower molar is coming in.  There is no surrounding abscess or fluctuance.  Floor mouth is soft.  Controlling secretions.  No malocclusion or trismus  Eyes:     Conjunctiva/sclera: Conjunctivae normal.     Pupils: Pupils are equal, round, and reactive to light.  Neck:     Comments: No meningismus. Cardiovascular:     Rate and Rhythm: Normal rate and regular rhythm.     Heart sounds: Normal heart sounds. No murmur.  Pulmonary:     Effort: Pulmonary effort is normal. No respiratory distress.     Breath  sounds: Normal breath sounds.  Abdominal:     Palpations: Abdomen is soft.     Tenderness: There is no abdominal tenderness. There is no guarding or rebound.  Musculoskeletal:        General: No tenderness. Normal range of motion.     Cervical back: Normal range of motion and neck supple.  Skin:    General: Skin is  warm.  Neurological:     Mental Status: She is alert and oriented to person, place, and time.     Cranial Nerves: No cranial nerve deficit.     Motor: No abnormal muscle tone.     Coordination: Coordination normal.     Comments:  5/5 strength throughout. CN 2-12 intact.Equal grip strength.   Psychiatric:        Behavior: Behavior normal.     ED Results / Procedures / Treatments   Labs (all labs ordered are listed, but only abnormal results are displayed) Labs Reviewed - No data to display  EKG None  Radiology No results found.  Procedures Procedures (including critical care time)  Medications Ordered in ED Medications - No data to display  ED Course  I have reviewed the triage vital signs and the nursing notes.  Pertinent labs & imaging results that were available during my care of the patient were reviewed by me and considered in my medical decision making (see chart for details).    MDM Rules/Calculators/A&P                     Right lower dental pain for the past 2 days.  No fever.  No evidence of Ludwig's angina or drainable abscess.  We will treat with nonnarcotic pain medication, prophylactic antibiotics and follow-up with dentist. She is requesting tramadol which will not be provided given her seizure history.  She did receive a course of Vicodin last week for dental pain and will not receive anything further.  Return precautions discussed Final Clinical Impression(s) / ED Diagnoses Final diagnoses:  Pain, dental    Rx / DC Orders ED Discharge Orders    None       Brodee Mauritz, Jeannett Senior, MD 09/25/19 2140

## 2019-10-04 ENCOUNTER — Encounter (HOSPITAL_COMMUNITY): Payer: Self-pay | Admitting: Emergency Medicine

## 2019-10-04 ENCOUNTER — Emergency Department (HOSPITAL_COMMUNITY)
Admission: EM | Admit: 2019-10-04 | Discharge: 2019-10-04 | Disposition: A | Discharge status: Home / Self Care | Payer: Medicaid Other | Attending: Emergency Medicine | Admitting: Emergency Medicine

## 2019-10-04 ENCOUNTER — Other Ambulatory Visit: Payer: Self-pay

## 2019-10-04 DIAGNOSIS — F17210 Nicotine dependence, cigarettes, uncomplicated: Secondary | ICD-10-CM | POA: Insufficient documentation

## 2019-10-04 DIAGNOSIS — M545 Low back pain, unspecified: Secondary | ICD-10-CM

## 2019-10-04 DIAGNOSIS — Z79899 Other long term (current) drug therapy: Secondary | ICD-10-CM | POA: Insufficient documentation

## 2019-10-04 DIAGNOSIS — F1910 Other psychoactive substance abuse, uncomplicated: Secondary | ICD-10-CM | POA: Insufficient documentation

## 2019-10-04 LAB — RAPID URINE DRUG SCREEN, HOSP PERFORMED
Amphetamines: NOT DETECTED
Barbiturates: NOT DETECTED
Benzodiazepines: NOT DETECTED
Cocaine: POSITIVE — AB
Opiates: NOT DETECTED
Tetrahydrocannabinol: POSITIVE — AB

## 2019-10-04 LAB — PREGNANCY, URINE: Preg Test, Ur: NEGATIVE

## 2019-10-04 LAB — POC URINE PREG, ED: Preg Test, Ur: NEGATIVE

## 2019-10-04 MED ORDER — LIDOCAINE 5 % EX PTCH
1.0000 | MEDICATED_PATCH | CUTANEOUS | Status: DC
  Administered 2019-10-04: 1 via TRANSDERMAL
  Filled 2019-10-04: qty 1

## 2019-10-04 NOTE — ED Provider Notes (Signed)
[facility] Provider Note   CSN: [id number] Arrival date & time: 10/04/19  1346     History Chief Complaint  Patient presents with  . Back Pain    Chelsey Sullivan is a 34 y.o. female.  34 year old female presents with complaint of low back pain.  Patient is unable to say how long this has been going on for but denies any falls or injuries.  Patient states that she has taken "everything" for her pain without relief.  Patient denies abdominal pain, leg weakness, groin numbness, loss of bowel or bladder control.  History is difficult to obtain as patient constantly falls asleep throughout assessment.  And has a history of cocaine use, she denies drug or alcohol use today.        Past Medical History:  Diagnosis Date  . Cocaine abuse ([place])   . Depression   . Epilepsy [facility])     Patient Active Problem List   Diagnosis Date Noted  . Seizures ([place]) 06/25/2019  . Urinary tract infection 06/25/2019  . Cocaine use 06/25/2019  . Acute encephalopathy 06/25/2019  . Intractable seizures ([place]) 06/24/2019  . Epilepsy ([place])   . Depression   . Noncompliance w/medication treatment due to intermit use of medication   . Supervision of normal pregnancy 01/23/2019  . Late prenatal care in third trimester 01/23/2019  . [place] multiparity 01/23/2019  . Anemia, iron deficiency 11/04/2016  . Seizure disorder during pregnancy in third trimester ([place]) 11/03/2016    Past Surgical History:  Procedure Laterality Date  . HERNIA REPAIR     umbilical hernia repair x 2.     OB History    Gravida  7   Para  6   Term  6   Preterm      AB      Living  6     SAB      TAB      Ectopic      Multiple  0   Live Births  6           Family History  Problem Relation Age of Onset  . Hypertension Other   . Diabetes Other   . Epilepsy Other   . Seizures Sister   . Hypertension Maternal Grandmother   . Kidney disease Maternal Grandmother     Social History    Tobacco Use  . Smoking status: Current Every Day Smoker    Packs/day: 1.00    Types: Cigarettes  . Smokeless tobacco: Never Used  Substance Use Topics  . Alcohol use: Not Currently    Comment: occ. none since +pregnancy test.  . Drug use: Not Currently    Types: Cocaine    Home Medications Prior to Admission medications   Medication Sig Start Date End Date Taking? Authorizing Provider  levETIRAcetam (KEPPRA) 500 MG tablet Take 1 tablet (500 mg total) by mouth 2 (two) times daily. 08/05/19  Yes Maudie Flakes, MD  albuterol (VENTOLIN HFA) 108 (90 Base) MCG/ACT inhaler Inhale 2 puffs into the lungs every 6 (six) hours as needed for wheezing or shortness of breath. 06/26/19   Roxan Hockey, MD  Blood Pressure Monitor MISC For regular home bp monitoring during pregnancy Patient not taking: Reported on 08/04/2019 01/23/19   Roma Schanz, CNM  clindamycin (CLEOCIN) 300 MG capsule Take 1 capsule (300 mg total) by mouth 3 (three) times daily. Patient not taking: Reported on 10/04/2019 09/25/19   Ezequiel Essex, MD  ferrous sulfate 325 (65  FE) MG tablet Take 1 tablet (325 mg total) by mouth 2 (two) times daily with a meal. Patient not taking: Reported on 08/04/2019 06/26/19   Shon Hale, MD  HYDROcodone-acetaminophen (NORCO/VICODIN) 5-325 MG tablet Take one tab po q 4 hrs prn pain Patient not taking: Reported on 10/04/2019 09/13/19   Triplett, Tammy, PA-C  ibuprofen (ADVIL) 600 MG tablet Take 1 tablet (600 mg total) by mouth every 6 (six) hours as needed. Patient not taking: Reported on 10/04/2019 09/16/19   Jacalyn Lefevre, MD  levETIRAcetam (KEPPRA) 500 MG tablet Take 1 tablet (500 mg total) by mouth 2 (two) times daily. Patient not taking: Reported on 08/04/2019 06/26/19   Shon Hale, MD  methocarbamol (ROBAXIN) 500 MG tablet Take 1 tablet (500 mg total) by mouth 3 (three) times daily. Patient not taking: Reported on 10/04/2019 09/13/19   Triplett, Tammy, PA-C  naproxen  (NAPROSYN) 500 MG tablet Take 1 tablet (500 mg total) by mouth 2 (two) times daily as needed. Patient not taking: Reported on 10/04/2019 09/25/19   Glynn Octave, MD  pantoprazole (PROTONIX) 40 MG tablet Take 1 tablet (40 mg total) by mouth daily. Patient not taking: Reported on 08/04/2019 06/26/19   Shon Hale, MD  predniSONE (STERAPRED UNI-PAK 21 TAB) 10 MG (21) TBPK tablet Take 6 tabs for 2 days, then 5 for 2 days, then 4 for 2 days, then 3 for 2 days, 2 for 2 days, then 1 for 2 days Patient not taking: Reported on 10/04/2019 09/16/19   Jacalyn Lefevre, MD  Prenatal Vit-Fe Fumarate-FA (PRENATAL MULTIVITAMIN) TABS tablet Take 1 tablet by mouth daily. Patient not taking: Reported on 08/04/2019 06/26/19   Shon Hale, MD    Allergies    Amoxicillin and Penicillins  Review of Systems   Review of Systems  Constitutional: Negative for fever.  Gastrointestinal: Negative for abdominal pain, constipation, diarrhea, nausea and vomiting.  Genitourinary: Negative for dysuria and frequency.  Musculoskeletal: Positive for back pain.  Skin: Negative for rash and wound.  Allergic/Immunologic: Negative for immunocompromised state.  Neurological: Negative for weakness and numbness.  All other systems reviewed and are negative.   Physical Exam Updated Vital Signs BP (!) 110/58 (BP Location: Left Arm)   Pulse 77   Temp 98.6 F (37 C) (Oral)   Resp 16   Ht 5\' 11"  (1.803 m)   Wt 97.5 kg   LMP 09/25/2019   SpO2 99%   BMI 29.99 kg/m   Physical Exam Vitals and nursing note reviewed.  Constitutional:      General: She is not in acute distress.    Appearance: She is well-developed. She is not diaphoretic.     Comments: Sleeping, arouses to verbal stimuli, falls back asleep easily  HENT:     Head: Normocephalic and atraumatic.  Cardiovascular:     Rate and Rhythm: Normal rate and regular rhythm.     Pulses: Normal pulses.     Heart sounds: Normal heart sounds.  Pulmonary:      Effort: Pulmonary effort is normal.     Breath sounds: Normal breath sounds.  Abdominal:     Palpations: Abdomen is soft.     Tenderness: There is no abdominal tenderness.  Musculoskeletal:        General: Tenderness present.     Lumbar back: Tenderness present. No bony tenderness.     Right lower leg: No edema.     Left lower leg: No edema.     Comments: General low back tenderness, no step  offs, no body tenderness   Skin:    General: Skin is warm and dry.     Findings: No erythema or rash.     ED Results / Procedures / Treatments   Labs (all labs ordered are listed, but only abnormal results are displayed) Labs Reviewed  RAPID URINE DRUG SCREEN, HOSP PERFORMED - Abnormal; Notable for the following components:      Result Value   Cocaine POSITIVE (*)    Tetrahydrocannabinol POSITIVE (*)    All other components within normal limits  PREGNANCY, URINE  POC URINE PREG, ED    EKG None  Radiology No results found.  Procedures Procedures (including critical care time)  Medications Ordered in ED Medications  lidocaine (LIDODERM) 5 % 1 patch (1 patch Transdermal Patch Applied 10/04/19 1703)    ED Course  I have reviewed the triage vital signs and the nursing notes.  Pertinent labs & imaging results that were available during my care of the patient were reviewed by me and considered in my medical decision making (see chart for details).  Clinical Course as of Oct 03 1809  Wed Oct 04, 2019  1809 33yo female presents with complaint of low back pain without injury, abdominal pain, groin numbness or leg weakness. Patient is sleeping, rouses to verbal stimuli and falls asleep mid sentence. Patient denies drug or alcohol use, is here with her boyfriend who is in a similar state. Patient states she has not slept lately and is very tired. Patient was observed in the ER until she was able to ambulate safely, was given a lidoderm patch for her back pain. UDS is positive for marijuana  and cocaine, she is not pregnant. Patient may be discharged.    [LM]    Clinical Course User Index [LM] Alden Hipp   MDM Rules/Calculators/A&P                      Final Clinical Impression(s) / ED Diagnoses Final diagnoses:  Acute bilateral low back pain without sciatica  Polysubstance abuse [facility])    Rx / DC Orders ED Discharge Orders    None       Jeannie Fend, PA-C 10/04/19 1811    Sabas Sous, MD 10/05/19 [phone]

## 2019-10-04 NOTE — ED Triage Notes (Signed)
Patient reports back pain from her neck to the bottom of her back. Had to awaken patient for Triage. Patient states her back has "been hurting for a minute but has become worse."

## 2019-10-15 ENCOUNTER — Encounter (HOSPITAL_COMMUNITY): Payer: Self-pay | Admitting: Emergency Medicine

## 2019-10-15 ENCOUNTER — Other Ambulatory Visit: Payer: Self-pay

## 2019-10-15 ENCOUNTER — Emergency Department (HOSPITAL_COMMUNITY): Payer: Medicaid Other

## 2019-10-15 ENCOUNTER — Emergency Department (HOSPITAL_COMMUNITY)
Admission: EM | Admit: 2019-10-15 | Discharge: 2019-10-15 | Disposition: A | Discharge status: Home / Self Care | Payer: Medicaid Other | Attending: Emergency Medicine | Admitting: Emergency Medicine

## 2019-10-15 DIAGNOSIS — M546 Pain in thoracic spine: Secondary | ICD-10-CM | POA: Diagnosis not present

## 2019-10-15 DIAGNOSIS — M542 Cervicalgia: Secondary | ICD-10-CM

## 2019-10-15 DIAGNOSIS — F17210 Nicotine dependence, cigarettes, uncomplicated: Secondary | ICD-10-CM | POA: Insufficient documentation

## 2019-10-15 DIAGNOSIS — M545 Low back pain, unspecified: Secondary | ICD-10-CM

## 2019-10-15 DIAGNOSIS — Z79899 Other long term (current) drug therapy: Secondary | ICD-10-CM | POA: Diagnosis not present

## 2019-10-15 DIAGNOSIS — M549 Dorsalgia, unspecified: Secondary | ICD-10-CM

## 2019-10-15 DIAGNOSIS — G8929 Other chronic pain: Secondary | ICD-10-CM

## 2019-10-15 LAB — I-STAT BETA HCG BLOOD, ED (MC, WL, AP ONLY): I-stat hCG, quantitative: <5 m[IU]/mL (ref <5)

## 2019-10-15 LAB — PREGNANCY, URINE: Preg Test, Ur: NEGATIVE

## 2019-10-15 MED ORDER — METHOCARBAMOL 500 MG PO TABS: 500.0000 mg | ORAL_TABLET | Freq: Two times a day (BID) | ORAL | 0 refills | Status: DC

## 2019-10-15 MED ORDER — NAPROXEN 250 MG PO TABS
500.0000 mg | ORAL_TABLET | Freq: Once | ORAL | Status: AC
  Administered 2019-10-15: 500 mg via ORAL
  Filled 2019-10-15: qty 2

## 2019-10-15 MED ORDER — METHOCARBAMOL 500 MG PO TABS
1000.0000 mg | ORAL_TABLET | Freq: Once | ORAL | Status: AC
  Administered 2019-10-15: 22:00:00 1000 mg via ORAL
  Filled 2019-10-15: qty 2

## 2019-10-15 MED ORDER — LIDOCAINE 5 % EX PTCH
3.0000 | MEDICATED_PATCH | CUTANEOUS | Status: DC
  Administered 2019-10-15: 22:00:00 3 via TRANSDERMAL
  Filled 2019-10-15: qty 3

## 2019-10-15 NOTE — ED Triage Notes (Signed)
Patient has a history of back pain but states that her back pain has became worse over the last month due to a past injury. Patient states pain starts in her neck and goes down to her legs and feet. Patient also states numbness at times in her legs.

## 2019-10-15 NOTE — ED Notes (Signed)
Urine spec to lab 
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
## 2019-10-15 NOTE — Discharge Instructions (Addendum)
Continue Ibuprofen as needed for pain. Take this medicine with food. Take muscle relaxer at bedtime to help you sleep. This medicine makes you drowsy so do not take before driving or work Use a heating pad for sore muscles - use for 20 minutes several times a day Try gentle range of motion exercises Please follow up with a sports medicine doctor for your ongoing back and neck pain Return for worsening symptoms
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
## 2019-10-15 NOTE — ED Notes (Signed)
Pt cannot/will not urinate   She reports she is  Not pregnant  Desires to go for rad
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
## 2019-10-15 NOTE — ED Notes (Signed)
To Rad 

## 2019-10-24 ENCOUNTER — Emergency Department (HOSPITAL_COMMUNITY): Payer: Medicaid Other

## 2019-10-24 ENCOUNTER — Emergency Department (HOSPITAL_COMMUNITY)
Admission: EM | Admit: 2019-10-24 | Discharge: 2019-10-24 | Disposition: A | Discharge status: Home / Self Care | Payer: Medicaid Other | Attending: Emergency Medicine | Admitting: Emergency Medicine

## 2019-10-24 ENCOUNTER — Encounter (HOSPITAL_COMMUNITY): Payer: Self-pay | Admitting: Emergency Medicine

## 2019-10-24 ENCOUNTER — Other Ambulatory Visit: Payer: Self-pay

## 2019-10-24 DIAGNOSIS — Y939 Activity, unspecified: Secondary | ICD-10-CM | POA: Insufficient documentation

## 2019-10-24 DIAGNOSIS — W010XXA Fall on same level from slipping, tripping and stumbling without subsequent striking against object, initial encounter: Secondary | ICD-10-CM | POA: Diagnosis not present

## 2019-10-24 DIAGNOSIS — F17210 Nicotine dependence, cigarettes, uncomplicated: Secondary | ICD-10-CM | POA: Insufficient documentation

## 2019-10-24 DIAGNOSIS — Y92811 Bus as the place of occurrence of the external cause: Secondary | ICD-10-CM | POA: Diagnosis not present

## 2019-10-24 DIAGNOSIS — S6991XA Unspecified injury of right wrist, hand and finger(s), initial encounter: Secondary | ICD-10-CM | POA: Diagnosis present

## 2019-10-24 DIAGNOSIS — Y999 Unspecified external cause status: Secondary | ICD-10-CM | POA: Insufficient documentation

## 2019-10-24 DIAGNOSIS — M545 Low back pain, unspecified: Secondary | ICD-10-CM

## 2019-10-24 DIAGNOSIS — S60221A Contusion of right hand, initial encounter: Secondary | ICD-10-CM | POA: Insufficient documentation

## 2019-10-24 LAB — I-STAT BETA HCG BLOOD, ED (MC, WL, AP ONLY): I-stat hCG, quantitative: <5 m[IU]/mL (ref <5)

## 2019-10-24 NOTE — ED Provider Notes (Signed)
[facility] EMERGENCY DEPARTMENT Provider Note   CSN: [id number] Arrival date & time: 10/24/19  1444     History Chief Complaint  Patient presents with  . Fall    Chelsey Sullivan is a 34 y.o. female.  HPI She presents for injuries from fall, when she "tripped."  She came here for evaluation, on a bus, with her boyfriend Chelsey Sullivan.  He is also being evaluated for a painful condition.  They typically come to the ED together.  She complains of pain in her right hand and her low back.  These injuries occurred in the fall today.  There are no other known modifying factors.    Past Medical History:  Diagnosis Date  . Cocaine abuse (HCC)   . Depression   . Epilepsy [facility])     Patient Active Problem List   Diagnosis Date Noted  . Seizures (HCC) 06/25/2019  . Urinary tract infection 06/25/2019  . Cocaine use 06/25/2019  . Acute encephalopathy 06/25/2019  . Intractable seizures (HCC) 06/24/2019  . Epilepsy (HCC)   . Depression   . Noncompliance w/medication treatment due to intermit use of medication   . Supervision of normal pregnancy 01/23/2019  . Late prenatal care in third trimester 01/23/2019  . Grand multiparity 01/23/2019  . Anemia, iron deficiency 11/04/2016  . Seizure disorder during pregnancy in third trimester (HCC) 11/03/2016    Past Surgical History:  Procedure Laterality Date  . HERNIA REPAIR     umbilical hernia repair x 2.     OB History    Gravida  7   Para  6   Term  6   Preterm      AB      Living  6     SAB      TAB      Ectopic      Multiple  0   Live Births  6           Family History  Problem Relation Age of Onset  . Hypertension Other   . Diabetes Other   . Epilepsy Other   . Seizures Sister   . Hypertension Maternal Grandmother   . Kidney disease Maternal Grandmother     Social History   Tobacco Use  . Smoking status: Current Every Day Smoker    Packs/day: 1.00    Types: Cigarettes  . Smokeless tobacco: Never Used   Substance Use Topics  . Alcohol use: Not Currently    Comment: occ. none since +pregnancy test.  . Drug use: Not Currently    Types: Cocaine    Home Medications Prior to Admission medications   Medication Sig Start Date End Date Taking? Authorizing Provider  albuterol (VENTOLIN HFA) 108 (90 Base) MCG/ACT inhaler Inhale 2 puffs into the lungs every 6 (six) hours as needed for wheezing or shortness of breath. 06/26/19   Shon Hale, MD  levETIRAcetam (KEPPRA) 500 MG tablet Take 1 tablet (500 mg total) by mouth 2 (two) times daily. Patient not taking: Reported on 08/04/2019 06/26/19   Shon Hale, MD  levETIRAcetam (KEPPRA) 500 MG tablet Take 1 tablet (500 mg total) by mouth 2 (two) times daily. 08/05/19   Sabas Sous, MD  methocarbamol (ROBAXIN) 500 MG tablet Take 1 tablet (500 mg total) by mouth 2 (two) times daily. 10/15/19   Bethel Born, PA-C  predniSONE (STERAPRED UNI-PAK 21 TAB) 10 MG (21) TBPK tablet Take 6 tabs for 2 days, then 5 for 2 days, then 4 for  2 days, then 3 for 2 days, 2 for 2 days, then 1 for 2 days Patient not taking: Reported on 10/04/2019 09/16/19   Jacalyn Lefevre, MD  ferrous sulfate 325 (65 FE) MG tablet Take 1 tablet (325 mg total) by mouth 2 (two) times daily with a meal. Patient not taking: Reported on 08/04/2019 06/26/19 10/15/19  Shon Hale, MD  pantoprazole (PROTONIX) 40 MG tablet Take 1 tablet (40 mg total) by mouth daily. Patient not taking: Reported on 08/04/2019 06/26/19 10/15/19  Shon Hale, MD    Allergies    Amoxicillin and Penicillins  Review of Systems   Review of Systems  All other systems reviewed and are negative.   Physical Exam Updated Vital Signs BP 102/66 (BP Location: Left Arm)   Pulse 76   Temp 98.4 F (36.9 C) (Oral)   Resp 17   Ht 5\' 11"  (1.803 m)   Wt 115.7 kg   LMP 10/24/2019   SpO2 99%   BMI 35.57 kg/m   Physical Exam Vitals and nursing note reviewed.  Constitutional:      General: She is not  in acute distress.    Appearance: She is well-developed. She is not ill-appearing, toxic-appearing or diaphoretic.  HENT:     Head: Normocephalic and atraumatic.  Eyes:     Conjunctiva/sclera: Conjunctivae normal.     Pupils: Pupils are equal, round, and reactive to light.  Neck:     Trachea: Phonation normal.  Cardiovascular:     Rate and Rhythm: Normal rate.  Pulmonary:     Effort: Pulmonary effort is normal.  Abdominal:     General: There is no distension.     Palpations: Abdomen is soft.  Musculoskeletal:     Cervical back: Normal range of motion and neck supple.     Comments: She guards against movement and with palpation in the right hand, over the second third metacarpals.  No other swelling or deformity of the right hand, fingers or wrist.  Nontender right elbow and right shoulder.  Mild diffuse tenderness of the lumbar region with negative straight leg raising bilaterally.  Skin:    General: Skin is warm and dry.  Neurological:     Mental Status: She is alert and oriented to person, place, and time.     Motor: No abnormal muscle tone.  Psychiatric:        Mood and Affect: Mood normal.        Behavior: Behavior normal.        Thought Content: Thought content normal.        Judgment: Judgment normal.     ED Results / Procedures / Treatments   Labs (all labs ordered are listed, but only abnormal results are displayed) Labs Reviewed  I-STAT BETA HCG BLOOD, ED ([facility], [facility], [facility] ONLY)    EKG None  Radiology DG Lumbar Spine Complete  Result Date: 10/24/2019 CLINICAL DATA:  Lower back pain after fall. EXAM: LUMBAR SPINE - COMPLETE 4+ VIEW COMPARISON:  October 15, 2019. FINDINGS: There is no evidence of lumbar spine fracture. Alignment is normal. Intervertebral disc spaces are maintained. IMPRESSION: Negative. Electronically Signed   By: October 17, 2019 M.D.   On: 10/24/2019 16:47   DG Hand Complete Right  Result Date: 10/24/2019 CLINICAL DATA:  Right hand pain after fall.  EXAM: RIGHT HAND - COMPLETE 3+ VIEW COMPARISON:  None. FINDINGS: There is no evidence of fracture or dislocation. There is no evidence of arthropathy or other focal bone abnormality. Soft  tissues are unremarkable. IMPRESSION: Negative. Electronically Signed   By: Marijo Conception M.D.   On: 10/24/2019 16:48    Procedures Procedures (including critical care time)  Medications Ordered in ED Medications - No data to display  ED Course  I have reviewed the triage vital signs and the nursing notes.  Pertinent labs & imaging results that were available during my care of the patient were reviewed by me and considered in my medical decision making (see chart for details).  Clinical Course as of Oct 23 1713  Tue Oct 24, 2019  1715 No fracture or dislocation, interpreted by me  DG Hand Complete Right [EW]  1715 No fracture, DJD, or dislocation, interpreted by me  DG Lumbar Spine Complete [EW]    Clinical Course User Index [EW] Daleen Bo, MD   MDM Rules/Calculators/A&P                       Patient Vitals for the past 24 hrs:  BP Temp Temp src Pulse Resp SpO2 Height Weight  10/24/19 1455 102/66 98.4 F (36.9 C) Oral 76 17 99 % - -  10/24/19 1453 - - - - - - 5\' 11"  (1.803 m) 115.7 kg    5:13 PM Reevaluation with update and discussion. After initial assessment and treatment, an updated evaluation reveals no change in clinical status.  Findings discussed with the patient, all questions answered.  She is currently in the room with her boyfriend Chelsey Sullivan who is also here for painful condition today. Daleen Bo   Medical Decision Making: Fall, suspect to be mechanical.  Contusion right hand is evident clinically, no fractures on imaging.  No indication for further ED evaluation at this time.  Elenna Spratling was evaluated in Emergency Department on 10/24/2019 for the symptoms described in the history of present illness. She was evaluated in the context of the global COVID-19 pandemic, which  necessitated consideration that the patient might be at risk for infection with the SARS-CoV-2 virus that causes COVID-19. Institutional protocols and algorithms that pertain to the evaluation of patients at risk for COVID-19 are in a state of rapid change based on information released by regulatory bodies including the CDC and federal and state organizations. These policies and algorithms were followed during the patient's care in the ED.  CRITICAL CARE-no Performed by: Daleen Bo   Nursing Notes Reviewed/ Care Coordinated Applicable Imaging Reviewed Interpretation of Laboratory Data incorporated into ED treatment  The patient appears reasonably screened and/or stabilized for discharge and I doubt any other medical condition or other [facility] requiring further screening, evaluation, or treatment in the ED at this time prior to discharge.  Plan: Home Medications-OTC analgesia of choice; Home Treatments-cryotherapy, heat therapy, Ace wrap; return here if the recommended treatment, does not improve the symptoms; Recommended follow up-PCP, as needed    Final Clinical Impression(s) / ED Diagnoses Final diagnoses:  Contusion of right hand, initial encounter  Acute low back pain without sciatica, unspecified back pain laterality    Rx / DC Orders ED Discharge Orders    None       Daleen Bo, MD 10/24/19 1715

## 2019-10-24 NOTE — ED Triage Notes (Signed)
Tripped and fell, pain and swelling to RT hand and lower back pain

## 2019-10-24 NOTE — Discharge Instructions (Addendum)
There were no serious injuries found on the x-ray images today.  For pain, use ice on sore areas 3-4 times a day for 2 to 3 days, after that use heat.  For pain use ibuprofen 400 mg 3 times a day, or acetaminophen 650 mg every 4 hours.  If you are still having pain after a week or so check with your doctor.

## 2019-10-31 ENCOUNTER — Emergency Department (HOSPITAL_COMMUNITY)
Admission: EM | Admit: 2019-10-31 | Discharge: 2019-10-31 | Disposition: A | Discharge status: Home / Self Care | Payer: Medicaid Other | Attending: Emergency Medicine | Admitting: Emergency Medicine

## 2019-10-31 ENCOUNTER — Encounter (HOSPITAL_COMMUNITY): Payer: Self-pay

## 2019-10-31 ENCOUNTER — Other Ambulatory Visit: Payer: Self-pay

## 2019-10-31 DIAGNOSIS — G40909 Epilepsy, unspecified, not intractable, without status epilepticus: Secondary | ICD-10-CM | POA: Insufficient documentation

## 2019-10-31 DIAGNOSIS — F17210 Nicotine dependence, cigarettes, uncomplicated: Secondary | ICD-10-CM | POA: Diagnosis not present

## 2019-10-31 DIAGNOSIS — G8929 Other chronic pain: Secondary | ICD-10-CM

## 2019-10-31 DIAGNOSIS — M5440 Lumbago with sciatica, unspecified side: Secondary | ICD-10-CM | POA: Diagnosis not present

## 2019-10-31 DIAGNOSIS — M545 Low back pain: Secondary | ICD-10-CM | POA: Diagnosis present

## 2019-10-31 MED ORDER — KETOROLAC TROMETHAMINE 60 MG/2ML IM SOLN
60.0000 mg | Freq: Once | INTRAMUSCULAR | Status: AC
  Administered 2019-10-31: 60 mg via INTRAMUSCULAR
  Filled 2019-10-31: qty 2

## 2019-10-31 MED ORDER — METHOCARBAMOL 500 MG PO TABS: 500.0000 mg | ORAL_TABLET | Freq: Two times a day (BID) | ORAL | 0 refills | Status: DC

## 2019-10-31 MED ORDER — PREDNISONE 10 MG PO TABS: 40.0000 mg | ORAL_TABLET | Freq: Every day | ORAL | 0 refills | Status: AC

## 2019-10-31 NOTE — ED Provider Notes (Signed)
[facility] EMERGENCY DEPARTMENT Provider Note   CSN: [id number] Arrival date & time: 10/31/19  1247     History Chief Complaint  Patient presents with  . Back Pain    Chelsey Sullivan is a 34 y.o. female.  Patient is a 34 year old female with history of cocaine abuse, epilepsy, depression, chronic back pain presenting to the emergency department for back pain radiating into her legs.  Patient reports that this is her chronic usual back pain.  Patient frequently comes to the emergency department for the same.  She has not seen her primary care doctor or followed up with specialist for this.  Reports pain starts in the lower back and radiates in her bilateral legs.  Denies any weakness, fever, numbness, saddle anesthesia, loss of control of bowel or bladder.  She has tried over-the-counter ibuprofen with no relief.        Past Medical History:  Diagnosis Date  . Cocaine abuse (HCC)   . Depression   . Epilepsy [facility])     Patient Active Problem List   Diagnosis Date Noted  . Seizures (HCC) 06/25/2019  . Urinary tract infection 06/25/2019  . Cocaine use 06/25/2019  . Acute encephalopathy 06/25/2019  . Intractable seizures (HCC) 06/24/2019  . Epilepsy (HCC)   . Depression   . Noncompliance w/medication treatment due to intermit use of medication   . Supervision of normal pregnancy 01/23/2019  . Late prenatal care in third trimester 01/23/2019  . Grand multiparity 01/23/2019  . Anemia, iron deficiency 11/04/2016  . Seizure disorder during pregnancy in third trimester (HCC) 11/03/2016    Past Surgical History:  Procedure Laterality Date  . HERNIA REPAIR     umbilical hernia repair x 2.     OB History    Gravida  7   Para  6   Term  6   Preterm      AB      Living  6     SAB      TAB      Ectopic      Multiple  0   Live Births  6           Family History  Problem Relation Age of Onset  . Hypertension Other   . Diabetes Other   . Epilepsy Other     . Seizures Sister   . Hypertension Maternal Grandmother   . Kidney disease Maternal Grandmother     Social History   Tobacco Use  . Smoking status: Current Every Day Smoker    Packs/day: 1.00    Types: Cigarettes  . Smokeless tobacco: Never Used  Substance Use Topics  . Alcohol use: Not Currently  . Drug use: Not Currently    Types: Cocaine    Home Medications Prior to Admission medications   Medication Sig Start Date End Date Taking? Authorizing Provider  albuterol (VENTOLIN HFA) 108 (90 Base) MCG/ACT inhaler Inhale 2 puffs into the lungs every 6 (six) hours as needed for wheezing or shortness of breath. 06/26/19   Shon Hale, MD  levETIRAcetam (KEPPRA) 500 MG tablet Take 1 tablet (500 mg total) by mouth 2 (two) times daily. Patient not taking: Reported on 08/04/2019 06/26/19   Shon Hale, MD  levETIRAcetam (KEPPRA) 500 MG tablet Take 1 tablet (500 mg total) by mouth 2 (two) times daily. 08/05/19   Sabas Sous, MD  methocarbamol (ROBAXIN) 500 MG tablet Take 1 tablet (500 mg total) by mouth 2 (two) times daily. 10/31/19  Ronnie Doss A, PA-C  predniSONE (DELTASONE) 10 MG tablet Take 4 tablets (40 mg total) by mouth daily for 5 days. 10/31/19 11/05/19  Arlyn Dunning, PA-C  ferrous sulfate 325 (65 FE) MG tablet Take 1 tablet (325 mg total) by mouth 2 (two) times daily with a meal. Patient not taking: Reported on 08/04/2019 06/26/19 10/15/19  Shon Hale, MD  pantoprazole (PROTONIX) 40 MG tablet Take 1 tablet (40 mg total) by mouth daily. Patient not taking: Reported on 08/04/2019 06/26/19 10/15/19  Shon Hale, MD    Allergies    Amoxicillin and Penicillins  Review of Systems   Review of Systems  Constitutional: Negative for chills and fever.  Respiratory: Negative for cough and shortness of breath.   Cardiovascular: Negative for chest pain.  Gastrointestinal: Negative for abdominal pain, nausea and vomiting.  Genitourinary: Negative for difficulty  urinating and dysuria.  Musculoskeletal: Positive for arthralgias, back pain, myalgias and neck pain.  Skin: Negative for rash and wound.  Neurological: Negative for dizziness and numbness.    Physical Exam Updated Vital Signs BP (!) 99/53   Pulse 61   Temp 97.8 F (36.6 C)   Resp 18   Ht 5\' 11"  (1.803 m)   Wt 115.7 kg   LMP 10/23/2019   SpO2 100%   BMI 35.57 kg/m   Physical Exam Vitals and nursing note reviewed.  Constitutional:      General: She is not in acute distress.    Appearance: Normal appearance. She is obese. She is not ill-appearing, toxic-appearing or diaphoretic.  HENT:     Head: Normocephalic.  Eyes:     Conjunctiva/sclera: Conjunctivae normal.  Pulmonary:     Effort: Pulmonary effort is normal.  Musculoskeletal:     Comments: Bilateral lower extremity normal strength and sensation.  Tenderness to palpation in the bilateral paraspinal muscles with no bony tenderness.  Skin:    General: Skin is dry.  Neurological:     Mental Status: She is alert.     Sensory: No sensory deficit.     Motor: No weakness.     Gait: Gait normal.     Deep Tendon Reflexes: Reflexes normal.  Psychiatric:        Mood and Affect: Mood normal.     ED Results / Procedures / Treatments   Labs (all labs ordered are listed, but only abnormal results are displayed) Labs Reviewed - No data to display  EKG None  Radiology No results found.  Procedures Procedures (including critical care time)  Medications Ordered in ED Medications  ketorolac (TORADOL) injection 60 mg (has no administration in time range)    ED Course  I have reviewed the triage vital signs and the nursing notes.  Pertinent labs & imaging results that were available during my care of the patient were reviewed by me and considered in my medical decision making (see chart for details).  Clinical Course as of Oct 31 1503  Tue Oct 31, 2019  1503 Patient was evaluated for back pain today. Patient has no  concerning symptoms or physical exam findings including no fever, no loss of control of bowel or bladder, no urinary retention, no saddle anesthesia, no leg weakness. She was given medication to treat her symptoms and advised to f/u with PMD for further workup including possible PT, medication change, further imaging, etc. She was advised on all concerning symptoms above and to return to the ED if any of them arise.       [KM]  Clinical Course User Index [KM] Kristine Royal   MDM Rules/Calculators/A&P                      Based on review of vitals, medical screening exam, lab work and/or imaging, there does not appear to be an acute, emergent etiology for the patient's symptoms. Counseled pt on good return precautions and encouraged both PCP and ED follow-up as needed.  Prior to discharge, I also discussed incidental imaging findings with patient in detail and advised appropriate, recommended follow-up in detail.  Clinical Impression: 1. Chronic bilateral low back pain with sciatica, sciatica laterality unspecified     Disposition: Discharge  Prior to providing a prescription for a controlled substance, I independently reviewed the patient's recent prescription history on the [facility]. The patient had no recent or regular prescriptions and was deemed appropriate for a brief, less than 3 day prescription of narcotic for acute analgesia.  This note was prepared with assistance of Systems analyst. Occasional wrong-word or sound-a-like substitutions may have occurred due to the inherent limitations of voice recognition software.  Final Clinical Impression(s) / ED Diagnoses Final diagnoses:  Chronic bilateral low back pain with sciatica, sciatica laterality unspecified    Rx / DC Orders ED Discharge Orders         Ordered    methocarbamol (ROBAXIN) 500 MG tablet  2 times daily     10/31/19 1505    predniSONE  (DELTASONE) 10 MG tablet  Daily     10/31/19 1505           Kristine Royal 10/31/19 1505    Milton Ferguson, MD 11/03/19 1514

## 2019-10-31 NOTE — ED Triage Notes (Signed)
Pt reports pain in lower back radiating down both legs.

## 2020-12-16 ENCOUNTER — Other Ambulatory Visit: Payer: Medicaid Other

## 2021-10-17 IMAGING — CT CT HEAD W/O CM
3 series · 16 of 47 positions shown, 19 images · non-contrast
Comparison: 06/24/2019

CLINICAL DATA: Pt had seizure witnessed by family today and then
seizure approx 30 sec witnessed by EMS full body tremor. Pt confused
and belligerent at this time. Boyfriend says she has not taken
keppra for over 3 years

EXAM:
CT HEAD WITHOUT CONTRAST
TECHNIQUE: Contiguous axial images were obtained from the base of the skull
through the vertex without intravenous contrast.

[Series 3: head w o · axial · 0.43mm/px · z∈[-24,+101]mm · 10 of 30 slices shown, 13 images]
[im 3/30  brain]
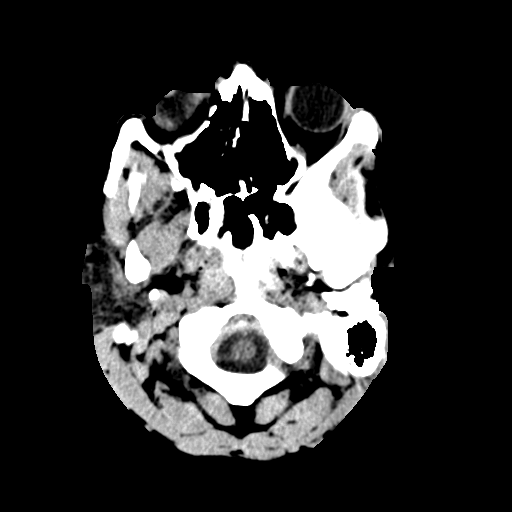
[im 3/30  bone]
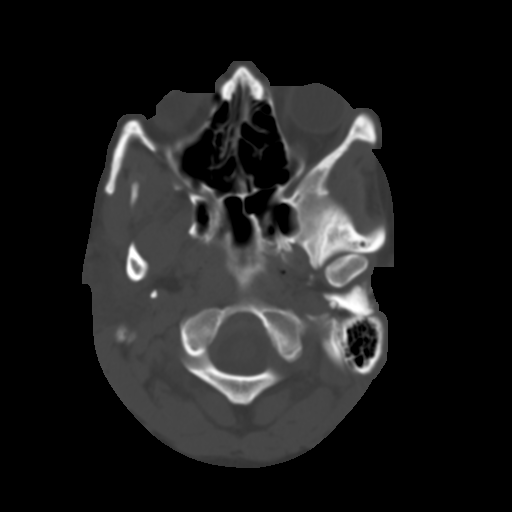
[im 6/30  brain]
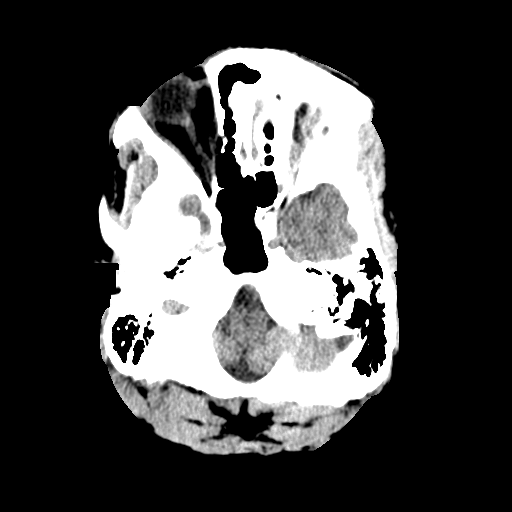
[im 9/30  brain]
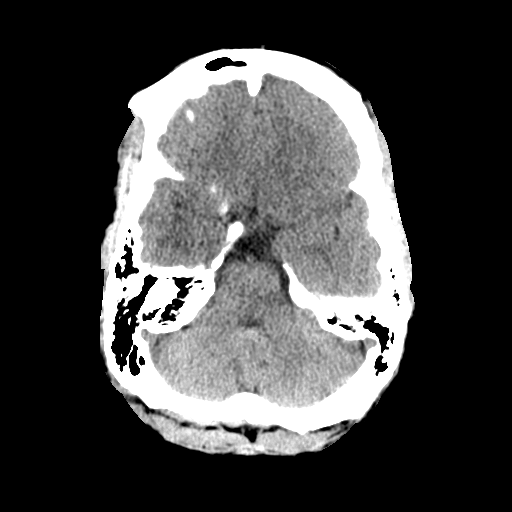
[im 11/30  brain]
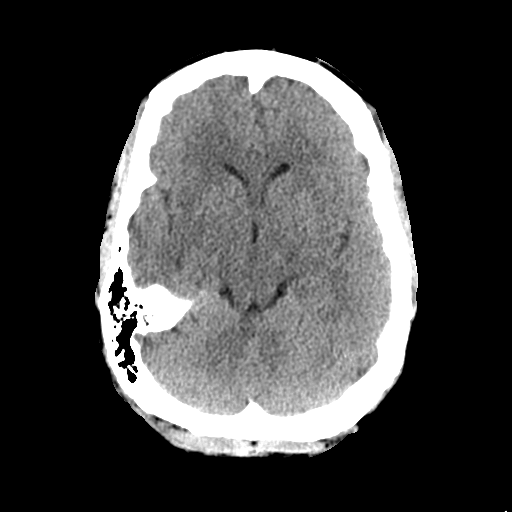
[im 14/30  brain]
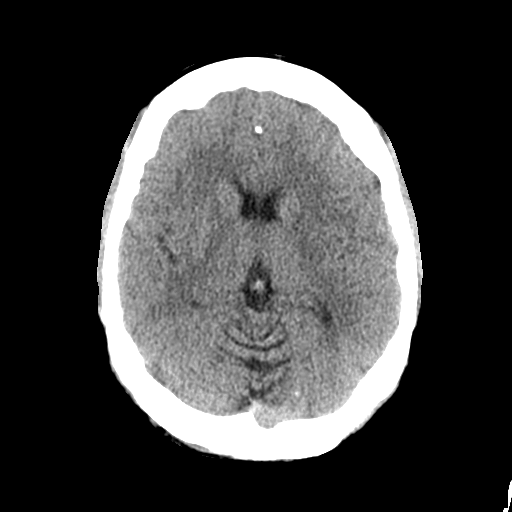
[im 14/30  bone]
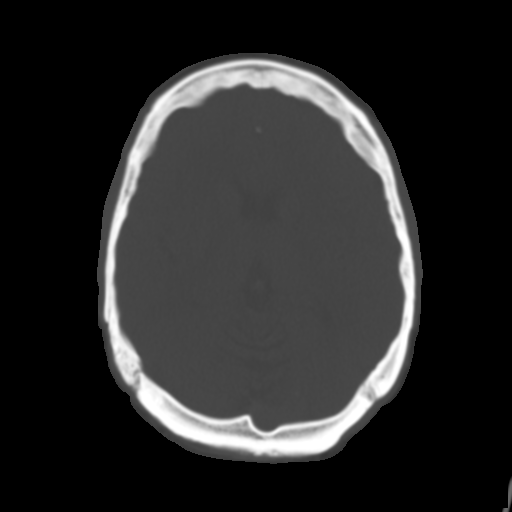
[im 17/30  brain]
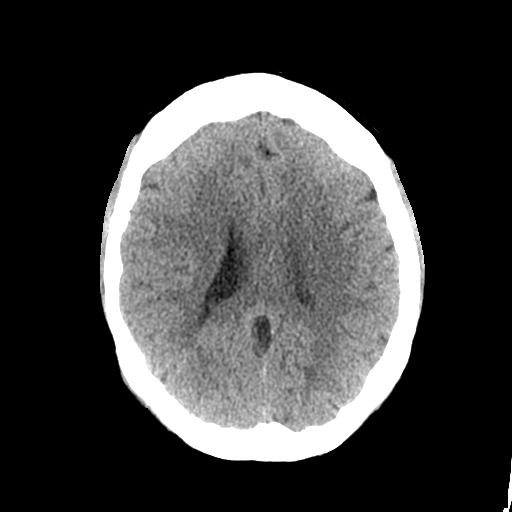
[im 20/30  brain]
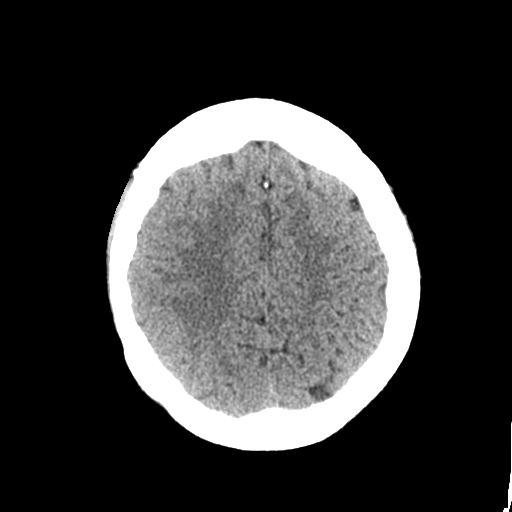
[im 23/30  brain]
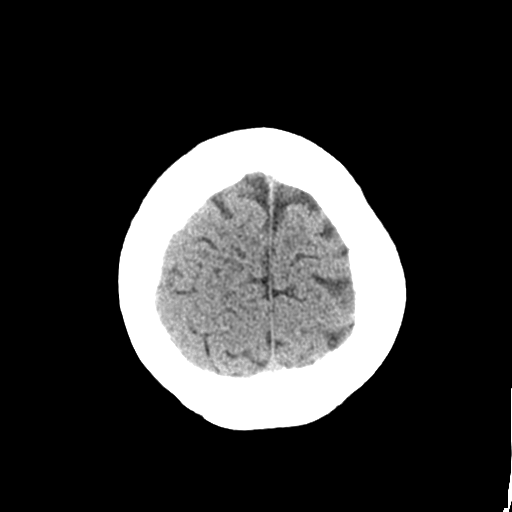
[im 25/30  brain]
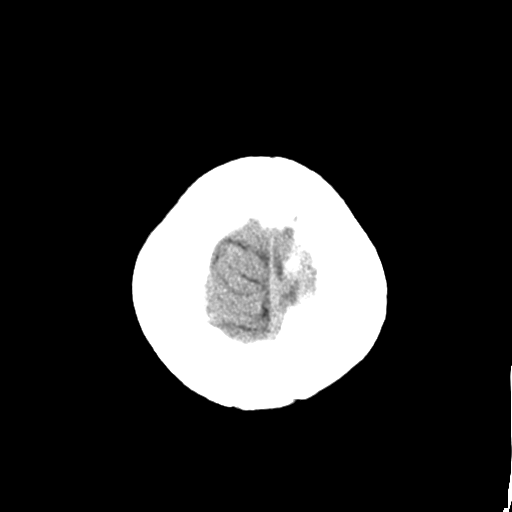
[im 25/30  bone]
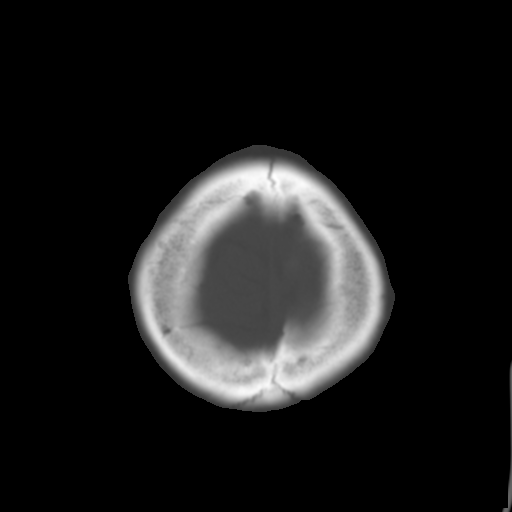
[im 28/30  brain]
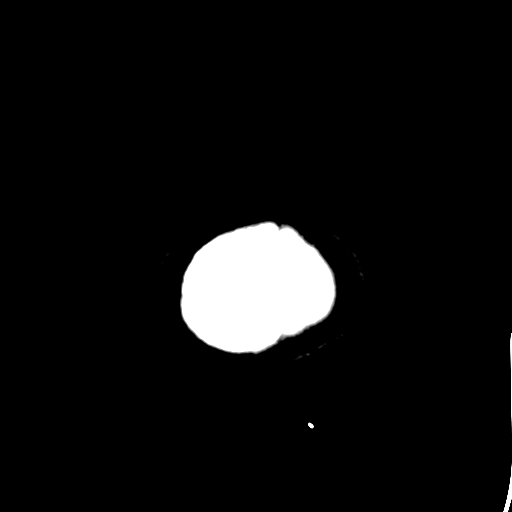

[Series 5: coronal soft · coronal · 0.33mm/px · 3 of 69 slices shown]
[im 23/69  brain]
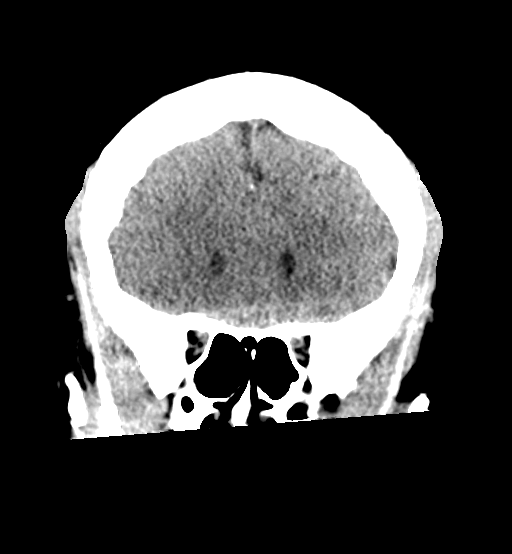
[im 31/69  brain]
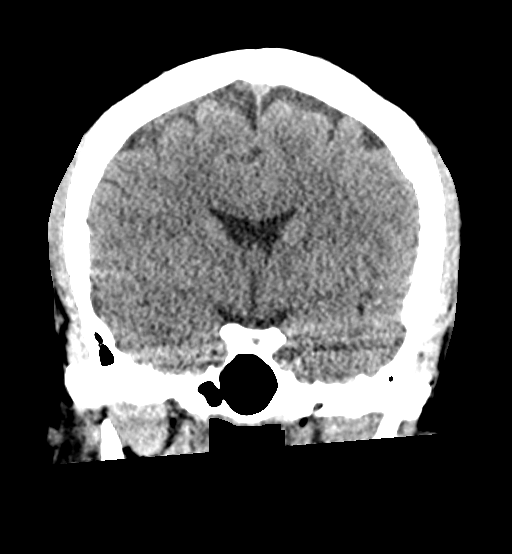
[im 38/69  brain]
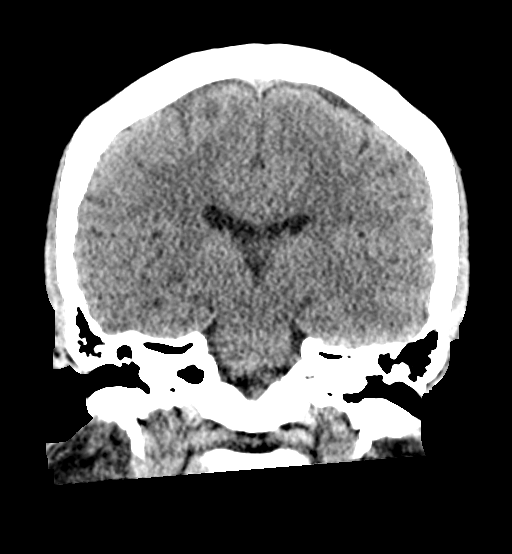

[Series 6: sagittal soft · sagittal · 0.36mm/px · 3 of 53 slices shown]
[im 18/53  brain]
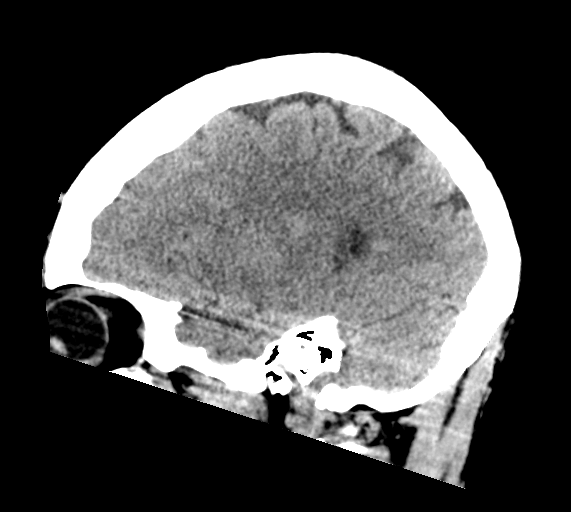
[im 27/53  brain]
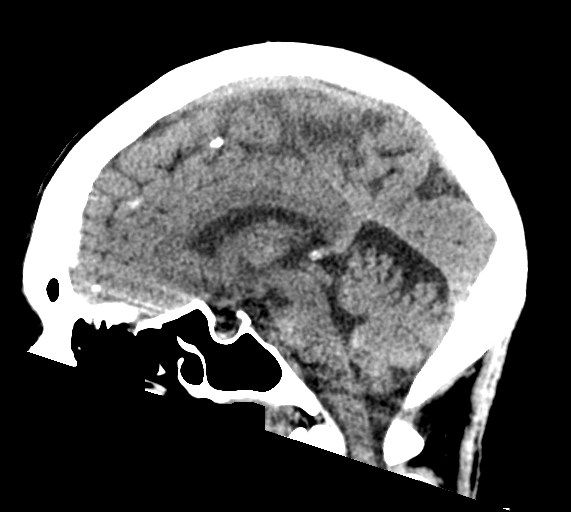
[im 35/53  brain]
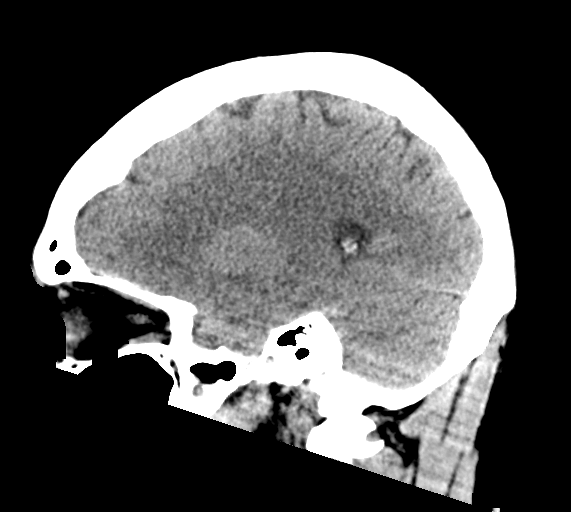

[16 of 47 positions shown; findings below may reference images not displayed]

FINDINGS: Brain: No evidence of acute infarction, hemorrhage, hydrocephalus,
extra-axial collection or mass lesion/mass effect.

Vascular: No hyperdense vessel or unexpected calcification.

Skull: Normal. Negative for fracture or focal lesion.

Sinuses/Orbits: Globes and orbits are unremarkable. Visualized
sinuses and mastoid air cells are clear.

Other: None.
IMPRESSION: Normal unenhanced CT scan of the brain.

## 2021-11-27 ENCOUNTER — Encounter (HOSPITAL_COMMUNITY): Payer: Self-pay

## 2021-11-27 ENCOUNTER — Other Ambulatory Visit: Payer: Self-pay

## 2021-11-27 ENCOUNTER — Emergency Department (HOSPITAL_COMMUNITY)
Admission: EM | Admit: 2021-11-27 | Discharge: 2021-11-27 | Disposition: A | Discharge status: Home / Self Care | Payer: Medicaid Other | Attending: Emergency Medicine | Admitting: Emergency Medicine

## 2021-11-27 DIAGNOSIS — R519 Headache, unspecified: Secondary | ICD-10-CM | POA: Insufficient documentation

## 2021-11-27 DIAGNOSIS — S40862A Insect bite (nonvenomous) of left upper arm, initial encounter: Secondary | ICD-10-CM | POA: Diagnosis not present

## 2021-11-27 DIAGNOSIS — R531 Weakness: Secondary | ICD-10-CM | POA: Insufficient documentation

## 2021-11-27 DIAGNOSIS — R42 Dizziness and giddiness: Secondary | ICD-10-CM | POA: Insufficient documentation

## 2021-11-27 DIAGNOSIS — R112 Nausea with vomiting, unspecified: Secondary | ICD-10-CM | POA: Diagnosis not present

## 2021-11-27 DIAGNOSIS — M545 Low back pain, unspecified: Secondary | ICD-10-CM | POA: Insufficient documentation

## 2021-11-27 DIAGNOSIS — W57XXXA Bitten or stung by nonvenomous insect and other nonvenomous arthropods, initial encounter: Secondary | ICD-10-CM | POA: Diagnosis not present

## 2021-11-27 DIAGNOSIS — M791 Myalgia, unspecified site: Secondary | ICD-10-CM | POA: Insufficient documentation

## 2021-11-27 LAB — COMPREHENSIVE METABOLIC PANEL
ALT: 22 U/L (ref 0–44)
AST: 29 U/L (ref 15–41)
Albumin: 3 g/dL — ABNORMAL LOW (ref 3.5–5.0)
Alkaline Phosphatase: 82 U/L (ref 38–126)
Anion gap: 8 (ref 5–15)
BUN: 12 mg/dL (ref 6–20)
CO2: 24 mmol/L (ref 22–32)
Calcium: 9.1 mg/dL (ref 8.9–10.3)
Chloride: 107 mmol/L (ref 98–111)
Creatinine, Ser: 1 mg/dL (ref 0.44–1.00)
GFR, Estimated: >60 mL/min (ref >60)
Glucose, Bld: 117 mg/dL — ABNORMAL HIGH (ref 70–99)
Potassium: 3.5 mmol/L (ref 3.5–5.1)
Sodium: 139 mmol/L (ref 135–145)
Total Bilirubin: 0.4 mg/dL (ref 0.3–1.2)
Total Protein: 7.6 g/dL (ref 6.5–8.1)

## 2021-11-27 LAB — CBC WITH DIFFERENTIAL/PLATELET
Abs Immature Granulocytes: 0.02 10*3/uL (ref 0.00–0.07)
Basophils Absolute: 0 10*3/uL (ref 0.0–0.1)
Basophils Relative: 1 %
Eosinophils Absolute: 0.1 10*3/uL (ref 0.0–0.5)
Eosinophils Relative: 2 %
HCT: 39.8 % (ref 36.0–46.0)
Hemoglobin: 12.8 g/dL (ref 12.0–15.0)
Immature Granulocytes: 0 %
Lymphocytes Relative: 12 %
Lymphs Abs: 0.7 10*3/uL (ref 0.7–4.0)
MCH: 29 pg (ref 26.0–34.0)
MCHC: 32.2 g/dL (ref 30.0–36.0)
MCV: 90.2 fL (ref 80.0–100.0)
Monocytes Absolute: 0.4 10*3/uL (ref 0.1–1.0)
Monocytes Relative: 7 %
Neutro Abs: 4.3 10*3/uL (ref 1.7–7.7)
Neutrophils Relative %: 78 %
Platelets: 293 10*3/uL (ref 150–400)
RBC: 4.41 MIL/uL (ref 3.87–5.11)
RDW: 13.5 % (ref 11.5–15.5)
WBC: 5.5 10*3/uL (ref 4.0–10.5)
nRBC: 0 % (ref 0.0–0.2)

## 2021-11-27 MED ORDER — DOXYCYCLINE HYCLATE 100 MG PO CAPS: 100.0000 mg | ORAL_CAPSULE | Freq: Two times a day (BID) | ORAL | 0 refills | Status: DC

## 2021-11-27 MED ORDER — DOXYCYCLINE HYCLATE 100 MG PO TABS
100.0000 mg | ORAL_TABLET | Freq: Once | ORAL | Status: AC
  Administered 2021-11-27: 100 mg via ORAL
  Filled 2021-11-27: qty 1

## 2021-11-27 MED ORDER — ONDANSETRON HCL 4 MG/2ML IJ SOLN
4.0000 mg | Freq: Once | INTRAMUSCULAR | Status: AC
  Administered 2021-11-27: 4 mg via INTRAVENOUS
  Filled 2021-11-27: qty 2

## 2021-11-27 MED ORDER — CYCLOBENZAPRINE HCL 10 MG PO TABS
10.0000 mg | ORAL_TABLET | Freq: Once | ORAL | Status: AC
  Administered 2021-11-27: 10 mg via ORAL
  Filled 2021-11-27: qty 1

## 2021-11-27 MED ORDER — SODIUM CHLORIDE 0.9 % IV BOLUS
1000.0000 mL | Freq: Once | INTRAVENOUS | Status: AC
  Administered 2021-11-27: 1000 mL via INTRAVENOUS

## 2021-11-27 MED ORDER — HYDROCODONE-ACETAMINOPHEN 5-325 MG PO TABS
1.0000 | ORAL_TABLET | Freq: Once | ORAL | Status: AC
  Administered 2021-11-27: 1 via ORAL
  Filled 2021-11-27: qty 1

## 2021-11-27 NOTE — ED Provider Notes (Addendum)
?[facility] ?Provider Note ? ? ?CSN: [id number] ?Arrival date & time: 11/27/21  1910 ? ?  ? ?History ? ?Chief Complaint  ?Patient presents with  ? multiple complaints  ?  Tick bite, N/v x 1 week, back pain x week, boils left axilla  ? ? ?[facility] is a 36 y.o. female with medical history to include epilepsy, depression, cocaine abuse.  Patient presents to ED complaining of multiple complaints.  First complaint is that the patient found a tick on her right posterior upper arm last night and she states it has been there for 2 weeks, patient unable to explain why she believes it has been there for 2 weeks.  Patient also complaining of low back pain that has been there for "over 3 months".  Patient denies any preceding trauma or event to account for back pain.  Patient denies any red flag symptoms.  Patient also complaining of "lumps" underneath her arms.  Most likely hidradenitis suppurativa.  Patient endorsing lumps on her arms, back pain, tick bite, nausea, vomiting, headaches, body aches and chills, lightheadedness, dizziness, weakness.  Patient denies any fevers, chest pain, shortness of breath, abdominal pain, neck stiffness, light sensitivity, rash, arthralgias. ? ? ?HPI ? ?  ? ?Home Medications ?Prior to Admission medications   ?Medication Sig Start Date End Date Taking? Authorizing Provider  ?acetaminophen (TYLENOL) 500 MG tablet Take 1,000 mg by mouth every 6 (six) hours as needed.   Yes [provider]  ?albuterol (VENTOLIN HFA) 108 (90 Base) MCG/ACT inhaler Inhale 2 puffs into the lungs every 6 (six) hours as needed for wheezing or shortness of breath. 06/26/19  Yes Shon Hale, MD  ?doxycycline (VIBRAMYCIN) 100 MG capsule Take 1 capsule (100 mg total) by mouth 2 (two) times daily. 11/27/21  Yes Al Decant, PA-C  ?levETIRAcetam (KEPPRA) 500 MG tablet Take 1 tablet (500 mg total) by mouth 2 (two) times daily. ?Patient not taking: Reported on 08/04/2019 06/26/19    Shon Hale, MD  ?levETIRAcetam (KEPPRA) 500 MG tablet Take 1 tablet (500 mg total) by mouth 2 (two) times daily. ?Patient not taking: Reported on 11/27/2021 08/05/19   Sabas Sous, MD  ?methocarbamol (ROBAXIN) 500 MG tablet Take 1 tablet (500 mg total) by mouth 2 (two) times daily. ?Patient not taking: Reported on 11/27/2021 10/31/19   Arlyn Dunning, PA-C  ?ferrous sulfate 325 (65 FE) MG tablet Take 1 tablet (325 mg total) by mouth 2 (two) times daily with a meal. ?Patient not taking: Reported on 08/04/2019 06/26/19 10/15/19  Shon Hale, MD  ?pantoprazole (PROTONIX) 40 MG tablet Take 1 tablet (40 mg total) by mouth daily. ?Patient not taking: Reported on 08/04/2019 06/26/19 10/15/19  Shon Hale, MD  ?   ? ?Allergies    ?Amoxicillin and Penicillins   ? ?Review of Systems   ?Review of Systems  ?Constitutional:  Positive for chills. Negative for fever.  ?Eyes:  Negative for photophobia.  ?Respiratory:  Negative for shortness of breath.   ?Cardiovascular:  Negative for chest pain.  ?Gastrointestinal:  Positive for nausea and vomiting. Negative for abdominal pain.  ?Musculoskeletal:  Positive for back pain. Negative for arthralgias, neck pain and neck stiffness.  ?Skin:  Negative for rash.  ?Neurological:  Positive for dizziness, weakness, light-headedness and headaches.  ?All other systems reviewed and are negative. ? ?Physical Exam ?Updated Vital Signs ?BP 110/81   Pulse 87   Temp 98.2 ?F (36.8 ?C) (Oral)   Resp 17   Ht  5\' 6"  (1.676 m)   Wt 100.7 kg   SpO2 99%   BMI 35.83 kg/m?  ?Physical Exam ?Vitals and nursing note reviewed.  ?Constitutional:   ?   General: She is not in acute distress. ?   Appearance: Normal appearance. She is not ill-appearing, toxic-appearing or diaphoretic.  ?HENT:  ?   Head: Normocephalic and atraumatic.  ?   Nose: Nose normal. No congestion.  ?   Mouth/Throat:  ?   Mouth: Mucous membranes are moist.  ?   Pharynx: Oropharynx is clear.  ?Eyes:  ?   Extraocular Movements:  Extraocular movements intact.  ?   Conjunctiva/sclera: Conjunctivae normal.  ?   Pupils: Pupils are equal, round, and reactive to light.  ?Cardiovascular:  ?   Rate and Rhythm: Normal rate and regular rhythm.  ?Pulmonary:  ?   Effort: Pulmonary effort is normal.  ?   Breath sounds: Normal breath sounds. No wheezing.  ?Abdominal:  ?   General: Abdomen is flat. Bowel sounds are normal.  ?   Palpations: Abdomen is soft.  ?   Tenderness: There is no abdominal tenderness.  ?Musculoskeletal:  ?   Cervical back: Normal, normal range of motion and neck supple. No rigidity or tenderness.  ?   Thoracic back: Normal.  ?   Lumbar back: Tenderness present. No swelling, edema or deformity. Normal range of motion.  ?   Comments: Diffuse low back pain that is nonfocal.  ?Skin: ?   General: Skin is warm and dry.  ?   Capillary Refill: Capillary refill takes less than 2 seconds.  ?   Comments: One 3 cm nonfluctuating mass noted to patient left axillary area.  No drainage noted.   ? ?Patient left upper posterior arm shows signs of recent insect bite however no bullseye lesion or surrounding erythema.  ?Neurological:  ?   General: No focal deficit present.  ?   Mental Status: She is alert and oriented to person, place, and time.  ?   GCS: GCS eye subscore is 4. GCS verbal subscore is 5. GCS motor subscore is 6.  ?   Cranial Nerves: Cranial nerves 2-12 are intact. No cranial nerve deficit.  ?   Sensory: Sensation is intact. No sensory deficit.  ?   Motor: Motor function is intact. No weakness.  ?   Coordination: Coordination is intact. Heel to [facility] Test normal.  ? ? ?ED Results / Procedures / Treatments   ?Labs ?(all labs ordered are listed, but only abnormal results are displayed) ?Labs Reviewed  ?COMPREHENSIVE METABOLIC PANEL - Abnormal; Notable for the following components:  ?    Result Value  ? Glucose, Bld 117 (*)   ? Albumin 3.0 (*)   ? All other components within normal limits  ?CBC WITH DIFFERENTIAL/PLATELET  ? ? ?EKG ?EKG  Interpretation ? ?Date/Time:  Thursday November 27 2021 21:00:34 EDT ?Ventricular Rate:  94 ?PR Interval:  130 ?QRS Duration: 102 ?QT Interval:  365 ?QTC Calculation: 457 ?R Axis:   7 ?Text Interpretation: Sinus rhythm Nonspecific T wave abnormality Confirmed by Cathren LaineSteinl, Kevin ([id number]) on 11/27/2021 10:14:08 PM ? ?Radiology ?No results found. ? ?Procedures ?Procedures  ? ?Medications Ordered in ED ?Medications  ?HYDROcodone-acetaminophen (NORCO/VICODIN) 5-325 MG per tablet 1 tablet (has no administration in time range)  ?doxycycline (VIBRA-TABS) tablet 100 mg (has no administration in time range)  ?ondansetron [facility](ZOFRAN) injection 4 mg (4 mg Intravenous Given 11/27/21 2105)  ?sodium chloride 0.9 % bolus 1,000 mL (1,000 mLs  Intravenous New Bag/Given 11/27/21 2109)  ?cyclobenzaprine (FLEXERIL) tablet 10 mg (10 mg Oral Given 11/27/21 2108)  ? ? ?ED Course/ Medical Decision Making/ A&P ?  ?                        ?Medical Decision Making ?Amount and/or Complexity of Data Reviewed ?Labs: ordered. ? ?Risk ?Prescription drug management. ? ? ?36 year old female presents ED for evaluation of multiple complaints.  Please see HPI for further details. ? ?On examination, patient is afebrile, nontachycardic, nonhypoxic.  Patient lung sounds clear bilaterally.  Patient abdomen soft compressible all 4 quadrants.  Patient nontoxic in appearance.  Patient complaining of diffuse low back pain however she has full range of motion to her low back, able to bend over touch toes successfully.  Patient has no red flag symptoms to account for back pain.  Patient complaining of abscesses to left axillary area most likely hidradenitis suppurativa.  Area inspected, there is no fluctuant mass noted.  There is an area of induration, erythema however does not appear to need of incision and drainage at this point. ? ?In terms of the patient complaining of tick bite, patient has no fever, photophobia, arthralgias, rash on palms, abdominal pain.  ? ?Patient worked  up utilizing following labs imaging studies interpreted by me personally: ?- CBC unremarkable ?- CMP unremarkable ?- EKG sinus rhythm ? ?Patient given 10 milligrams Flexeril for back pain, 4 mg Zofran for nausea, 1 L no

## 2021-11-27 NOTE — Discharge Instructions (Addendum)
Return to the ED with any new symptoms such as rash on palms and soles, fevers ?Please follow-up with your PCP for any ongoing needs ?Please pick up antibiotics labs in for you.  You will take these for the next 7 days twice daily.  You have gotten your first dose here tonight. ? ?

## 2021-11-27 NOTE — ED Triage Notes (Signed)
Pt presents to ED with multiple complaints. Reports nausea and vomiting x one week, lower back pain x one week, boils to left axilla x several days, found tick on her yesterday and pulled it off.  ?

## 2021-12-29 ENCOUNTER — Inpatient Hospital Stay (HOSPITAL_COMMUNITY): Payer: Medicaid Other

## 2021-12-29 ENCOUNTER — Encounter (HOSPITAL_COMMUNITY): Payer: Self-pay | Admitting: Emergency Medicine

## 2021-12-29 ENCOUNTER — Other Ambulatory Visit: Payer: Self-pay

## 2021-12-29 ENCOUNTER — Inpatient Hospital Stay (HOSPITAL_COMMUNITY)
Admission: EM | Admit: 2021-12-29 | Discharge: 2022-01-04 | DRG: 206 | Disposition: A | Discharge status: Home Health | Payer: Medicaid Other | Attending: Internal Medicine | Admitting: Internal Medicine

## 2021-12-29 ENCOUNTER — Emergency Department (HOSPITAL_COMMUNITY): Payer: Medicaid Other

## 2021-12-29 DIAGNOSIS — K2100 Gastro-esophageal reflux disease with esophagitis, without bleeding: Secondary | ICD-10-CM | POA: Diagnosis not present

## 2021-12-29 DIAGNOSIS — R1319 Other dysphagia: Secondary | ICD-10-CM

## 2021-12-29 DIAGNOSIS — Z8249 Family history of ischemic heart disease and other diseases of the circulatory system: Secondary | ICD-10-CM

## 2021-12-29 DIAGNOSIS — K2970 Gastritis, unspecified, without bleeding: Secondary | ICD-10-CM | POA: Diagnosis present

## 2021-12-29 DIAGNOSIS — Z820 Family history of epilepsy and other diseases of the nervous system: Secondary | ICD-10-CM

## 2021-12-29 DIAGNOSIS — R1314 Dysphagia, pharyngoesophageal phase: Secondary | ICD-10-CM | POA: Diagnosis present

## 2021-12-29 DIAGNOSIS — J984 Other disorders of lung: Secondary | ICD-10-CM | POA: Diagnosis not present

## 2021-12-29 DIAGNOSIS — R162 Hepatomegaly with splenomegaly, not elsewhere classified: Secondary | ICD-10-CM | POA: Diagnosis present

## 2021-12-29 DIAGNOSIS — K219 Gastro-esophageal reflux disease without esophagitis: Secondary | ICD-10-CM | POA: Diagnosis not present

## 2021-12-29 DIAGNOSIS — K222 Esophageal obstruction: Secondary | ICD-10-CM | POA: Diagnosis present

## 2021-12-29 DIAGNOSIS — R918 Other nonspecific abnormal finding of lung field: Secondary | ICD-10-CM | POA: Diagnosis not present

## 2021-12-29 DIAGNOSIS — F17210 Nicotine dependence, cigarettes, uncomplicated: Secondary | ICD-10-CM | POA: Diagnosis present

## 2021-12-29 DIAGNOSIS — R590 Localized enlarged lymph nodes: Secondary | ICD-10-CM | POA: Diagnosis present

## 2021-12-29 DIAGNOSIS — R509 Fever, unspecified: Secondary | ICD-10-CM | POA: Diagnosis not present

## 2021-12-29 DIAGNOSIS — Z560 Unemployment, unspecified: Secondary | ICD-10-CM | POA: Diagnosis not present

## 2021-12-29 DIAGNOSIS — K143 Hypertrophy of tongue papillae: Secondary | ICD-10-CM | POA: Diagnosis present

## 2021-12-29 DIAGNOSIS — F32A Depression, unspecified: Secondary | ICD-10-CM | POA: Diagnosis present

## 2021-12-29 DIAGNOSIS — I38 Endocarditis, valve unspecified: Secondary | ICD-10-CM | POA: Diagnosis not present

## 2021-12-29 DIAGNOSIS — E669 Obesity, unspecified: Secondary | ICD-10-CM | POA: Diagnosis not present

## 2021-12-29 DIAGNOSIS — G40909 Epilepsy, unspecified, not intractable, without status epilepticus: Secondary | ICD-10-CM | POA: Diagnosis present

## 2021-12-29 DIAGNOSIS — R1310 Dysphagia, unspecified: Secondary | ICD-10-CM | POA: Diagnosis not present

## 2021-12-29 DIAGNOSIS — Z833 Family history of diabetes mellitus: Secondary | ICD-10-CM | POA: Diagnosis not present

## 2021-12-29 DIAGNOSIS — R0602 Shortness of breath: Secondary | ICD-10-CM | POA: Diagnosis present

## 2021-12-29 DIAGNOSIS — Z809 Family history of malignant neoplasm, unspecified: Secondary | ICD-10-CM | POA: Diagnosis not present

## 2021-12-29 DIAGNOSIS — Z6835 Body mass index (BMI) 35.0-35.9, adult: Secondary | ICD-10-CM

## 2021-12-29 DIAGNOSIS — W010XXA Fall on same level from slipping, tripping and stumbling without subsequent striking against object, initial encounter: Secondary | ICD-10-CM | POA: Diagnosis present

## 2021-12-29 DIAGNOSIS — R627 Adult failure to thrive: Secondary | ICD-10-CM | POA: Diagnosis present

## 2021-12-29 DIAGNOSIS — Z79899 Other long term (current) drug therapy: Secondary | ICD-10-CM

## 2021-12-29 LAB — BASIC METABOLIC PANEL
Anion gap: 8 (ref 5–15)
BUN: 9 mg/dL (ref 6–20)
CO2: 24 mmol/L (ref 22–32)
Calcium: 9.3 mg/dL (ref 8.9–10.3)
Chloride: 105 mmol/L (ref 98–111)
Creatinine, Ser: 0.86 mg/dL (ref 0.44–1.00)
GFR, Estimated: >60 mL/min (ref >60)
Glucose, Bld: 106 mg/dL — ABNORMAL HIGH (ref 70–99)
Potassium: 4 mmol/L (ref 3.5–5.1)
Sodium: 137 mmol/L (ref 135–145)

## 2021-12-29 LAB — HIV ANTIBODY (ROUTINE TESTING W REFLEX): HIV Screen 4th Generation wRfx: NONREACTIVE

## 2021-12-29 LAB — CBC
HCT: 43.4 % (ref 36.0–46.0)
Hemoglobin: 13.5 g/dL (ref 12.0–15.0)
MCH: 28.2 pg (ref 26.0–34.0)
MCHC: 31.1 g/dL (ref 30.0–36.0)
MCV: 90.6 fL (ref 80.0–100.0)
Platelets: 354 10*3/uL (ref 150–400)
RBC: 4.79 MIL/uL (ref 3.87–5.11)
RDW: 13.2 % (ref 11.5–15.5)
WBC: 6.7 10*3/uL (ref 4.0–10.5)
nRBC: 0 % (ref 0.0–0.2)

## 2021-12-29 LAB — TROPONIN I (HIGH SENSITIVITY)
Troponin I (High Sensitivity): <2 ng/L (ref <18)
Troponin I (High Sensitivity): <2 ng/L (ref <18)

## 2021-12-29 LAB — LACTIC ACID, PLASMA: Lactic Acid, Venous: 1.8 mmol/L (ref 0.5–1.9)

## 2021-12-29 MED ORDER — TRAZODONE HCL 50 MG PO TABS
25.0000 mg | ORAL_TABLET | Freq: Every evening | ORAL | Status: DC | PRN
  Administered 2021-12-31 – 2022-01-03 (×4): 25 mg via ORAL
  Filled 2021-12-29 (×4): qty 1

## 2021-12-29 MED ORDER — ONDANSETRON HCL 4 MG/2ML IJ SOLN
4.0000 mg | Freq: Four times a day (QID) | INTRAMUSCULAR | Status: DC | PRN
  Filled 2021-12-29: qty 2

## 2021-12-29 MED ORDER — IOHEXOL 300 MG/ML  SOLN
100.0000 mL | Freq: Once | INTRAMUSCULAR | Status: AC | PRN
  Administered 2021-12-29: 75 mL via INTRAVENOUS

## 2021-12-29 MED ORDER — ENOXAPARIN SODIUM 40 MG/0.4ML IJ SOSY
40.0000 mg | PREFILLED_SYRINGE | INTRAMUSCULAR | Status: DC
  Administered 2021-12-29 – 2022-01-03 (×5): 40 mg via SUBCUTANEOUS
  Filled 2021-12-29 (×6): qty 0.4

## 2021-12-29 MED ORDER — HYDROMORPHONE BOLUS VIA INFUSION: 0.5000 mg | INTRAVENOUS | Status: DC | PRN

## 2021-12-29 MED ORDER — ACETAMINOPHEN 325 MG PO TABS
650.0000 mg | ORAL_TABLET | Freq: Four times a day (QID) | ORAL | Status: DC | PRN
Start: 2021-12-29 — End: 2022-01-04

## 2021-12-29 MED ORDER — ALBUTEROL SULFATE (2.5 MG/3ML) 0.083% IN NEBU: 3.0000 mL | INHALATION_SOLUTION | Freq: Four times a day (QID) | RESPIRATORY_TRACT | Status: DC | PRN

## 2021-12-29 MED ORDER — VANCOMYCIN HCL IN DEXTROSE 1-5 GM/200ML-% IV SOLN
1000.0000 mg | Freq: Once | INTRAVENOUS | Status: AC
Start: 2021-12-29 — End: 2021-12-29
  Administered 2021-12-29: 1000 mg via INTRAVENOUS
  Filled 2021-12-29: qty 200

## 2021-12-29 MED ORDER — SODIUM CHLORIDE 0.9 % IV SOLN
1.0000 g | Freq: Three times a day (TID) | INTRAVENOUS | Status: DC
  Administered 2021-12-30: 1 g via INTRAVENOUS
  Filled 2021-12-29 (×5): qty 10

## 2021-12-29 MED ORDER — IOHEXOL 300 MG/ML  SOLN
100.0000 mL | Freq: Once | INTRAMUSCULAR | Status: AC | PRN
  Administered 2021-12-29: 80 mL via INTRAVENOUS

## 2021-12-29 MED ORDER — VANCOMYCIN HCL IN DEXTROSE 1-5 GM/200ML-% IV SOLN
1000.0000 mg | Freq: Two times a day (BID) | INTRAVENOUS | Status: DC
  Administered 2021-12-30 – 2021-12-31 (×3): 1000 mg via INTRAVENOUS
  Filled 2021-12-29 (×3): qty 200

## 2021-12-29 MED ORDER — SODIUM CHLORIDE 0.9 % IV SOLN
2000.0000 mg | Freq: Once | INTRAVENOUS | Status: DC
  Filled 2021-12-29: qty 20

## 2021-12-29 MED ORDER — OXYCODONE HCL 5 MG PO TABS
10.0000 mg | ORAL_TABLET | ORAL | Status: DC | PRN
  Administered 2021-12-29 – 2022-01-04 (×17): 10 mg via ORAL
  Filled 2021-12-29 (×18): qty 2

## 2021-12-29 MED ORDER — TRAMADOL HCL 50 MG PO TABS: 100.0000 mg | ORAL_TABLET | Freq: Four times a day (QID) | ORAL | Status: DC | PRN

## 2021-12-29 MED ORDER — SODIUM CHLORIDE 0.9% FLUSH
3.0000 mL | Freq: Two times a day (BID) | INTRAVENOUS | Status: DC
  Administered 2021-12-29 – 2022-01-04 (×10): 3 mL via INTRAVENOUS

## 2021-12-29 MED ORDER — ONDANSETRON HCL 4 MG PO TABS: 4.0000 mg | ORAL_TABLET | Freq: Four times a day (QID) | ORAL | Status: DC | PRN

## 2021-12-29 MED ORDER — SODIUM CHLORIDE 0.9 % IV SOLN: INTRAVENOUS | Status: DC

## 2021-12-29 MED ORDER — POLYETHYLENE GLYCOL 3350 17 G PO PACK
17.0000 g | PACK | Freq: Every day | ORAL | Status: DC | PRN
  Administered 2022-01-02 – 2022-01-03 (×2): 17 g via ORAL
  Filled 2021-12-29 (×2): qty 1

## 2021-12-29 MED ORDER — TUBERCULIN PPD 5 UNIT/0.1ML ID SOLN
5.0000 [IU] | Freq: Once | INTRADERMAL | Status: AC
  Administered 2021-12-29: 5 [IU] via INTRADERMAL
  Filled 2021-12-29: qty 0.1

## 2021-12-29 MED ORDER — ACETAMINOPHEN 650 MG RE SUPP: 650.0000 mg | Freq: Four times a day (QID) | RECTAL | Status: DC | PRN

## 2021-12-29 MED ORDER — HYDROMORPHONE HCL 1 MG/ML IJ SOLN
1.0000 mg | Freq: Once | INTRAMUSCULAR | Status: AC
  Administered 2021-12-29: 1 mg via INTRAVENOUS
  Filled 2021-12-29: qty 1

## 2021-12-29 MED ORDER — KETOROLAC TROMETHAMINE 30 MG/ML IJ SOLN
30.0000 mg | Freq: Once | INTRAMUSCULAR | Status: AC
  Administered 2021-12-29: 30 mg via INTRAVENOUS
  Filled 2021-12-29: qty 1

## 2021-12-29 MED ORDER — FENTANYL CITRATE PF 50 MCG/ML IJ SOSY
50.0000 ug | PREFILLED_SYRINGE | Freq: Once | INTRAMUSCULAR | Status: AC
  Administered 2021-12-29: 50 ug via INTRAVENOUS
  Filled 2021-12-29: qty 1

## 2021-12-29 MED ORDER — VANCOMYCIN HCL IN DEXTROSE 1-5 GM/200ML-% IV SOLN
1000.0000 mg | Freq: Two times a day (BID) | INTRAVENOUS | Status: DC
  Administered 2021-12-29: 1000 mg via INTRAVENOUS
  Filled 2021-12-29: qty 200

## 2021-12-29 MED ORDER — SODIUM CHLORIDE 0.9 % IV SOLN
2.0000 g | Freq: Once | INTRAVENOUS | Status: AC
  Administered 2021-12-29: 2 g via INTRAVENOUS
  Filled 2021-12-29: qty 12.5

## 2021-12-29 MED ORDER — HYDROMORPHONE HCL 1 MG/ML IJ SOLN
0.5000 mg | INTRAMUSCULAR | Status: DC | PRN
  Administered 2021-12-30 – 2022-01-04 (×27): 0.5 mg via INTRAVENOUS
  Filled 2021-12-29 (×28): qty 0.5

## 2021-12-29 NOTE — H&P (Signed)
?History and Physical  ? ? ?[facility] HER:[id number] DOB: [birth year] DOA: 12/29/2021 ? ?PCP: Patient, No Pcp Per (Inactive)  ?Patient coming from: Home ? ?I have personally briefly reviewed patient's old medical records in [facility] ? ?Chief Complaint: Shortness of breath for several days ? ?HPI: [facility] is a 36 y.o. 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  Relating on flat ground she tripped and fell forward landing on her chest she did not hit her head or lose consciousness.  Is had pain in her sternum since the fall which has been constant sharp and worse worse with inspiration.  She has tried Tylenol and ibuprofen with no help to her symptoms she has not had any seizures since pregnancy.  She does not feel short of breath she does appear winded.  She speaks in short phrases he has not used any alcohol or illicit drugs for quite some time and has no history of coronary syndrome.  Patient states that she is fatigued all the time and has been feeling very poorly and not sleeping well at all recently. ? ?ED Course: Work-up ensued and a CT scan of the chest was ordered which showed multiple irregular densities noted throughout both lungs many of which are cavitary and the largest measures approximately 3.3 cm.  She has extensive mediastinal adenopathy as well as enlarged adenopathy in the porta hepatis region of the abdomen.  Her densities may represent septic emboli or multifocal infection they are concerning for metastatic disease given the extensive adenopathy as described CT scan of the abdomen and pelvis is recommended for further evaluation per radiology. ?Case was discussed with pulmonary who is extremely concerned about septic emboli.  They wish for her to be admitted  for further evaluation and management and plan to consult on her tomorrow.  ER spoke with Dr. Sherene Sires. ? ?Review of Systems: As per HPI otherwise all other systems reviewed and  negative.  Normal.'s recently of shorter duration 2 days as opposed to 5 days.  No bleeding from rectum no other bleeding issues.  No weight loss, no weight gain, no problems with appetite.  No night sweats fevers or chills ? ?Past Medical History:  ?Diagnosis Date  ? Cocaine abuse (HCC)   ? Depression   ? Epilepsy (HCC)   ? ? ?Past Surgical History:  ?Procedure Laterality Date  ? HERNIA REPAIR    ? umbilical hernia repair x 2.  ? ? ?Social History  ? ?Social History Narrative  ? Not on file  ? ? ? reports that she has been smoking cigarettes. She has been smoking an average of 1 pack per day. She has never used smokeless tobacco. She reports that she does not currently use alcohol. She reports that she does not currently use drugs after having used the following drugs: Cocaine. ? ?Allergies  ?Allergen Reactions  ? Amoxicillin Rash  ? Penicillins Rash  ?  Has patient had a PCN reaction causing immediate rash, facial/tongue/throat swelling, SOB or lightheadedness with hypotension: Yes ?Has patient had a PCN reaction causing severe rash involving mucus membranes or skin necrosis: No ?Has patient had a PCN reaction that required hospitalization No ?Has patient had a PCN reaction occurring within the last 10 years: Yes ?If all of the above answers are "NO", then may proceed  with Cephalosporin use. ?  ? ? ?Family History  ?Problem Relation Age of Onset  ? Hypertension Other   ? Diabetes Other   ? Epilepsy Other   ? Seizures Sister   ? Hypertension Maternal Grandmother   ? Kidney disease Maternal Grandmother   ? ? ? ?Prior to Admission medications   ?Medication Sig Start Date End Date Taking? Authorizing Provider  ?acetaminophen (TYLENOL) 500 MG tablet Take 1,000 mg by mouth every 6 (six) hours as needed for mild pain.   Yes [provider]   ?albuterol (VENTOLIN HFA) 108 (90 Base) MCG/ACT inhaler Inhale 2 puffs into the lungs every 6 (six) hours as needed for wheezing or shortness of breath. ?Patient not taking: Reported on 12/29/2021 06/26/19   Shon Hale, MD  ?doxycycline (VIBRAMYCIN) 100 MG capsule Take 1 capsule (100 mg total) by mouth 2 (two) times daily. ?Patient not taking: Reported on 12/29/2021 11/27/21   Al Decant, PA-C  ?levETIRAcetam (KEPPRA) 500 MG tablet Take 1 tablet (500 mg total) by mouth 2 (two) times daily. ?Patient not taking: Reported on 08/04/2019 06/26/19   Shon Hale, MD  ?levETIRAcetam (KEPPRA) 500 MG tablet Take 1 tablet (500 mg total) by mouth 2 (two) times daily. ?Patient not taking: Reported on 11/27/2021 08/05/19   Sabas Sous, MD  ?methocarbamol (ROBAXIN) 500 MG tablet Take 1 tablet (500 mg total) by mouth 2 (two) times daily. ?Patient not taking: Reported on 11/27/2021 10/31/19   Arlyn Dunning, PA-C  ?ferrous sulfate 325 (65 FE) MG tablet Take 1 tablet (325 mg total) by mouth 2 (two) times daily with a meal. ?Patient not taking: Reported on 08/04/2019 06/26/19 10/15/19  Shon Hale, MD  ?pantoprazole (PROTONIX) 40 MG tablet Take 1 tablet (40 mg total) by mouth daily. ?Patient not taking: Reported on 08/04/2019 06/26/19 10/15/19  Shon Hale, MD  ? ? ?Physical Exam: ? ?Constitutional: NAD, calm, comfortable; becomes winded when speaking in paragraphs ?Vitals:  ? 12/29/21 1645 12/29/21 1700 12/29/21 1715 12/29/21 1730  ?BP: (!) 124/105 (!) 126/95 (!) 128/95 (!) 109/98  ?Pulse: 89 90 92 89  ?Resp: 14 18 18 18   ?Temp:      ?TempSrc:      ?SpO2: 98% 91% 94% 93%  ?Weight:      ?Height:      ? ?Eyes: PERRL, lids and conjunctivae normal ?ENMT: Mucous membranes are moist. Posterior pharynx clear of any exudate or lesions.Normal dentition.  ?Neck: normal, supple, no masses, no thyromegaly ?Respiratory: clear to auscultation bilaterally, coarse breath sounds bilaterally;  increased respiratory effort.  Minimal  accessory muscle use ?Cardiovascular: Regular rate and rhythm, no murmurs / rubs / gallops. No extremity edema. 2+ pedal pulses. No carotid bruits.  ?Abdomen: no tenderness, no masses palpated. No hepatosplenomegaly. Bowel sounds positive.  ?Musculoskeletal: no clubbing / cyanosis. No joint deformity upper and lower extremities. Good ROM, no contractures. Normal muscle tone.  ?Skin: no rashes, lesions, ulcers. No induration ?Neurologic: CN 2-12 grossly intact. Sensation intact, DTR normal. Strength 5/5 in all 4.  ?Psychiatric: Normal judgment and insight. Alert and oriented x 3. Normal mood.  ? ? ?Labs on Admission: I have personally reviewed following labs and imaging studies.   ? ?CBC: ?Recent Labs  ?Lab 12/29/21 ?1315  ?WBC 6.7  ?HGB 13.5  ?HCT 43.4  ?MCV 90.6  ?PLT 354  ? ?Basic Metabolic Panel: ?Recent Labs  ?Lab 12/29/21 ?1315  ?NA 137  ?K 4.0  ?CL 105  ?CO2 24  ?GLUCOSE 106*  ?  BUN 9  ?CREATININE 0.86  ?CALCIUM 9.3  ? ?Urine analysis: ?   ?Component Value Date/Time  ? COLORURINE YELLOW 09/16/2019 1426  ? APPEARANCEUR HAZY (A) 09/16/2019 1426  ? APPEARANCEUR Clear 01/23/2019 1503  ? LABSPEC 1.015 09/16/2019 1426  ? PHURINE 5.0 09/16/2019 1426  ? GLUCOSEU NEGATIVE 09/16/2019 1426  ? HGBUR NEGATIVE 09/16/2019 1426  ? BILIRUBINUR NEGATIVE 09/16/2019 1426  ? BILIRUBINUR Negative 01/23/2019 1503  ? KETONESUR NEGATIVE 09/16/2019 1426  ? PROTEINUR NEGATIVE 09/16/2019 1426  ? UROBILINOGEN 4.0 (H) 08/15/2014 1816  ? NITRITE NEGATIVE 09/16/2019 1426  ? LEUKOCYTESUR TRACE (A) 09/16/2019 1426  ? ? ?Radiological Exams on Admission: ?DG Chest 2 View ? ?Result Date: 12/29/2021 ?CLINICAL DATA:  Chest pain.  Shortness of breath.  Fall yesterday. EXAM: CHEST - 2 VIEW COMPARISON:  10/09/2018 FINDINGS: Normal heart size. Lung volumes are low. No signs of pleural effusion or edema. Bilateral, multifocal airspace opacities are identified involving the right midlung, left midlung and left base. The visualized skeletal structures appear  intact. IMPRESSION: Bilateral multifocal airspace opacities compatible with multifocal infection. Electronically Signed   By: Signa Kellaylor  Stroud M.D.   On: 12/29/2021 13:37  ? ?CT Chest W Contrast ? ?Resul

## 2021-12-29 NOTE — ED Notes (Signed)
Admitting at BS

## 2021-12-29 NOTE — ED Notes (Signed)
Up to b/r, steady gait 

## 2021-12-29 NOTE — ED Triage Notes (Signed)
Pt states she fell last night landing on chest, currently having chest pain 10/10, vague on mechanism of injury, unable to sleep last night d/t pain. ?

## 2021-12-29 NOTE — ED Notes (Addendum)
Delay in inpt bed. Pending negative pressure room. Expected after shift change. CT angio neck expected tomorrow (as she has already had contrasted CTs earlier today). ?

## 2021-12-29 NOTE — ED Notes (Signed)
Resting comfortably, remains on RA, VSS, afebrile, talking on phone, given juice per request. ?

## 2021-12-29 NOTE — ED Notes (Signed)
To xray via stretcher, moaning. Alert, NAD, calm, interactive.  ?

## 2021-12-29 NOTE — ED Notes (Signed)
Pulmonology into room, at [facility] ?

## 2021-12-29 NOTE — ED Provider Notes (Signed)
?[facility] ?Provider Note ? ? ?CSN: [id number] ?Arrival date & time: 12/29/21  1153 ? ?  ? ?History ? ?Chief Complaint  ?Patient presents with  ? Chest Pain  ? ? ?[facility] is a 36 y.o. female. ? ? ?Chest Pain ? ?Patient with medical history notable for cocaine use disorder, seizures on Keppra, depression presents today due to fall/chest pain.  Patient states yesterday during the day she was ambulating on flat ground when she tripped and fell forward landing on her chest, did not hit her head or lose consciousness.  She is been having pain in her sternum since then which is constant, sharp and worse with inspiration.  She states she has tried Tylenol and ibuprofen with no alleviation of her symptoms.  Denies any seizures, denies feeling short of breath.  Patient denies any use of alcohol or illicit drugs last night, no history of ACS. ? ?Home Medications ?Prior to Admission medications   ?Medication Sig Start Date End Date Taking? Authorizing Provider  ?acetaminophen (TYLENOL) 500 MG tablet Take 1,000 mg by mouth every 6 (six) hours as needed for mild pain.   Yes [provider]  ?albuterol (VENTOLIN HFA) 108 (90 Base) MCG/ACT inhaler Inhale 2 puffs into the lungs every 6 (six) hours as needed for wheezing or shortness of breath. ?Patient not taking: Reported on 12/29/2021 06/26/19   Shon HaleEmokpae, Courage, MD  ?doxycycline (VIBRAMYCIN) 100 MG capsule Take 1 capsule (100 mg total) by mouth 2 (two) times daily. ?Patient not taking: Reported on 12/29/2021 11/27/21   Al DecantGroce, Christopher F, PA-C  ?levETIRAcetam (KEPPRA) 500 MG tablet Take 1 tablet (500 mg total) by mouth 2 (two) times daily. ?Patient not taking: Reported on 08/04/2019 06/26/19   Shon HaleEmokpae, Courage, MD  ?levETIRAcetam (KEPPRA) 500 MG tablet Take 1 tablet (500 mg total) by mouth 2 (two) times daily. ?Patient not taking: Reported on 11/27/2021 08/05/19   Sabas SousBero, Michael M, MD  ?methocarbamol (ROBAXIN) 500 MG tablet Take 1 tablet (500 mg total)  by mouth 2 (two) times daily. ?Patient not taking: Reported on 11/27/2021 10/31/19   Arlyn DunningMcLean, Kelly A, PA-C  ?ferrous sulfate 325 (65 FE) MG tablet Take 1 tablet (325 mg total) by mouth 2 (two) times daily with a meal. ?Patient not taking: Reported on 08/04/2019 06/26/19 10/15/19  Shon HaleEmokpae, Courage, MD  ?pantoprazole (PROTONIX) 40 MG tablet Take 1 tablet (40 mg total) by mouth daily. ?Patient not taking: Reported on 08/04/2019 06/26/19 10/15/19  Shon HaleEmokpae, Courage, MD  ?   ? ?Allergies    ?Amoxicillin and Penicillins   ? ?Review of Systems   ?Review of Systems  ?Cardiovascular:  Positive for chest pain.  ? ?Physical Exam ?Updated Vital Signs ?BP 112/79   Pulse 86   Temp 97.8 ?F (36.6 ?C) (Oral)   Resp 19   Ht 5\' 8"  (1.727 m)   Wt 100.7 kg   LMP  (LMP Unknown)   SpO2 90%   BMI 33.75 kg/m?  ?Physical Exam ?Vitals and nursing note reviewed. Exam conducted with a chaperone present.  ?Constitutional:   ?   Appearance: Normal appearance.  ?   Comments: BMI 33.75  ?HENT:  ?   Head: Normocephalic and atraumatic.  ?Eyes:  ?   General: No scleral icterus.    ?   Right eye: No discharge.     ?   Left eye: No discharge.  ?   Extraocular Movements: Extraocular movements intact.  ?   Pupils: Pupils are equal, round, and reactive  to light.  ?Cardiovascular:  ?   Rate and Rhythm: Normal rate and regular rhythm.  ?   Pulses: Normal pulses.  ?   Heart sounds: Normal heart sounds. No murmur heard. ?  No friction rub. No gallop.  ?Pulmonary:  ?   Effort: Pulmonary effort is normal. No respiratory distress.  ?   Breath sounds: Normal breath sounds.  ?Chest:  ?   Chest wall: Tenderness present.  ?   Comments: Tenderness to the sternum, well localized pinpoint ?Abdominal:  ?   General: Abdomen is flat. Bowel sounds are normal. There is no distension.  ?   Palpations: Abdomen is soft.  ?   Tenderness: There is no abdominal tenderness.  ?Skin: ?   General: Skin is warm and dry.  ?   Coloration: Skin is not jaundiced.  ?Neurological:  ?   Mental  Status: She is alert. Mental status is at baseline.  ?   Coordination: Coordination normal.  ? ? ?ED Results / Procedures / Treatments   ?Labs ?(all labs ordered are listed, but only abnormal results are displayed) ?Labs Reviewed  ?BASIC METABOLIC PANEL - Abnormal; Notable for the following components:  ?    Result Value  ? Glucose, Bld 106 (*)   ? All other components within normal limits  ?CULTURE, BLOOD (ROUTINE X 2)  ?CULTURE, BLOOD (ROUTINE X 2)  ?CBC  ?PREGNANCY, URINE  ?LACTIC ACID, PLASMA  ?LACTIC ACID, PLASMA  ?QUANTIFERON-TB GOLD PLUS  ?ANCA PROFILE  ?TROPONIN I (HIGH SENSITIVITY)  ?TROPONIN I (HIGH SENSITIVITY)  ? ? ?EKG ?EKG Interpretation ? ?Date/Time:  Monday Dec 29 2021 12:15:11 EDT ?Ventricular Rate:  94 ?PR Interval:  132 ?QRS Duration: 80 ?QT Interval:  374 ?QTC Calculation: 467 ?R Axis:   9 ?Text Interpretation: Normal sinus rhythm Normal ECG When compared with ECG of 27-Nov-2021 21:00, PREVIOUS ECG IS PRESENT since last tracing no significant change Confirmed by Mancel Bale [id number]) on 12/29/2021 4:00:53 PM ? ?Radiology ?DG Chest 2 View ? ?Result Date: 12/29/2021 ?CLINICAL DATA:  Chest pain.  Shortness of breath.  Fall yesterday. EXAM: CHEST - 2 VIEW COMPARISON:  10/09/2018 FINDINGS: Normal heart size. Lung volumes are low. No signs of pleural effusion or edema. Bilateral, multifocal airspace opacities are identified involving the right midlung, left midlung and left base. The visualized skeletal structures appear intact. IMPRESSION: Bilateral multifocal airspace opacities compatible with multifocal infection. Electronically Signed   By: Signa Kell M.D.   On: 12/29/2021 13:37  ? ?CT Chest W Contrast ? ?Result Date: 12/29/2021 ?CLINICAL DATA:  Chest pain after fall last night. EXAM: CT CHEST WITH CONTRAST TECHNIQUE: Multidetector CT imaging of the chest was performed during intravenous contrast administration. RADIATION DOSE REDUCTION: This exam was performed according to the departmental  dose-optimization program which includes automated exposure control, adjustment of the mA and/or kV according to patient size and/or use of iterative reconstruction technique. CONTRAST:  59mL OMNIPAQUE IOHEXOL 300 MG/ML  SOLN COMPARISON:  Radiograph of same day. FINDINGS: Cardiovascular: No significant vascular findings. Normal heart size. No pericardial effusion. Mediastinum/Nodes: Small sliding-type hiatal hernia is noted. Thyroid gland is unremarkable. Mediastinal adenopathy is noted with 2.5 cm right hilar lymph node. 2.1 cm subcarinal lymph node is noted. 13 mm anterior mediastinal lymph node is noted. 14 mm precarinal lymph node is noted. Lungs/Pleura: No pneumothorax or pleural effusion is noted. Multiple irregular densities are noted throughout both lungs, many of which are cavitary. The largest measures 3.3 cm in right upper lobe. 3.3  x 2.1 cm cavitary abnormality is noted in left upper lobe. It is uncertain if these represent multifocal infection or septic emboli, or potentially metastatic disease. Upper Abdomen: Enlarged adenopathy is noted in the porta hepatis region concerning for malignancy or metastatic disease. Musculoskeletal: No chest wall abnormality. No acute or significant osseous findings. IMPRESSION: Multiple irregular densities are noted throughout both lungs, many of which are cavitary. The largest measures approximately 3.3 cm. There is also noted extensive mediastinal adenopathy, as well as enlarged adenopathy in the porta hepatis region of the abdomen. While these irregular densities may represent septic emboli or multifocal infection, they are concerning for metastatic disease given the extensive adenopathy as described. CT scan of the abdomen and pelvis is recommended for further evaluation. Electronically Signed   By: Lupita Raider M.D.   On: 12/29/2021 15:20   ? ?Procedures ?Procedures  ? ? ?Medications Ordered in ED ?Medications  ?HYDROmorphone (DILAUDID) injection 1 mg (has no  administration in time range)  ?ceFEPIme (MAXIPIME) 2 g in sodium chloride 0.9 % 100 mL IVPB (has no administration in time range)  ?vancomycin (VANCOCIN) 2,000 mg in sodium chloride 0.9 % 500 mL IVPB (has no administra

## 2021-12-29 NOTE — ED Notes (Addendum)
Pt made aware of need for urine sample. States, does not have to go/ just went recently. Reports pain increasing/ returning.  ?

## 2021-12-29 NOTE — ED Notes (Signed)
Reports tripped and fell yesterday, did not strike anything other than ground, c/o developing sternal pain last night, gradually progressively worse, (denies: LOC, wound, bleeding, sob, NVD, dizziness, weakness, fever, cough, hitting head, or sz activity). Last sz last month. Not taking anti-sz meds. Stopped at time of last pregnancy, stating my sz meds caused sz and I need to get on another sz med. Pain worse with lying down and inspiration. TTP. No bruising, swelling, or wound. Radiates thru to back.  ?

## 2021-12-29 NOTE — ED Notes (Signed)
Pt to CT via stretcher, alert, NAD, calm, interactive.  ?

## 2021-12-29 NOTE — Progress Notes (Addendum)
Pharmacy Antibiotic Note ? ?[facility] is a 36 y.o. female admitted on 12/29/2021 with bacteremia.  Pharmacy has been consulted for Vancomycin dosing. ?Chest pain, ruling out endocarditis ? ?Plan: ?Vancomycin 2000 mg IV loading dose, then 1000mg  IV Q 12 hrs. Goal AUC 400-550. ?Expected AUC: 492 ?SCr used: 0.86  ?F/U cxs and clinical progress ?Monitor V/S, lab and levels as indicated ? ?Height: 5\' 8"  (172.7 cm) ?Weight: 100.7 kg (222 lb) ?IBW/kg (Calculated) : 63.9 ? ?Temp (24hrs), Avg:97.8 ?F (36.6 ?C), Min:97.8 ?F (36.6 ?C), Max:97.8 ?F (36.6 ?C) ? ?Recent Labs  ?Lab 12/29/21 ?1315  ?WBC 6.7  ?CREATININE 0.86  ?  ?Estimated Creatinine Clearance: 113.3 mL/min (by C-G formula based on SCr of 0.86 mg/dL).   ? ?Allergies  ?Allergen Reactions  ? Amoxicillin Rash  ? Penicillins Rash  ?  Has patient had a PCN reaction causing immediate rash, facial/tongue/throat swelling, SOB or lightheadedness with hypotension: Yes ?Has patient had a PCN reaction causing severe rash involving mucus membranes or skin necrosis: No ?Has patient had a PCN reaction that required hospitalization No ?Has patient had a PCN reaction occurring within the last 10 years: Yes ?If all of the above answers are "NO", then may proceed with Cephalosporin use. ?  ? ? ?Antimicrobials this admission: ?Vancomycin 5/8 >>  ?Cefepime 5/8 x 1 in ED ? ?Microbiology results: ?5/8 BCx: pending ? MRSA PCR:  ? ?Thank you for allowing pharmacy to be a part of this patient?s care. ? ?7/8, BS Pharm D, BCPS ?Clinical Pharmacist ?12/29/2021 4:16 PM ? ?

## 2021-12-29 NOTE — ED Notes (Signed)
Pt in CT.

## 2021-12-30 ENCOUNTER — Inpatient Hospital Stay (HOSPITAL_COMMUNITY): Payer: Medicaid Other

## 2021-12-30 ENCOUNTER — Other Ambulatory Visit (HOSPITAL_COMMUNITY): Payer: Medicaid Other

## 2021-12-30 DIAGNOSIS — R0602 Shortness of breath: Secondary | ICD-10-CM

## 2021-12-30 DIAGNOSIS — I38 Endocarditis, valve unspecified: Secondary | ICD-10-CM

## 2021-12-30 DIAGNOSIS — R918 Other nonspecific abnormal finding of lung field: Secondary | ICD-10-CM | POA: Diagnosis not present

## 2021-12-30 LAB — CBC
HCT: 38.7 % (ref 36.0–46.0)
Hemoglobin: 12.2 g/dL (ref 12.0–15.0)
MCH: 28.6 pg (ref 26.0–34.0)
MCHC: 31.5 g/dL (ref 30.0–36.0)
MCV: 90.8 fL (ref 80.0–100.0)
Platelets: 307 10*3/uL (ref 150–400)
RBC: 4.26 MIL/uL (ref 3.87–5.11)
RDW: 13.2 % (ref 11.5–15.5)
WBC: 6.4 10*3/uL (ref 4.0–10.5)
nRBC: 0 % (ref 0.0–0.2)

## 2021-12-30 LAB — COMPREHENSIVE METABOLIC PANEL
ALT: 21 U/L (ref 0–44)
AST: 29 U/L (ref 15–41)
Albumin: 3 g/dL — ABNORMAL LOW (ref 3.5–5.0)
Alkaline Phosphatase: 83 U/L (ref 38–126)
Anion gap: 7 (ref 5–15)
BUN: 9 mg/dL (ref 6–20)
CO2: 19 mmol/L — ABNORMAL LOW (ref 22–32)
Calcium: 8.6 mg/dL — ABNORMAL LOW (ref 8.9–10.3)
Chloride: 111 mmol/L (ref 98–111)
Creatinine, Ser: 0.85 mg/dL (ref 0.44–1.00)
GFR, Estimated: >60 mL/min (ref >60)
Glucose, Bld: 96 mg/dL (ref 70–99)
Potassium: 3.9 mmol/L (ref 3.5–5.1)
Sodium: 137 mmol/L (ref 135–145)
Total Bilirubin: 0.7 mg/dL (ref 0.3–1.2)
Total Protein: 7.6 g/dL (ref 6.5–8.1)

## 2021-12-30 LAB — ECHOCARDIOGRAM COMPLETE
AR max vel: 2.54 cm2
AV Area VTI: 2.8 cm2
AV Area mean vel: 2.62 cm2
AV Mean grad: 3 mmHg
AV Peak grad: 6.3 mmHg
Ao pk vel: 1.25 m/s
Area-P 1/2: 4.93 cm2
Calc EF: 59.2 %
Height: 68 in
MV VTI: 3.6 cm2
S' Lateral: 3 cm
Single Plane A2C EF: 61.7 %
Single Plane A4C EF: 55.4 %
Weight: 3552 oz

## 2021-12-30 LAB — GLUCOSE, CAPILLARY: Glucose-Capillary: 111 mg/dL — ABNORMAL HIGH (ref 70–99)

## 2021-12-30 LAB — CRYPTOCOCCAL ANTIGEN: Crypto Ag: NEGATIVE

## 2021-12-30 LAB — RAPID URINE DRUG SCREEN, HOSP PERFORMED
Amphetamines: NOT DETECTED
Barbiturates: NOT DETECTED
Benzodiazepines: NOT DETECTED
Cocaine: POSITIVE — AB
Opiates: POSITIVE — AB
Tetrahydrocannabinol: NOT DETECTED

## 2021-12-30 LAB — PREGNANCY, URINE: Preg Test, Ur: NEGATIVE

## 2021-12-30 LAB — SEDIMENTATION RATE: Sed Rate: 60 mm/hr — ABNORMAL HIGH (ref 0–22)

## 2021-12-30 LAB — PROCALCITONIN: Procalcitonin: 0.16 ng/mL

## 2021-12-30 MED ORDER — SODIUM CHLORIDE 0.9 % IV SOLN
2.0000 g | Freq: Three times a day (TID) | INTRAVENOUS | Status: DC
  Administered 2021-12-30 – 2021-12-31 (×4): 2 g via INTRAVENOUS
  Filled 2021-12-30 (×4): qty 12.5

## 2021-12-30 MED ORDER — LEVETIRACETAM 500 MG PO TABS
500.0000 mg | ORAL_TABLET | Freq: Two times a day (BID) | ORAL | Status: DC
  Administered 2021-12-30 – 2022-01-04 (×11): 500 mg via ORAL
  Filled 2021-12-30 (×11): qty 1

## 2021-12-30 MED ORDER — IOHEXOL 350 MG/ML SOLN
75.0000 mL | Freq: Once | INTRAVENOUS | Status: AC | PRN
  Administered 2021-12-30: 50 mL via INTRAVENOUS

## 2021-12-30 NOTE — Progress Notes (Signed)
*  PRELIMINARY RESULTS* ?Echocardiogram ?2D Echocardiogram has been performed. ? ?Carolyne Fiscal ?12/30/2021, 9:28 AM ?

## 2021-12-30 NOTE — Plan of Care (Signed)

## 2021-12-30 NOTE — Progress Notes (Signed)
?  Transition of Care (TOC) Screening Note ? ? ?Patient Details  ?Name: [facility] ?Date of Birth: [birth year] ? ? ?Transition of Care (TOC) CM/SW Contact:    ?Villa Herb, LCSWA ?Phone Number: ?12/30/2021, 10:25 AM ? ? ? ?Transition of Care Department [facility]) has reviewed patient and no TOC needs have been identified at this time. We will continue to monitor patient advancement through interdisciplinary progression rounds. If new patient transition needs arise, please place a TOC consult. ?  ?

## 2021-12-30 NOTE — Progress Notes (Signed)
Pt has arrived to unit from ED. Yellow mews due to heart rate , pt stable, no concerns. Provider Thomes Dinning and charge RN aware of yellow mews. Will continue to monitor, call bell within reach.  ? 12/29/21 2017  ?Assess: MEWS Score  ?Temp 99.1 ?F (37.3 ?C)  ?BP 132/88  ?Pulse Rate (!) 117  ?Resp 18  ?Level of Consciousness Alert  ?SpO2 93 %  ?O2 Device Room Air  ?Assess: MEWS Score  ?MEWS Temp 0  ?MEWS Systolic 0  ?MEWS Pulse 2  ?MEWS RR 0  ?MEWS LOC 0  ?MEWS Score 2  ?MEWS Score Color Yellow  ?Assess: if the MEWS score is Yellow or Red  ?Were vital signs taken at a resting state? Yes  ?Focused Assessment No change from prior assessment  ?Early Detection of Sepsis Score *See Row Information* Low  ?MEWS guidelines implemented *See Row Information* Yes  ?Treat  ?MEWS Interventions Escalated (See documentation below)  ?Pain Scale 0-10  ?Pain Score 9  ?Pain Type Acute pain  ?Pain Location Chest  ?Pain Orientation Mid  ?Pain Descriptors / Indicators Aching  ?Pain Frequency Constant  ?Pain Onset On-going  ?Patients Stated Pain Goal 0  ?Take Vital Signs  ?Increase Vital Sign Frequency  Yellow: Q 2hr X 2 then Q 4hr X 2, if remains yellow, continue Q 4hrs  ?Escalate  ?MEWS: Escalate Yellow: discuss with charge nurse/RN and consider discussing with provider and RRT  ?Notify: Charge Nurse/RN  ?Name of Charge Nurse/RN Notified Kristi RN  ?Date Charge Nurse/RN Notified 12/29/21  ?Time Charge Nurse/RN Notified 2030  ?Notify: Provider  ?Provider Name/Title Thomes Dinning MD  ?Date Provider Notified 11/29/21  ?Time Provider Notified 2024  ?Method of Notification Page  ?Notification Reason Other (Comment) ?(Yellow Mews, pt just arrived to unit from ED.)  ?Provider response Evaluate remotely  ?Date of Provider Response 11/29/21  ? ? ?

## 2021-12-30 NOTE — Consult Note (Signed)
Pt seen and examined and full note to follow but awaiting preliminary lab results ? ?If no evidence of sepsis or septic emboli could discharge to outpt f/u with PET scan but would not order this until labs are back. ? ?Sandrea Hughs, MD ?Pulmonary and Critical Care Medicine ?[facility] ?Cell [phone]  ? ?After 7:00 pm call Elink  [phone]  ?

## 2021-12-30 NOTE — Progress Notes (Signed)
?Progress Note ? ?PatientAndrina [facility] GPQ:[id number] DOB: 09-17-1985  ?DOA: 12/29/2021  DOS: 12/30/2021  ?  ?Brief hospital course: ?[facility] is a 36 y.o. female with a history of seizure disorder, depression, iron deficiency anemia, cocaine use remotely who presented to the ED 5/8 with mid-chest pain after a trip and fall. She reported recent fatigue, poor sleep. CT scan of the chest showed multiple irregular densities noted throughout both lungs many of which are cavitary and the largest measures approximately 3.3 cm.  She has extensive mediastinal adenopathy as well as enlarged adenopathy in the porta hepatis region of the abdomen.  Her densities may represent septic emboli or multifocal infection they are concerning for metastatic disease given the extensive adenopathy as described CT scan of the abdomen and pelvis is recommended for further evaluation per radiology. ? ?IV antibiotics started, quantiferon gold and blood cultures sent and the patient was admitted for further work up with pulmonary consultation. ? ?Assessment and Plan: ?Bilateral pulmonary densities, cavitary lesions, extensive mediastinal adenopathy. Pathologically enlarged upper abdominal lymph nodes up to 4cm. Nonspecific cervical lymphadenopathy. Splenomegaly, hepatomegaly. No convincing evidence of Lemierre's disease on CTA of the neck. There is paranasal sinus fluid, though pt doesn't have signs or symptoms of acute sinusitis at this time. Pt has some nonspecific constitutional symptoms, though fever curve (Tmax 99.1?F) and WBC (normal at 6.4k) are reassuring.  ?- DDx to consider includes septic emboli and metastatic malignancy of unclear primary. Also need to consider fungal infection, auto-immune, and arboviruses.  ?- Will monitor blood cultures. Check PCT. ?- Echocardiogram is largely unremarkable, including no vegetations noted on TTE. ?- Pt has history of incarceration, will follow up quantiferon, PPD. Continue airborne precautions  due to concern for TB. ?- Appreciate pulmonary consultation. ?if bronch biopsy would be helpful pending quantiferon. Will need PET-CT after discharge and trial of empiric antibiotics.  ?- Note pt was evaluated in ED 11/27/2021 complaining of tick bite. This broadens differential somewhat, though there are not the typical features of tick-borne illnesses, specifically no rash. There is nonspecific liver and spleen enlargement, though no thrombocytopenia and LFTs are wnl. Check RMWF, lyme, ehrlichia titers.  ? ?History of cocaine use: No evidence of IVDU at this time on exam. HIV is nonreactive. ?- Check UDS ? ?Chest pain: Due to trip/fall. No significant traumatic complications on CT's. Cardiac enzyme and ECG not suggestive of cardiac etiology.  ? ?Obesity: Estimated body mass index is 33.75 kg/m? as calculated from the following: ?  Height as of this encounter: 5\' 8"  (1.727 m). ?  Weight as of this encounter: 100.7 kg. ?  ?Subjective: Pt tired this morning after being up all night in the ED. She confirms she's had weight loss, but says it's only been 1 pound. Denies significant cough or sick contacts. Denies any wounds anywhere. FH only +prostate CA in her father diagnosed later in life. ? ?Objective: ?Vitals:  ? 12/29/21 2017 12/30/21 0109 12/30/21 0530 12/30/21 1043  ?BP: 132/88 111/78 (!) 124/96 105/71  ?Pulse: (!) 117 (!) 112 99 89  ?Resp: 18 19 19 18   ?Temp: 99.1 ?F (37.3 ?C) 97.7 ?F (36.5 ?C) 97.8 ?F (36.6 ?C) 97.7 ?F (36.5 ?C)  ?TempSrc: Oral Oral Oral Oral  ?SpO2: 93% 97% 98% 98%  ?Weight:      ?Height:      ? ?Gen: Obese 36 y.o. female in no distress ?Pulm: Normal respiratory effort. No crackles or wheezes. ?CV: Regular tachycardia without significant murmur, rub, or gallop. No JVD,  no dependent edema. ?GI: Abdomen soft, non-tender, non-distended, with normoactive bowel sounds.  ?Ext: Warm, no deformities ?Skin: No rashes, lesions or ulcers on visualized skin. Limited exam due to patient's wishes to stay  covered ?Neuro: Alert and oriented. No meningismus. Cranial nerves intact grossly. On limited exam, patient moves all extremities. ?Psych: Judgement and insight appear fair. Affect may be withdrawn vs. being tired.  ? ?Data Personally reviewed: ?CBC: ?Recent Labs  ?Lab 12/29/21 ?1315 12/30/21 ?0543  ?WBC 6.7 6.4  ?HGB 13.5 12.2  ?HCT 43.4 38.7  ?MCV 90.6 90.8  ?PLT 354 307  ? ?Basic Metabolic Panel: ?Recent Labs  ?Lab 12/29/21 ?1315 12/30/21 ?0543  ?NA 137 137  ?K 4.0 3.9  ?CL 105 111  ?CO2 24 19*  ?GLUCOSE 106* 96  ?BUN 9 9  ?CREATININE 0.86 0.85  ?CALCIUM 9.3 8.6*  ? ?GFR: ?Estimated Creatinine Clearance: 114.6 mL/min (by C-G formula based on SCr of 0.85 mg/dL). ?Liver Function Tests: ?Recent Labs  ?Lab 12/30/21 ?0543  ?AST 29  ?ALT 21  ?ALKPHOS 83  ?BILITOT 0.7  ?PROT 7.6  ?ALBUMIN 3.0*  ? ?No results for input(s): LIPASE, AMYLASE in the last 168 hours. ?No results for input(s): AMMONIA in the last 168 hours. ?Coagulation Profile: ?No results for input(s): INR, PROTIME in the last 168 hours. ?Cardiac Enzymes: ?No results for input(s): CKTOTAL, CKMB, CKMBINDEX, TROPONINI in the last 168 hours. ?BNP (last 3 results) ?No results for input(s): PROBNP in the last 8760 hours. ?HbA1C: ?No results for input(s): HGBA1C in the last 72 hours. ?CBG: ?Recent Labs  ?Lab 12/30/21 ?0716  ?GLUCAP 111*  ? ?Lipid Profile: ?No results for input(s): CHOL, HDL, LDLCALC, TRIG, CHOLHDL, LDLDIRECT in the last 72 hours. ?Thyroid Function Tests: ?No results for input(s): TSH, T4TOTAL, FREET4, T3FREE, THYROIDAB in the last 72 hours. ?Anemia Panel: ?No results for input(s): VITAMINB12, FOLATE, FERRITIN, TIBC, IRON, RETICCTPCT in the last 72 hours. ?Urine analysis: ?   ?Component Value Date/Time  ? COLORURINE YELLOW 09/16/2019 1426  ? APPEARANCEUR HAZY (A) 09/16/2019 1426  ? APPEARANCEUR Clear 01/23/2019 1503  ? LABSPEC 1.015 09/16/2019 1426  ? PHURINE 5.0 09/16/2019 1426  ? GLUCOSEU NEGATIVE 09/16/2019 1426  ? HGBUR NEGATIVE 09/16/2019 1426   ? BILIRUBINUR NEGATIVE 09/16/2019 1426  ? BILIRUBINUR Negative 01/23/2019 1503  ? KETONESUR NEGATIVE 09/16/2019 1426  ? PROTEINUR NEGATIVE 09/16/2019 1426  ? UROBILINOGEN 4.0 (H) 08/15/2014 1816  ? NITRITE NEGATIVE 09/16/2019 1426  ? LEUKOCYTESUR TRACE (A) 09/16/2019 1426  ? ?Recent Results (from the past 240 hour(s))  ?Blood culture (routine x 2)     Status: None (Preliminary result)  ? Collection Time: 12/29/21  4:22 PM  ? Specimen: Right Antecubital; Blood  ?Result Value Ref Range Status  ? Specimen Description RIGHT ANTECUBITAL  Final  ? Special Requests   Final  ?  BOTTLES DRAWN AEROBIC AND ANAEROBIC Blood Culture adequate volume  ? Culture   Final  ?  NO GROWTH < 24 HOURS ?Performed at [facility], [address], [place], [place] [address] ?  ? Report Status PENDING  Incomplete  ?Blood culture (routine x 2)     Status: None (Preliminary result)  ? Collection Time: 12/29/21  4:24 PM  ? Specimen: BLOOD LEFT HAND  ?Result Value Ref Range Status  ? Specimen Description BLOOD LEFT HAND  Final  ? Special Requests   Final  ?  BOTTLES DRAWN AEROBIC AND ANAEROBIC Blood Culture results may not be optimal due to an inadequate volume of blood received  in culture bottles  ? Culture   Final  ?  NO GROWTH < 24 HOURS ?Performed at [facility], [address] [place], [place] [address] ?  ? Report Status PENDING  Incomplete  ?   ?DG Chest 2 View ? ?Result Date: 12/29/2021 ?CLINICAL DATA:  Chest pain.  Shortness of breath.  Fall yesterday. EXAM: CHEST - 2 VIEW COMPARISON:  10/09/2018 FINDINGS: Normal heart size. Lung volumes are low. No signs of pleural effusion or edema. Bilateral, multifocal airspace opacities are identified involving the right midlung, left midlung and left base. The visualized skeletal structures appear intact. IMPRESSION: Bilateral multifocal airspace opacities compatible with multifocal infection. Electronically Signed   By: Signa Kellaylor  Stroud M.D.   On: 12/29/2021 13:37  ? ?CT ANGIO NECK W OR WO  CONTRAST ? ?Result Date: 12/30/2021 ?CLINICAL DATA:  Chest pain, shortness of breath, possible septic emboli seen on CT chest. Concern for Lemierre's disease. EXAM: CT ANGIOGRAPHY NECK TECHNIQUE: Multidetector CT imagin

## 2021-12-31 ENCOUNTER — Inpatient Hospital Stay (HOSPITAL_COMMUNITY): Payer: Medicaid Other

## 2021-12-31 DIAGNOSIS — R0602 Shortness of breath: Secondary | ICD-10-CM | POA: Diagnosis not present

## 2021-12-31 DIAGNOSIS — J984 Other disorders of lung: Secondary | ICD-10-CM

## 2021-12-31 DIAGNOSIS — R918 Other nonspecific abnormal finding of lung field: Secondary | ICD-10-CM | POA: Diagnosis not present

## 2021-12-31 DIAGNOSIS — E669 Obesity, unspecified: Secondary | ICD-10-CM | POA: Diagnosis not present

## 2021-12-31 DIAGNOSIS — K219 Gastro-esophageal reflux disease without esophagitis: Secondary | ICD-10-CM

## 2021-12-31 LAB — ANCA PROFILE
Anti-MPO Antibodies: <0.2 units (ref 0.0–0.9)
Anti-PR3 Antibodies: <0.2 units (ref 0.0–0.9)
Atypical P-ANCA titer: <1:20 {titer}
C-ANCA: <1:20 {titer}
P-ANCA: <1:20 {titer}

## 2021-12-31 LAB — BLOOD CULTURE ID PANEL (REFLEXED) - BCID2

## 2021-12-31 LAB — RHEUMATOID FACTOR: Rheumatoid fact SerPl-aCnc: 18.7 IU/mL — ABNORMAL HIGH (ref <14.0)

## 2021-12-31 LAB — LYME DISEASE SEROLOGY W/REFLEX: Lyme Total Antibody EIA: NEGATIVE

## 2021-12-31 LAB — EHRLICHIA ANTIBODY PANEL
E chaffeensis (HGE) Ab, IgG: NEGATIVE
E chaffeensis (HGE) Ab, IgM: NEGATIVE
E. Chaffeensis (HME) IgM Titer: NEGATIVE
E.Chaffeensis (HME) IgG: NEGATIVE

## 2021-12-31 LAB — GLUCOSE, CAPILLARY: Glucose-Capillary: 84 mg/dL (ref 70–99)

## 2021-12-31 LAB — PROCALCITONIN: Procalcitonin: 0.12 ng/mL

## 2021-12-31 MED ORDER — PANTOPRAZOLE SODIUM 40 MG PO TBEC
40.0000 mg | DELAYED_RELEASE_TABLET | Freq: Every day | ORAL | Status: DC
  Administered 2021-12-31 – 2022-01-03 (×4): 40 mg via ORAL
  Filled 2021-12-31 (×4): qty 1

## 2021-12-31 MED ORDER — SULFAMETHOXAZOLE-TRIMETHOPRIM 800-160 MG PO TABS
1.0000 | ORAL_TABLET | Freq: Two times a day (BID) | ORAL | Status: DC
  Administered 2021-12-31 – 2022-01-04 (×9): 1 via ORAL
  Filled 2021-12-31 (×9): qty 1

## 2021-12-31 NOTE — Progress Notes (Signed)
?Progress Note ? ?PatientKynzie Sullivan SAY:[id number] DOB: 09-20-1985  ?DOA: 12/29/2021  DOS: 12/31/2021  ?  ?Brief hospital course: ?Chelsey Sullivan is a 36 y.o. female with a history of seizure disorder, depression, iron deficiency anemia, cocaine use remotely who presented to the ED 5/8 with mid-chest pain after a trip and fall. She reported recent fatigue, poor sleep. CT scan of the chest showed multiple irregular densities noted throughout both lungs many of which are cavitary and the largest measures approximately 3.3 cm.  She has extensive mediastinal adenopathy as well as enlarged adenopathy in the porta hepatis region of the abdomen.  Her densities may represent septic emboli or multifocal infection they are concerning for metastatic disease given the extensive adenopathy as described CT scan of the abdomen and pelvis is recommended for further evaluation per radiology. ? ?IV antibiotics started, quantiferon gold and blood cultures sent and the patient was admitted for further work up with pulmonary consultation. ? ?Assessment and Plan: ?Bilateral pulmonary densities/fever: cavitary lesions, extensive mediastinal adenopathy. Pathologically enlarged upper abdominal lymph nodes up to 4cm. Nonspecific cervical lymphadenopathy. Splenomegaly, hepatomegaly. No convincing evidence of Lemierre's disease on CTA of the neck. There is paranasal sinus fluid, though pt doesn't have signs or symptoms of acute sinusitis at this time. Pt has some nonspecific constitutional symptoms, though fever curve (Tmax 99.1?F) and WBC (normal at 6.4k) are reassuring.  ?-Still having low-grade temp. ?- DDx to consider includes septic emboli and metastatic malignancy of unclear primary. Also need to consider fungal infection, auto-immune, and arboviruses.  ?- Will monitor blood cultures. Check PCT. ?- Echocardiogram is largely unremarkable, including no vegetations noted on TTE. ?- Pt has history of incarceration, will follow up quantiferon,  PPD. Continue airborne precautions due to concern for TB. ?- Appreciate pulmonary consultation. ?if bronch biopsy would be helpful pending quantiferon. Will need PET-CT after discharge and trial of empiric antibiotics.  ?-There is concern for vasculitis/collagen disorder ?-Following recommendations by pulmonologist head MRI has been ordered ?-Will get physical therapy evaluation. ?-Transition antibiotics to oral Bactrim and follow response. ?- Note pt was evaluated in ED 11/27/2021 complaining of tick bite. This broadens differential somewhat, though there are not the typical features of tick-borne illnesses, specifically no rash. There is nonspecific liver and spleen enlargement, though no thrombocytopenia and LFTs are wnl. Check RMWF, lyme, ehrlichia titers.  ?-Continue to follow pending cultures results. ? ?History of cocaine use: No evidence of IVDU at this time on exam.  ?-HIV is nonreactive. ?-UDS positive for cocaine and opiates. ? ?Chest pain: Due to trip/fall. No significant traumatic complications on CT's.  ?-Cardiac enzymes negative, EKG, 2D echo and telemetry reassuring and ruling out cardiac etiology. ?-Will DC telemetry.   ? ?Obesity: Estimated body mass index is 34.09 kg/m? as calculated from the following: ?  Height as of this encounter: 5\' 8"  (1.727 m). ?  Weight as of this encounter: 101.7 kg. ?-Low calorie diet, portion control and increase physical activity discussed with patient. ? ?GERD ?-start PPI ?  ?Subjective:  ?No nausea or vomiting; reports still ongoing intermittent chin discomfort in her lateral aspect and having associated general malaise.  She feels weak and tired. ? ?Objective: ?Vitals:  ? 12/30/21 1540 12/30/21 2201 12/31/21 0429 12/31/21 1447  ?BP: 109/69 (!) 134/95 115/72 116/81  ?Pulse: 91 97 88 86  ?Resp: 20 18 18 18   ?Temp: (!) 97.4 ?F (36.3 ?C) 100.1 ?F (37.8 ?C) 98.2 ?F (36.8 ?C) 98.3 ?F (36.8 ?C)  ?TempSrc: Oral   Oral  ?  SpO2: 93% 99% 99% 96%  ?Weight:   101.7 kg   ?Height:       ? ?General exam: Alert, awake, oriented x 3; obese, complaining of intermittent chest discomfort and general malaise.  Low-grade temperature appreciated overnight. ?Respiratory system: Normal respiratory effort; no using accessory muscles. ?Cardiovascular system:RRR. No murmurs, rubs, gallops.  No JVD ?Gastrointestinal system: Abdomen is obese, nondistended, soft and nontender. No organomegaly or masses felt. Normal bowel sounds heard. ?Central nervous system: Alert and oriented. No focal neurological deficits. ?Extremities: No cyanosis or clubbing. ?Skin: No rashes, no petechiae. ?Psychiatry: Judgement and insight appear normal.  Flat affect. ? ? ?Data Personally reviewed: ?CBC: ?Recent Labs  ?Lab 12/29/21 ?1315 12/30/21 ?0543  ?WBC 6.7 6.4  ?HGB 13.5 12.2  ?HCT 43.4 38.7  ?MCV 90.6 90.8  ?PLT 354 307  ? ?Basic Metabolic Panel: ?Recent Labs  ?Lab 12/29/21 ?1315 12/30/21 ?0543  ?NA 137 137  ?K 4.0 3.9  ?CL 105 111  ?CO2 24 19*  ?GLUCOSE 106* 96  ?BUN 9 9  ?CREATININE 0.86 0.85  ?CALCIUM 9.3 8.6*  ? ?GFR: ?Estimated Creatinine Clearance: 115.2 mL/min (by C-G formula based on SCr of 0.85 mg/dL). ? ?Liver Function Tests: ?Recent Labs  ?Lab 12/30/21 ?0543  ?AST 29  ?ALT 21  ?ALKPHOS 83  ?BILITOT 0.7  ?PROT 7.6  ?ALBUMIN 3.0*  ? ?CBG: ?Recent Labs  ?Lab 12/30/21 ?0716 12/31/21 ?0733  ?GLUCAP 111* 84  ? ? ?Urine analysis: ?   ?Component Value Date/Time  ? COLORURINE YELLOW 09/16/2019 1426  ? APPEARANCEUR HAZY (A) 09/16/2019 1426  ? APPEARANCEUR Clear 01/23/2019 1503  ? LABSPEC 1.015 09/16/2019 1426  ? PHURINE 5.0 09/16/2019 1426  ? GLUCOSEU NEGATIVE 09/16/2019 1426  ? HGBUR NEGATIVE 09/16/2019 1426  ? BILIRUBINUR NEGATIVE 09/16/2019 1426  ? BILIRUBINUR Negative 01/23/2019 1503  ? KETONESUR NEGATIVE 09/16/2019 1426  ? PROTEINUR NEGATIVE 09/16/2019 1426  ? UROBILINOGEN 4.0 (H) 08/15/2014 1816  ? NITRITE NEGATIVE 09/16/2019 1426  ? LEUKOCYTESUR TRACE (A) 09/16/2019 1426  ? ?Recent Results (from the past 240 hour(s))   ?Blood culture (routine x 2)     Status: None (Preliminary result)  ? Collection Time: 12/29/21  4:22 PM  ? Specimen: Right Antecubital; Blood  ?Result Value Ref Range Status  ? Specimen Description   Final  ?  RIGHT ANTECUBITAL ?Performed at [facility], [address] [place], [place] [address] ?  ? Special Requests   Final  ?  BOTTLES DRAWN AEROBIC AND ANAEROBIC Blood Culture adequate volume ?Performed at [facility], [address] [place], [place] [address] ?  ? Culture  Setup Time   Final  ?  GRAM POSITIVE COCCI anaerobic bottle Gram Stain Report Called to,Read Back By and Verified With: graves, b @0530  by matthews, b 5.10.2023 ?Organism ID to follow ?CRITICAL RESULT CALLED TO, READ BACK BY AND VERIFIED WITH: PHARMD ELVIRA M. [id number] FCP ?Performed at [facility], [address] [place], [place] [address] ?  ? Culture GRAM POSITIVE COCCI  Final  ? Report Status PENDING  Incomplete  ?Blood Culture ID Panel (Reflexed)     Status: Abnormal  ? Collection Time: 12/29/21  4:22 PM  ?Result Value Ref Range Status  ? Enterococcus faecalis NOT DETECTED NOT DETECTED Final  ? Enterococcus Faecium NOT DETECTED NOT DETECTED Final  ? Listeria monocytogenes NOT DETECTED NOT DETECTED Final  ? Staphylococcus species DETECTED (A) NOT DETECTED Final  ?  Comment: CRITICAL RESULT CALLED TO, READ BACK BY AND  VERIFIED WITH: ?PHARMD ELVIRA M. [phone] FCP ?  ? Staphylococcus aureus (BCID) NOT DETECTED NOT DETECTED Final  ? Staphylococcus epidermidis NOT DETECTED NOT DETECTED Final  ? Staphylococcus lugdunensis NOT DETECTED NOT DETECTED Final  ? Streptococcus species NOT DETECTED NOT DETECTED Final  ? Streptococcus agalactiae NOT DETECTED NOT DETECTED Final  ? Streptococcus pneumoniae NOT DETECTED NOT DETECTED Final  ? Streptococcus pyogenes NOT DETECTED NOT DETECTED Final  ? A.calcoaceticus-baumannii NOT DETECTED NOT DETECTED Final  ? Bacteroides fragilis NOT DETECTED NOT DETECTED Final  ? Enterobacterales NOT  DETECTED NOT DETECTED Final  ? Enterobacter cloacae complex NOT DETECTED NOT DETECTED Final  ? Escherichia coli NOT DETECTED NOT DETECTED Final  ? Klebsiella aerogenes NOT DETECTED NOT DETECTED Final  ? Eduardo Osier

## 2021-12-31 NOTE — Progress Notes (Addendum)
PHARMACY - PHYSICIAN COMMUNICATION ?CRITICAL VALUE ALERT - BLOOD CULTURE IDENTIFICATION (BCID) ? ?Chelsey Sullivan is a 36 y.o. female who presented to [facility] on 12/29/2021 admitted with bilateral pulmonary densities, cavitary lesions, extensive mediastinal adenopathy ? ?Assessment:  1 out of 4 blood cultures growing staph spp, no resistance detected. Possibly MSSA or Staph epi, likely a contaminant.  ? ?Name of physician (or Provider) Contacted: Dr. Gwenlyn Perking ? ?Current antibiotics: Bactrim (transitioned from cefepime/vanc) ? ?Changes to prescribed antibiotics recommended:  ?No changes at this time. Continue Bactrim ? ?Results for orders placed or performed during the hospital encounter of 12/29/21  ?Blood Culture ID Panel (Reflexed) (Collected: 12/29/2021  4:22 PM)  ?Result Value Ref Range  ? Enterococcus faecalis NOT DETECTED NOT DETECTED  ? Enterococcus Faecium NOT DETECTED NOT DETECTED  ? Listeria monocytogenes NOT DETECTED NOT DETECTED  ? Staphylococcus species DETECTED (A) NOT DETECTED  ? Staphylococcus aureus (BCID) NOT DETECTED NOT DETECTED  ? Staphylococcus epidermidis NOT DETECTED NOT DETECTED  ? Staphylococcus lugdunensis NOT DETECTED NOT DETECTED  ? Streptococcus species NOT DETECTED NOT DETECTED  ? Streptococcus agalactiae NOT DETECTED NOT DETECTED  ? Streptococcus pneumoniae NOT DETECTED NOT DETECTED  ? Streptococcus pyogenes NOT DETECTED NOT DETECTED  ? A.calcoaceticus-baumannii NOT DETECTED NOT DETECTED  ? Bacteroides fragilis NOT DETECTED NOT DETECTED  ? Enterobacterales NOT DETECTED NOT DETECTED  ? Enterobacter cloacae complex NOT DETECTED NOT DETECTED  ? Escherichia coli NOT DETECTED NOT DETECTED  ? Klebsiella aerogenes NOT DETECTED NOT DETECTED  ? Klebsiella oxytoca NOT DETECTED NOT DETECTED  ? Klebsiella pneumoniae NOT DETECTED NOT DETECTED  ? Proteus species NOT DETECTED NOT DETECTED  ? Salmonella species NOT DETECTED NOT DETECTED  ? Serratia marcescens NOT DETECTED NOT DETECTED  ? Haemophilus  influenzae NOT DETECTED NOT DETECTED  ? Neisseria meningitidis NOT DETECTED NOT DETECTED  ? Pseudomonas aeruginosa NOT DETECTED NOT DETECTED  ? Stenotrophomonas maltophilia NOT DETECTED NOT DETECTED  ? Candida albicans NOT DETECTED NOT DETECTED  ? Candida auris NOT DETECTED NOT DETECTED  ? Candida glabrata NOT DETECTED NOT DETECTED  ? Candida krusei NOT DETECTED NOT DETECTED  ? Candida parapsilosis NOT DETECTED NOT DETECTED  ? Candida tropicalis NOT DETECTED NOT DETECTED  ? Cryptococcus neoformans/gattii NOT DETECTED NOT DETECTED  ? ? ?[name], PharmD ?Clinical Pharmacist ?12/31/2021 2:27 PM ? ? ? ? ? ? ?

## 2021-12-31 NOTE — Consult Note (Addendum)
? ?NAME:  Chelsey Sullivan, MRN:  [id number], DOB:  1986/02/12, LOS: 2 ?ADMISSION DATE:  12/29/2021, CONSULTATION DATE:  12/31/21  ?REFERRING MD:  [facility], CHIEF COMPLAINT:  chest pain   ? ?History of Present Illness:  ?16 yobf active smoker / works as Psychologist, counselling for cognitively impaired,  with a history of seizure disorder, depression, iron deficiency anemia, cocaine use remotely who presented to the ED 5/8 with mid-chest pain after a trip and fall. She reported recent fatigue, poor sleep. CT scan of the chest showed multiple irregular densities noted throughout both lungs many of which are cavitary and the largest measures approximately 3.3 cm.  She has extensive mediastinal adenopathy as well as enlarged adenopathy in the porta hepatis region of the abdomen.   ? ?Additional hx significant for  2weeks fatigue with teeth aching lower jaw bilaterally, remote wisdom tooth extraction but not recent dental care.  Also same time noted mild hoarseness and some chills but no fever.  No joint aches, dysphagia/ unintended wt loss abd pain or nausea or sob.   ? ?Was not having any cp or shin pain until says stumbled and fell on her chest one day PTA  ?No h/o IV drug use per pt  ? ?  ? ?Significant Hospital Events: ?Including procedures, antibiotic start and stop dates in addition to other pertinent events   ?CTa  01/29/22  multiple cavitary nodule, adenopathy ?CTabd 12/29/21 There are pathologically enlarged lymph nodes in the upper abdomen  the retroperitoneum and adjacent to porta hepatis measuring up to cm in maximum diameter. This may suggest inflammatory or neoplastic process. Enlarged liver without focal abnormalities.  Enlarged spleen. ?UDS  5/9 pos opiates/ cocaine ?ESR   5/9 = 60 and Alb 3.0 with nl lfts ?Echo 5/9 wnl  ? ? ? ? ?Scheduled Meds: ? enoxaparin (LOVENOX) injection  40 mg Subcutaneous Q24H  ? levETIRAcetam  500 mg Oral BID  ? pantoprazole  40 mg Oral Daily  ? sodium chloride flush  3 mL Intravenous Q12H  ?  sulfamethoxazole-trimethoprim  1 tablet Oral Q12H  ? tuberculin  5 Units Intradermal Once  ? ?Continuous Infusions: ? sodium chloride 100 mL/hr at 12/31/21 1357  ? ?PRN Meds:.acetaminophen **OR** acetaminophen, albuterol, HYDROmorphone (DILAUDID) injection, ondansetron **OR** ondansetron (ZOFRAN) IV, oxyCODONE, polyethylene glycol, traZODone  ? ? ?Interim History / Subjective:  ?Now also gives h/o profound anorexia for weeks PTA and says cp did not start with fall but 5-6 h after she fell walking down a ramp at her house - denies losing balance and only hurt on lateral aspect of R leg immediately fell and not hurt/hurting anywhere else from (did not apparently try to break her fall with hand/arms before landed on chest ? Accuracy of this hx)  ? ?Objective   ?Blood pressure 116/81, pulse 86, temperature 98.3 ?F (36.8 ?C), temperature source Oral, resp. rate 18, height _0  (1.727 m), weight 101.7 kg, SpO2 96 %, unknown if currently breastfeeding. ?   ?   ? ?Intake/Output Summary (Last 24 hours) at 12/31/2021 1450 ?Last data filed at 12/31/2021 1256 ?Gross per 24 hour  ?Intake 2859.98 ml  ?Output 600 ml  ?Net 2259.98 ml  ? ?Filed Weights  ? 12/29/21 1207 12/31/21 0429  ?Weight: 100.7 kg 101.7 kg  ? ? ?Examination: ?Tmax 100.1  ?General:  middle aged bf, somewhat evasive with questions to specific parts of hx / injury ?HENT: orophx clear  ?Lungs: clear to A bilaterally  ?Cardiovascular: RRR no s3 ?Abdomen:  soft/ no gross HSM ?Extremities: warm s clubbing /edema ?Neuro: ? Somewhat slowed mentation  ? ? ? ?Assessment & Plan:  ?1)  Multiple pulmonary cavities with HSM and pathologic adenopathy assoc with sev weeks of FTT s assoc fever/chills/sweats point more to metastatic dz or collagen vasc dz like  WG(or other anca pos vasculitis) than infectious source so once infection is ruled out would d/c to outpt f/u with PET first step the probably bx most accessible site per IR vs FOB. ? ?2) anorexia/ nausea/ ?slowed mentation  worrisome for GI malignancy or cns mets ? Needs MRI or at least contrast head CT before d/c noting has already had a lot of contrast dye so probably would favor MRI here but defer to [facility] and also rec we get her up walking to see if she has any gait issues that may have caused her to fall in the first placde  ? ?Best Practice (right click and "Reselect all SmartList Selections" daily)  ? ?Per triad  ?  ? ?Labs   ?CBC: ?Recent Labs  ?Lab 12/29/21 ?1315 12/30/21 ?0543  ?WBC 6.7 6.4  ?HGB 13.5 12.2  ?HCT 43.4 38.7  ?MCV 90.6 90.8  ?PLT 354 307  ? ? ?Basic Metabolic Panel: ?Recent Labs  ?Lab 12/29/21 ?1315 12/30/21 ?0543  ?NA 137 137  ?K 4.0 3.9  ?CL 105 111  ?CO2 24 19*  ?GLUCOSE 106* 96  ?BUN 9 9  ?CREATININE 0.86 0.85  ?CALCIUM 9.3 8.6*  ? ?GFR: ?Estimated Creatinine Clearance: 115.2 mL/min (by C-G formula based on SCr of 0.85 mg/dL). ?Recent Labs  ?Lab 12/29/21 ?1315 12/29/21 ?1622 12/30/21 ?0543 12/30/21 ?1326 12/31/21 ?9767  ?PROCALCITON  --   --   --  0.16 0.12  ?WBC 6.7  --  6.4  --   --   ?LATICACIDVEN  --  1.8  --   --   --   ? ? ?Liver Function Tests: ?Recent Labs  ?Lab 12/30/21 ?0543  ?AST 29  ?ALT 21  ?ALKPHOS 83  ?BILITOT 0.7  ?PROT 7.6  ?ALBUMIN 3.0*  ? ?No results for input(s): LIPASE, AMYLASE in the last 168 hours. ?No results for input(s): AMMONIA in the last 168 hours. ? ?ABG ?No results found for: PHART, PCO2ART, PO2ART, HCO3, TCO2, ACIDBASEDEF, O2SAT  ? ?Coagulation Profile: ?No results for input(s): INR, PROTIME in the last 168 hours. ? ?Cardiac Enzymes: ?No results for input(s): CKTOTAL, CKMB, CKMBINDEX, TROPONINI in the last 168 hours. ? ?HbA1C: ?Hgb A1c MFr Bld  ?Date/Time Value Ref Range Status  ?11/03/2016 12:10 AM 5.1 4.8 - 5.6 % Final  ?  Comment:  ?  (NOTE) ?        Pre-diabetes: 5.7 - 6.4 ?        Diabetes: >6.4 ?        Glycemic control for adults with diabetes: <7.0 ?  ? ? ?CBG: ?Recent Labs  ?Lab 12/30/21 ?0716 12/31/21 ?0733  ?GLUCAP 111* 84  ? ? ?  ? ?Past Medical History:  ?She,   has a past medical history of Cocaine abuse ([place]), Depression, and Epilepsy ([place]).  ? ?Surgical History:  ? ?Past Surgical History:  ?Procedure Laterality Date  ? HERNIA REPAIR    ? umbilical hernia repair x 2.  ?  ? ?Social History:  ? reports that she has been smoking cigarettes. She has been smoking an average of 1 pack per day. She has never used smokeless tobacco. She reports that she does not currently use alcohol.  She reports that she does not currently use drugs after having used the following drugs: Cocaine.  ? ?Family History:  ?Her family history includes Diabetes in an other family member; Epilepsy in an other family member; Hypertension in her maternal grandmother and another family member; Kidney disease in her maternal grandmother; Seizures in her sister.  ? ?Allergies ?Allergies  ?Allergen Reactions  ? Amoxicillin Rash  ? Penicillins Rash  ?  Has patient had a PCN reaction causing immediate rash, facial/tongue/throat swelling, SOB or lightheadedness with hypotension: Yes ?Has patient had a PCN reaction causing severe rash involving mucus membranes or skin necrosis: No ?Has patient had a PCN reaction that required hospitalization No ?Has patient had a PCN reaction occurring within the last 10 years: Yes ?If all of the above answers are "NO", then may proceed with Cephalosporin use. ?  ?  ? ?Home Medications  ?Prior to Admission medications   ?Medication Sig Start Date End Date Taking? Authorizing Provider  ?acetaminophen (TYLENOL) 500 MG tablet Take 1,000 mg by mouth every 6 (six) hours as needed for mild pain.   Yes [provider]  ?albuterol (VENTOLIN HFA) 108 (90 Base) MCG/ACT inhaler Inhale 2 puffs into the lungs every 6 (six) hours as needed for wheezing or shortness of breath. ?Patient not taking: Reported on 12/29/2021 06/26/19   Roxan Hockey, MD  ?doxycycline (VIBRAMYCIN) 100 MG capsule Take 1 capsule (100 mg total) by mouth 2 (two) times daily. ?Patient not taking: Reported on  12/29/2021 11/27/21   Azucena Cecil, PA-C  ?levETIRAcetam (KEPPRA) 500 MG tablet Take 1 tablet (500 mg total) by mouth 2 (two) times daily. ?Patient not taking: Reported on 08/04/2019 06/26/19   Kenard Gower

## 2021-12-31 NOTE — Plan of Care (Signed)

## 2022-01-01 DIAGNOSIS — R918 Other nonspecific abnormal finding of lung field: Secondary | ICD-10-CM | POA: Diagnosis not present

## 2022-01-01 DIAGNOSIS — R0602 Shortness of breath: Secondary | ICD-10-CM | POA: Diagnosis not present

## 2022-01-01 DIAGNOSIS — E669 Obesity, unspecified: Secondary | ICD-10-CM | POA: Diagnosis not present

## 2022-01-01 DIAGNOSIS — J984 Other disorders of lung: Secondary | ICD-10-CM | POA: Diagnosis not present

## 2022-01-01 LAB — ROCKY MTN SPOTTED FVR ABS PNL(IGG+IGM)
RMSF IgG: NEGATIVE
RMSF IgM: 0.67 {index} (ref 0.00–0.89)

## 2022-01-01 LAB — GLUCOSE, CAPILLARY: Glucose-Capillary: 82 mg/dL (ref 70–99)

## 2022-01-01 LAB — PROCALCITONIN: Procalcitonin: <0.1 ng/mL

## 2022-01-01 NOTE — Plan of Care (Signed)
?  Problem: Acute Rehab PT Goals(only PT should resolve) ?Goal: Pt Will Go Supine/Side To Sit ?Outcome: Progressing ?Flowsheets (Taken 01/01/2022 1116) ?Pt will go Supine/Side to Sit: with modified independence ?Goal: Patient Will Transfer Sit To/From Stand ?Outcome: Progressing ?Flowsheets (Taken 01/01/2022 1116) ?Patient will transfer sit to/from stand: with modified independence ?Goal: Pt Will Transfer Bed To Chair/Chair To Bed ?Outcome: Progressing ?Flowsheets (Taken 01/01/2022 1116) ?Pt will Transfer Bed to Chair/Chair to Bed: with modified independence ?Goal: Pt Will Ambulate ?Outcome: Progressing ?Flowsheets (Taken 01/01/2022 1116) ?Pt will Ambulate: ? 75 feet ? with modified independence ? with supervision ? with least restrictive assistive device ?  ?11:17 AM, 01/01/22 ?Ocie Bob, MPT ?Physical Therapist with [place] ?[facility] ?[phone] office ?[phone] mobile phone ? ?

## 2022-01-01 NOTE — Evaluation (Signed)
Physical Therapy Evaluation ?Patient Details ?Name: Chelsey Sullivan ?MRN: [id number] ?DOB: 01-29-1986 ?Today's Date: 01/01/2022 ? ?History of Present Illness ? Chelsey Sullivan is a 36 y.o. 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  Relating on flat ground she tripped and fell forward landing on her chest she did not hit her head or lose consciousness.  Is had pain in her sternum since the fall which has been constant sharp and worse worse with inspiration.  She has tried Tylenol and ibuprofen with no help to her symptoms she has not had any seizures since pregnancy.  She does not feel short of breath she does appear winded.  She speaks in short phrases he has not used any alcohol or illicit drugs for quite some time and has no history of coronary syndrome.  Patient states that she is fatigued all the time and has been feeling very poorly and not sleeping well at all recently. ?  ?Clinical Impression ? Patient requires increased time for sitting up at bedside with labored movement with HOB flat.  Patient able to ambulate with slow labored cadence and occasional leaning on nearby objects for support without use of AD and no loss of balance, but limited mostly due to c/o fatigue.  Patient tolerated sitting up in chair after therapy and encouraged to ambulate with nursing staff as tolerated.  Patient will benefit from continued skilled physical therapy in hospital and recommended venue below to increase strength, balance, endurance for safe ADLs and gait.  ?   ?   ? ?Recommendations for follow up therapy are one component of a multi-disciplinary discharge planning process, led by the attending physician.  Recommendations may be updated based on patient status, additional functional criteria and insurance  authorization. ? ?Follow Up Recommendations Home health PT ? ?  ?Assistance Recommended at Discharge Set up Supervision/Assistance  ?Patient can return home with the following ? A little help with walking and/or transfers;A little help with bathing/dressing/bathroom;Help with stairs or ramp for entrance;Assistance with cooking/housework ? ?  ?Equipment Recommendations Rolling walker (2 wheels)  ?Recommendations for Other Services ?    ?  ?Functional Status Assessment Patient has had a recent decline in their functional status and demonstrates the ability to make significant improvements in function in a reasonable and predictable amount of time.  ? ?  ?Precautions / Restrictions Precautions ?Precautions: Fall ?Restrictions ?Weight Bearing Restrictions: No  ? ?  ? ?Mobility ? Bed Mobility ?Overal bed mobility: Needs Assistance ?Bed Mobility: Supine to Sit ?  ?  ?Supine to sit: Supervision ?  ?  ?General bed mobility comments: increased time, labored movement ?  ? ?Transfers ?Overall transfer level: Needs assistance ?Equipment used: None ?Transfers: Sit to/from Stand, Bed to chair/wheelchair/BSC ?Sit to Stand: Supervision ?  ?Step pivot transfers: Supervision ?  ?  ?  ?General transfer comment: increased time, labored movement ?  ? ?Ambulation/Gait ?Ambulation/Gait assistance: Supervision, Min guard ?Gait Distance (Feet): 22 Feet ?Assistive device: None ?Gait Pattern/deviations: Decreased step length - right, Decreased step length - left, Decreased stride length ?Gait velocity: decreased ?  ?  ?General Gait Details: slow labored cadence with occasional leaning on nearby objects for support, no loss of balance, limited mostly due to c/o fatigue ? ?Stairs ?  ?  ?  ?  ?  ? ?Wheelchair Mobility ?  ? ?  Modified Rankin (Stroke Patients Only) ?  ? ?  ? ?Balance Overall balance assessment: Needs assistance ?Sitting-balance support: Feet supported, No upper extremity supported ?Sitting balance-Leahy Scale: Good ?Sitting balance  - Comments: seated at EOB ?  ?Standing balance support: During functional activity, No upper extremity supported ?Standing balance-Leahy Scale: Fair ?Standing balance comment: without AD ?  ?  ?  ?  ?  ?  ?  ?  ?  ?  ?  ?   ? ? ? ?Pertinent Vitals/Pain Pain Assessment ?Pain Assessment: 0-10 ?Pain Score: 9  ?Pain Location: chest and low back ?Pain Descriptors / Indicators: Sore ?Pain Intervention(s): Limited activity within patient's tolerance, Monitored during session, Premedicated before session, Repositioned  ? ? ?Home Living Family/patient expects to be discharged to:: Private residence ?Living Arrangements: Spouse/significant other ?Available Help at Discharge: Family;Available PRN/intermittently ?Type of Home: House ?Home Access: Level entry ?  ?  ?  ?Home Layout: One level ?Home Equipment: None ?   ?  ?Prior Function Prior Level of Function : Independent/Modified Independent ?  ?  ?  ?  ?  ?  ?Mobility Comments: Community ambulator ?ADLs Comments: Independent ?  ? ? ?Hand Dominance  ?   ? ?  ?Extremity/Trunk Assessment  ? Upper Extremity Assessment ?Upper Extremity Assessment: Overall WFL for tasks assessed ?  ? ?Lower Extremity Assessment ?Lower Extremity Assessment: Generalized weakness ?  ? ?Cervical / Trunk Assessment ?Cervical / Trunk Assessment: Normal  ?Communication  ? Communication: No difficulties  ?Cognition Arousal/Alertness: Awake/alert ?Behavior During Therapy: [facility] for tasks assessed/performed ?Overall Cognitive Status: Within Functional Limits for tasks assessed ?  ?  ?  ?  ?  ?  ?  ?  ?  ?  ?  ?  ?  ?  ?  ?  ?  ?  ?  ? ?  ?General Comments   ? ?  ?Exercises    ? ?Assessment/Plan  ?  ?PT Assessment Patient needs continued PT services  ?PT Problem List Decreased strength;Decreased activity tolerance;Decreased balance;Decreased mobility ? ?   ?  ?PT Treatment Interventions DME instruction;Gait training;Stair training;Functional mobility training;Therapeutic activities;Therapeutic exercise;Balance  training   ? ?PT Goals (Current goals can be found in the Care Plan section)  ?Acute Rehab PT Goals ?Patient Stated Goal: return home with family to assist ?PT Goal Formulation: With patient ?Time For Goal Achievement: 01/05/22 ?Potential to Achieve Goals: Good ? ?  ?Frequency Min 3X/week ?  ? ? ?Co-evaluation   ?  ?  ?  ?  ? ? ?  ?AM-PAC PT "6 Clicks" Mobility  ?Outcome Measure Help needed turning from your back to your side while in a flat bed without using bedrails?: None ?Help needed moving from lying on your back to sitting on the side of a flat bed without using bedrails?: A Little ?Help needed moving to and from a bed to a chair (including a wheelchair)?: A Little ?Help needed standing up from a chair using your arms (e.g., wheelchair or bedside chair)?: A Little ?Help needed to walk in hospital room?: A Little ?Help needed climbing 3-5 steps with a railing? : A Little ?6 Click Score: 19 ? ?  ?End of Session   ?Activity Tolerance: Patient tolerated treatment well;Patient limited by fatigue ?Patient left: in chair;with call bell/phone within reach ?Nurse Communication: Mobility status ?PT Visit Diagnosis: Unsteadiness on feet (R26.81);Other abnormalities of gait and mobility (R26.89);Muscle weakness (generalized) (M62.81) ?  ? ?Time: [id number] ?PT Time Calculation (min) (ACUTE ONLY):  27 min ? ? ?Charges:   PT Evaluation ?$PT Eval Moderate Complexity: 1 Mod ?PT Treatments ?$Therapeutic Activity: 23-37 mins ?  ?   ? ? ?11:14 AM, 01/01/22 ?Lonell Grandchild, MPT ?Physical Therapist with [place] ?[facility] ?[phone] office ?[phone] mobile phone ? ? ?

## 2022-01-01 NOTE — Progress Notes (Signed)
?Progress Note ? ?PatientTanajah Sullivan JOI:[id number] DOB: [birth year]  ?DOA: 12/29/2021  DOS: 01/01/2022  ?  ?Brief hospital course: ?[name] is a 36 y.o. female with a history of seizure disorder, depression, iron deficiency anemia, cocaine use remotely who presented to the ED 5/8 with mid-chest pain after a trip and fall. She reported recent fatigue, poor sleep. CT scan of the chest showed multiple irregular densities noted throughout both lungs many of which are cavitary and the largest measures approximately 3.3 cm.  She has extensive mediastinal adenopathy as well as enlarged adenopathy in the porta hepatis region of the abdomen.  Her densities may represent septic emboli or multifocal infection they are concerning for metastatic disease given the extensive adenopathy as described CT scan of the abdomen and pelvis is recommended for further evaluation per radiology. ? ?IV antibiotics started, quantiferon gold and blood cultures sent and the patient was admitted for further work up with pulmonary consultation. ? ?Assessment and Plan: ?Bilateral pulmonary densities/fever: cavitary lesions, extensive mediastinal adenopathy. Pathologically enlarged upper abdominal lymph nodes up to 4cm. Nonspecific cervical lymphadenopathy. Splenomegaly, hepatomegaly. No convincing evidence of Lemierre's disease on CTA of the neck. There is paranasal sinus fluid, though pt doesn't have signs or symptoms of acute sinusitis at this time. Pt has some nonspecific constitutional symptoms, though fever curve (Tmax 99.1?F) and WBC (normal at 6.4k) are reassuring.  ?-Still having low-grade temp. ?- DDx to consider includes septic emboli and metastatic malignancy of unclear primary. Also need to consider fungal infection, auto-immune, and arboviruses.  ?- Will monitor blood cultures. Check PCT. ?- Echocardiogram is largely unremarkable, including no vegetations noted on TTE. ?- Pt has history of incarceration, will follow up quantiferon,  PPD. Continue airborne precautions due to concern for TB. ?- Appreciate pulmonary consultation. ?if bronch biopsy would be helpful pending quantiferon. Will need PET-CT after discharge and trial of empiric antibiotics.  ?-There is concern for vasculitis/collagen disorder; once TB rule out will provide treatment with steroids.  Patient ?-Following recommendations by pulmonologist head MRI has been ordered; no acute intracranial abnormalities appreciated. ?-Physical therapy has seen patient and recommended home health PT and rolling walker at discharge. ?-Transition antibiotics to oral Bactrim and follow response. ?- Note pt was evaluated in ED 11/27/2021 complaining of tick bite. This broadens differential somewhat, though there are not the typical features of tick-borne illnesses, specifically no rash. There is nonspecific liver and spleen enlargement, though no thrombocytopenia and LFTs are wnl. Check RMWF, lyme, ehrlichia titers.  ?-Continue to follow pending cultures results. ? ?History of cocaine use: No evidence of IVDU at this time on exam.  ?-HIV is nonreactive. ?-UDS positive for cocaine and opiates. ?-Cessation counseling provided. ? ?Chest pain: Due to trip/fall. No significant traumatic complications on CT's.  ?-Cardiac enzymes negative, EKG, 2D echo and telemetry reassuring and ruling out cardiac etiology. ?-Will DC telemetry.   ? ?Obesity: Estimated body mass index is 34.56 kg/m? as calculated from the following: ?  Height as of this encounter: 5\' 8"  (1.727 m). ?  Weight as of this encounter: 103.1 kg. ?-Low calorie diet, portion control and increase physical activity discussed with patient. ? ?GERD ?-Continue PPI ?  ?Subjective:  ?No nausea, no chest pain, still complaining of chest discomfort intermittently especially with deep breath.  Reports feeling weak, tired. ? ?Objective: ?Vitals:  ? 12/31/21 2141 01/01/22 0500 01/01/22 0519 01/01/22 1233  ?BP: 122/83  112/65 114/82  ?Pulse: 86  80 73  ?Resp: 19   16 20   ?  Temp: 98.4 ?F (36.9 ?C)  98.8 ?F (37.1 ?C) 97.8 ?F (36.6 ?C)  ?TempSrc:    Oral  ?SpO2: 97%  98% 99%  ?Weight:  103.1 kg    ?Height:      ? ?General exam: Alert, awake, oriented x 3, no chest pain, no nausea, no vomiting.  Still reported to feel weak and complaining of intermittent chest discomfort.  Afebrile ?Respiratory system: Clear to auscultation. Respiratory effort normal.  No requiring oxygen supplementation. ?Cardiovascular system:RRR. No murmurs, rubs, gallops.  No JVD. ?Gastrointestinal system: Abdomen is obese, nondistended, soft and nontender. No organomegaly or masses felt. Normal bowel sounds heard. ?Central nervous system: Alert and oriented. No focal neurological deficits. ?Extremities: No cyanosis or clubbing. ?Skin: No petechiae. ?Psychiatry: Judgement and insight appear normal.  Flat affect. ? ? ?Data Personally reviewed: ?CBC: ?Recent Labs  ?Lab 12/29/21 ?1315 12/30/21 ?0543  ?WBC 6.7 6.4  ?HGB 13.5 12.2  ?HCT 43.4 38.7  ?MCV 90.6 90.8  ?PLT 354 307  ? ?Basic Metabolic Panel: ?Recent Labs  ?Lab 12/29/21 ?1315 12/30/21 ?0543  ?NA 137 137  ?K 4.0 3.9  ?CL 105 111  ?CO2 24 19*  ?GLUCOSE 106* 96  ?BUN 9 9  ?CREATININE 0.86 0.85  ?CALCIUM 9.3 8.6*  ? ?GFR: ?Estimated Creatinine Clearance: 116.1 mL/min (by C-G formula based on SCr of 0.85 mg/dL). ? ?Liver Function Tests: ?Recent Labs  ?Lab 12/30/21 ?0543  ?AST 29  ?ALT 21  ?ALKPHOS 83  ?BILITOT 0.7  ?PROT 7.6  ?ALBUMIN 3.0*  ? ?CBG: ?Recent Labs  ?Lab 12/30/21 ?0716 12/31/21 ?[id number] 01/01/22 ?0752  ?GLUCAP 111* 84 82  ? ? ?Urine analysis: ?   ?Component Value Date/Time  ? COLORURINE YELLOW 09/16/2019 1426  ? APPEARANCEUR HAZY (A) 09/16/2019 1426  ? APPEARANCEUR Clear 01/23/2019 1503  ? LABSPEC 1.015 09/16/2019 1426  ? PHURINE 5.0 09/16/2019 1426  ? GLUCOSEU NEGATIVE 09/16/2019 1426  ? HGBUR NEGATIVE 09/16/2019 1426  ? BILIRUBINUR NEGATIVE 09/16/2019 1426  ? BILIRUBINUR Negative 01/23/2019 1503  ? KETONESUR NEGATIVE 09/16/2019 1426  ? PROTEINUR  NEGATIVE 09/16/2019 1426  ? UROBILINOGEN 4.0 (H) 08/15/2014 1816  ? NITRITE NEGATIVE 09/16/2019 1426  ? LEUKOCYTESUR TRACE (A) 09/16/2019 1426  ? ?Recent Results (from the past 240 hour(s))  ?Blood culture (routine x 2)     Status: Abnormal (Preliminary result)  ? Collection Time: 12/29/21  4:22 PM  ? Specimen: Right Antecubital; Blood  ?Result Value Ref Range Status  ? Specimen Description   Final  ?  RIGHT ANTECUBITAL ?Performed at [facility], [address] [place], [place] [address] ?  ? Special Requests   Final  ?  BOTTLES DRAWN AEROBIC AND ANAEROBIC Blood Culture adequate volume ?Performed at [facility], [address] [place], [place] [address] ?  ? Culture  Setup Time   Final  ?  GRAM POSITIVE COCCI anaerobic bottle Gram Stain Report Called to,Read Back By and Verified With: graves, b @0530  by matthews, b 5.10.2023 ?Organism ID to follow ?CRITICAL RESULT CALLED TO, READ BACK BY AND VERIFIED WITH: PHARMD ELVIRA M. 1914 [id number] FCP ?  ? Culture (A)  Final  ?  STAPHYLOCOCCUS CAPITIS ?THE SIGNIFICANCE OF ISOLATING THIS ORGANISM FROM A SINGLE SET OF BLOOD CULTURES WHEN MULTIPLE SETS ARE DRAWN IS UNCERTAIN. PLEASE NOTIFY THE MICROBIOLOGY DEPARTMENT WITHIN ONE WEEK IF SPECIATION AND SENSITIVITIES ARE REQUIRED. ?Performed at [facility], [address] [place], [place] [address] ?  ? Report Status PENDING  Incomplete  ?Blood Culture ID  Panel (Reflexed)     Status: Abnormal  ? Collection Time: 12/29/21  4:22 PM  ?Result Value Ref Range Status  ? Enterococcus faecalis NOT DETECTED NOT DETECTED Final  ? Enterococcus Faecium NOT DETECTED NOT DETECTED Final  ? Listeria monocytogenes NOT DETECTED NOT DETECTED Final  ? Staphylococcus species DETECTED (A) NOT DETECTED Final  ?  Comment: CRITICAL RESULT CALLED TO, READ BACK BY AND VERIFIED WITH: ?PHARMD ELVIRA M. [id number] FCP ?  ? Staphylococcus aureus (BCID) NOT DETECTED NOT DETECTED Final  ? Staphylococcus epidermidis NOT DETECTED NOT DETECTED Final  ?  Staphylococcus lugdunensis NOT DETECTED NOT DETECTED Final  ? Streptococcus species NOT DETECTED NOT DETECTED Final  ? Streptococcus agalactiae NOT DETECTED NOT DETECTED Final  ? Streptococcus pneumoniae NO

## 2022-01-02 DIAGNOSIS — E669 Obesity, unspecified: Secondary | ICD-10-CM | POA: Diagnosis not present

## 2022-01-02 DIAGNOSIS — J984 Other disorders of lung: Secondary | ICD-10-CM | POA: Diagnosis not present

## 2022-01-02 DIAGNOSIS — R918 Other nonspecific abnormal finding of lung field: Secondary | ICD-10-CM | POA: Diagnosis not present

## 2022-01-02 DIAGNOSIS — R0602 Shortness of breath: Secondary | ICD-10-CM | POA: Diagnosis not present

## 2022-01-02 LAB — QUANTIFERON-TB GOLD PLUS (RQFGPL)
QuantiFERON Mitogen Value: >10 IU/mL
QuantiFERON Nil Value: 0.21 IU/mL
QuantiFERON TB1 Ag Value: 0.21 IU/mL
QuantiFERON TB2 Ag Value: 0.23 IU/mL

## 2022-01-02 LAB — CULTURE, BLOOD (ROUTINE X 2): Special Requests: ADEQUATE

## 2022-01-02 LAB — GLUCOSE, CAPILLARY: Glucose-Capillary: 89 mg/dL (ref 70–99)

## 2022-01-02 LAB — BLASTOMYCES ANTIGEN: Blastomyces Antigen: NOT DETECTED ng/mL

## 2022-01-02 LAB — QUANTIFERON-TB GOLD PLUS: QuantiFERON-TB Gold Plus: NEGATIVE

## 2022-01-02 NOTE — TOC Progression Note (Signed)
Transition of Care (TOC) - Progression Note  ? ? ?Patient Details  ?Name: Chelsey Sullivan ?MRN: [id number] ?Date of Birth: [birth year] ? ?Transition of Care (TOC) CM/SW Contact  ?Armanda Heritage, RN ?Phone Number: ?01/02/2022, 12:40 PM ? ?Clinical Narrative:    ?CM spoke with patient regarding recommendations for HHPT services and RW.  Patient is in agreement, referral accepted by [facility] rep Kandee Keen for HHPT.  [employer] to deliver rolling walker to bedside for home use.  Patient reports she has a roommate who will assist her at home and pick her up from the hospital at discharge.  ? ? ?Expected Discharge Plan: Home w Home Health Services ?Barriers to Discharge: Continued Medical Work up ? ?Expected Discharge Plan and Services ?Expected Discharge Plan: Home w Home Health Services ?  ?Discharge Planning Services: CM Consult ?Post Acute Care Choice: Home Health ?Living arrangements for the past 2 months: Apartment ?                ?DME Arranged: Walker rolling ?DME Agency: [employer] ?Date DME Agency Contacted: 01/02/22 ?Time DME Agency Contacted: 1239 ?Representative spoke with at DME Agency: Morrie Sheldon ?HH Arranged: PT ?HH Agency: [facility] ?Date HH Agency Contacted: 01/02/22 ?Time HH Agency Contacted: 1240 ?Representative spoke with at [facility] Agency: Kandee Keen ? ? ?Social Determinants of Health (SDOH) Interventions ?  ? ?Readmission Risk Interventions ?   ? View : No data to display.  ?  ?  ?  ? ? ?

## 2022-01-02 NOTE — TOC Progression Note (Signed)
Transition of Care (TOC) - Progression Note  ? ? ?Patient Details  ?Name: Deziyah Arvin ?MRN: [id number] ?Date of Birth: Jun 24, 1986 ? ?Transition of Care (TOC) CM/SW Contact  ?Armanda Heritage, RN ?Phone Number: ?01/02/2022, 3:54 PM ? ?Clinical Narrative:    ?CM notified by Beatris Ship that unfortunately agency will not be able to accept patient for HHPT services as initially planned.  Currently no other HH agency able to accept patient.  MD and patient notified and patient is agreeable to OPPT services. Referral placed and information added to AVS.  ? ? ?Expected Discharge Plan: Home w Home Health Services ?Barriers to Discharge: Continued Medical Work up ? ?Expected Discharge Plan and Services ?Expected Discharge Plan: Home w Home Health Services ?  ?Discharge Planning Services: CM Consult ?Post Acute Care Choice: Home Health ?Living arrangements for the past 2 months: Apartment ?                ?DME Arranged: Walker rolling ?DME Agency: [employer] ?Date DME Agency Contacted: 01/02/22 ?Time DME Agency Contacted: 1239 ?Representative spoke with at DME Agency: Morrie Sheldon ?HH Arranged: PT ?HH Agency: [facility] ?Date HH Agency Contacted: 01/02/22 ?Time HH Agency Contacted: 1240 ?Representative spoke with at [facility] Agency: Kandee Keen ? ? ?Social Determinants of Health (SDOH) Interventions ?  ? ?Readmission Risk Interventions ?   ? View : No data to display.  ?  ?  ?  ? ? ?

## 2022-01-02 NOTE — Progress Notes (Signed)
?Progress Note ? ?PatientJenene Sullivan UTM:[id number] DOB: [birth year]  ?DOA: 12/29/2021  DOS: 01/02/2022  ?  ?Brief hospital course: ?Chelsey Sullivan is a 36 y.o. female with a history of seizure disorder, depression, iron deficiency anemia, cocaine use remotely who presented to the ED 5/8 with mid-chest pain after a trip and fall. She reported recent fatigue, poor sleep. CT scan of the chest showed multiple irregular densities noted throughout both lungs many of which are cavitary and the largest measures approximately 3.3 cm.  She has extensive mediastinal adenopathy as well as enlarged adenopathy in the porta hepatis region of the abdomen.  Her densities may represent septic emboli or multifocal infection they are concerning for metastatic disease given the extensive adenopathy as described CT scan of the abdomen and pelvis is recommended for further evaluation per radiology. ? ?IV antibiotics started, quantiferon gold and blood cultures sent and the patient was admitted for further work up with pulmonary consultation. ? ?Assessment and Plan: ?Bilateral pulmonary densities/fever: cavitary lesions, extensive mediastinal adenopathy. Pathologically enlarged upper abdominal lymph nodes up to 4cm. Nonspecific cervical lymphadenopathy. Splenomegaly, hepatomegaly. No convincing evidence of Lemierre's disease on CTA of the neck. There is paranasal sinus fluid, though pt doesn't have signs or symptoms of acute sinusitis at this time. Pt has some nonspecific constitutional symptoms, though fever curve (Tmax 99.1?F) and WBC (normal at 6.4k) at time of admission are reassuring.  ?-Currently afebrile. ?- DDx to consider includes septic emboli and metastatic malignancy of unclear primary. Also need to consider fungal infection, auto-immune, and arboviruses.  ?- Echocardiogram is largely unremarkable, including no vegetations noted on TTE. ?- Pt has history of incarceration, PPD and QuantiFERON gold for tuberculosis negative;  Ehrmann precaution has been discontinued. ?- Appreciate pulmonary consultation. ?if bronch biopsy would be helpful pending quantiferon. Will need PET-CT after discharge and trial of empiric antibiotics.  ?-There is concern for vasculitis/collagen disorder.  We will plan to start steroids tapering as patient with positive rheumatoid factor, elevated CRP and ESR.  But will wait GI evaluation to assure patient's tolerance and decrease chances for bleeding. ?-Following recommendations by pulmonologist head MRI has been ordered; no acute intracranial abnormalities appreciated. ?-Physical therapy has seen patient and recommended home health PT and rolling walker at discharge.  Given history of cocaine use home health agencies has denied to provide home treatments; DME will be provided and outpatient PT will be arranged as backup plan. ?-Transitioned antibiotics to oral Bactrim and follow response.  Patient has remained afebrile and so far tolerating oral antibiotics. ?-QuantiFERON gold Ehrlichia titers and [facility] fever results are negative. ?-Also negative cryptococcal antigen, Blastomyces antigen and Lyme disease serology. ?-ANCA profile negative ?- Note pt was evaluated in ED 11/27/2021 complaining of tick bite. This broadens differential somewhat, though there are not the typical features of tick-borne illnesses, specifically no rash. ?-Blood cultures demonstrating 1 out of 4 staph argues capitis suggesting contaminant. ?-Continue to follow pending cultures results. ? ?History of cocaine use: No evidence of IVDU at this time on exam.  ?-HIV is nonreactive. ?-UDS positive for cocaine and opiates. ?-Cessation counseling provided. ? ?Chest pain: Due to trip/fall. No significant traumatic complications on CT's.  ?-Cardiac enzymes negative, EKG, 2D echo and telemetry reassuring and ruling out cardiac etiology. ?-Continue supportive care and as needed analgesics. ? ?Obesity: Estimated body mass index is 34.93 kg/m? as  calculated from the following: ?  Height as of this encounter: '5\' 8"'  (1.727 m). ?  Weight as of this encounter: 104.2  kg. ?-Low calorie diet, portion control and increase physical activity discussed with patient. ? ?GERD/difficulty swallowing solids ?-Continue PPI ?-GI service has been contacted for assistance with evaluation and decision in the need of endoscopic evaluation. ?-Full liquid diet provided for now. ?  ?Subjective:  ?Patient afebrile, reporting feeling weak and tired; intermittent nonproductive coughing spells and also having nausea.  Patient reports inability to swallow solid food. ? ?Objective: ?Vitals:  ? 01/01/22 1233 01/01/22 2209 01/02/22 0557 01/02/22 1218  ?BP: 114/82 126/77 123/78 116/79  ?Pulse: 73 85 80 73  ?Resp: '20 19 18 18  ' ?Temp: 97.8 ?F (36.6 ?C) 98.8 ?F (37.1 ?C) 98.2 ?F (36.8 ?C) 98.4 ?F (36.9 ?C)  ?TempSrc: Oral  Oral Oral  ?SpO2: 99% 95% 96% 99%  ?Weight:   104.2 kg   ?Height:      ? ?General exam: Alert, awake, oriented x 3; reporting intermittent nausea and inability to swallow solid food.  Patient is afebrile, good saturation on room air. ?Respiratory system: No wheezing or crackles appreciated on exam; positive rhonchi bilaterally.  No using accessory muscle. ?Cardiovascular system:RRR. No murmurs, rubs, gallops.  No JVD. ?Gastrointestinal system: Abdomen is obese, nondistended, soft and nontender. No organomegaly or masses felt. Normal bowel sounds heard. ?Central nervous system: Alert and oriented. No focal neurological deficits. ?Extremities: No cyanosis or clubbing. ?Skin: No petechiae. ?Psychiatry: Judgement and insight appear normal. Mood & affect appropriate.  ? ?Data Personally reviewed: ?CBC: ?Recent Labs  ?Lab 12/29/21 ?1315 12/30/21 ?0543  ?WBC 6.7 6.4  ?HGB 13.5 12.2  ?HCT 43.4 38.7  ?MCV 90.6 90.8  ?PLT 354 307  ? ?Basic Metabolic Panel: ?Recent Labs  ?Lab 12/29/21 ?1315 12/30/21 ?0543  ?NA 137 137  ?K 4.0 3.9  ?CL 105 111  ?CO2 24 19*  ?GLUCOSE 106* 96  ?BUN 9 9   ?CREATININE 0.86 0.85  ?CALCIUM 9.3 8.6*  ? ?GFR: ?Estimated Creatinine Clearance: 116.7 mL/min (by C-G formula based on SCr of 0.85 mg/dL). ? ?Liver Function Tests: ?Recent Labs  ?Lab 12/30/21 ?0543  ?AST 29  ?ALT 21  ?ALKPHOS 83  ?BILITOT 0.7  ?PROT 7.6  ?ALBUMIN 3.0*  ? ?CBG: ?Recent Labs  ?Lab 12/30/21 ?0716 12/31/21 ?1173 01/01/22 ?5670 01/02/22 ?1410  ?GLUCAP 111* 84 82 89  ? ? ?Urine analysis: ?   ?Component Value Date/Time  ? COLORURINE YELLOW 09/16/2019 1426  ? APPEARANCEUR HAZY (A) 09/16/2019 1426  ? APPEARANCEUR Clear 01/23/2019 1503  ? LABSPEC 1.015 09/16/2019 1426  ? PHURINE 5.0 09/16/2019 1426  ? GLUCOSEU NEGATIVE 09/16/2019 1426  ? [place] NEGATIVE 09/16/2019 1426  ? [place] NEGATIVE 09/16/2019 1426  ? BILIRUBINUR Negative 01/23/2019 1503  ? [place] NEGATIVE 09/16/2019 1426  ? PROTEINUR NEGATIVE 09/16/2019 1426  ? UROBILINOGEN 4.0 (H) 08/15/2014 1816  ? NITRITE NEGATIVE 09/16/2019 1426  ? LEUKOCYTESUR TRACE (A) 09/16/2019 1426  ? ?Recent Results (from the past 240 hour(s))  ?Blood culture (routine x 2)     Status: Abnormal  ? Collection Time: 12/29/21  4:22 PM  ? Specimen: Right Antecubital; Blood  ?Result Value Ref Range Status  ? Specimen Description   Final  ?  RIGHT ANTECUBITAL ?Performed at [facility], [address] [place], [place] [address] ?  ? Special Requests   Final  ?  BOTTLES DRAWN AEROBIC AND ANAEROBIC Blood Culture adequate volume ?Performed at [facility], [address] [place], [place] [address] ?  ? Culture  Setup Time   Final  ?  GRAM POSITIVE COCCI anaerobic bottle Gram  Stain Report Called to,Read Back By and Verified With: graves, b '@0530'  by matthews, b 5.10.2023 ?Organism ID to follow ?CRITICAL RESULT CALLED TO, READ BACK BY AND VERIFIED WITH: [place] M. [id number] FCP ?  ? Culture (A)  Final  ?  STAPHYLOCOCCUS CAPITIS ?THE SIGNIFICANCE OF ISOLATING THIS ORGANISM FROM A SINGLE SET OF BLOOD CULTURES WHEN MULTIPLE SETS ARE DRAWN IS UNCERTAIN. PLEASE NOTIFY THE  MICROBIOLOGY DEPARTMENT WITHIN ONE WEEK IF SPECIATION AND SENSITIVITIES ARE REQUIRED. ?Performed at [facility], [place] [address], [place], [place] [address] ?  ? Report Status 01/02/2022 FINAL  Final  ?Blood

## 2022-01-03 DIAGNOSIS — R918 Other nonspecific abnormal finding of lung field: Secondary | ICD-10-CM | POA: Diagnosis not present

## 2022-01-03 DIAGNOSIS — K219 Gastro-esophageal reflux disease without esophagitis: Secondary | ICD-10-CM

## 2022-01-03 DIAGNOSIS — R1319 Other dysphagia: Secondary | ICD-10-CM

## 2022-01-03 DIAGNOSIS — E669 Obesity, unspecified: Secondary | ICD-10-CM | POA: Diagnosis not present

## 2022-01-03 DIAGNOSIS — R1310 Dysphagia, unspecified: Secondary | ICD-10-CM

## 2022-01-03 DIAGNOSIS — J984 Other disorders of lung: Secondary | ICD-10-CM | POA: Diagnosis not present

## 2022-01-03 DIAGNOSIS — R0602 Shortness of breath: Secondary | ICD-10-CM | POA: Diagnosis not present

## 2022-01-03 LAB — GLUCOSE, CAPILLARY
Glucose-Capillary: 81 mg/dL (ref 70–99)
Glucose-Capillary: 103 mg/dL — ABNORMAL HIGH (ref 70–99)
Glucose-Capillary: 60 mg/dL — ABNORMAL LOW (ref 70–99)
Glucose-Capillary: 239 mg/dL — ABNORMAL HIGH (ref 70–99)
Glucose-Capillary: 67 mg/dL — ABNORMAL LOW (ref 70–99)

## 2022-01-03 LAB — CULTURE, BLOOD (ROUTINE X 2): Culture: NO GROWTH

## 2022-01-03 MED ORDER — DEXTROSE-NACL 5-0.9 % IV SOLN: INTRAVENOUS | Status: DC

## 2022-01-03 MED ORDER — PANTOPRAZOLE SODIUM 40 MG PO TBEC
40.0000 mg | DELAYED_RELEASE_TABLET | Freq: Two times a day (BID) | ORAL | Status: DC
Start: 2022-01-03 — End: 2022-01-04
  Administered 2022-01-03 – 2022-01-04 (×2): 40 mg via ORAL
  Filled 2022-01-03 (×2): qty 1

## 2022-01-03 MED ORDER — DEXTROSE 50 % IV SOLN
1.0000 | Freq: Once | INTRAVENOUS | Status: AC
  Administered 2022-01-03: 50 mL via INTRAVENOUS
  Filled 2022-01-03: qty 50

## 2022-01-03 NOTE — H&P (View-Only) (Signed)
?Consulting  Provider: Dr. Gwenlyn Perking ?Primary Care Physician:  Patient, No Pcp Per (Inactive) ?Primary Gastroenterologist: Previously unassigned, Dr. Marletta Lor  ? ?Reason for Consultation: Dysphagia ? ?HPI:  ?Chelsey Sullivan is a 36 y.o. female with a past medical history of seizure disorder, depression, iron deficiency anemia, cocaine and opioid use, who was admitted to [facility] 12/29/2021 after initially presenting with generalized weakness/fatigue, chest pain, dyspnea. ? ?CT scan of the chest showed multiple irregular densities noted throughout both lungs many of which are cavitary and the largest measures approximately 3.3 cm.  She has extensive mediastinal adenopathy as well as enlarged adenopathy in the porta hepatis region of the abdomen. ? ?Pulmonary following.  Lung densities have been worked up.  2D echocardiogram unremarkable.  QuantiFERON gold, [facility] fever, Ehrlichia titers, cryptococcal antigen, Blastomyces, Lyme disease, ANCA profile negative.  1 out of 4 blood cultures with contaminant.  On antibiotics. ? ?Patient states for the last 2 to 3 weeks she has had progressively worsening dysphagia.  Notes symptoms primarily with food.  Able to tolerate liquids.  States food will go down and then regurgitates back up.  Occasional chest pain.  Also with chronic GERD.  No previous endoscopy.  No melena hematochezia.  Denies any chronic NSAID use.  UDS positive for cocaine on 12/30/2021.  She states she has not done cocaine in over a year.  She does mention that "you do not know what people put in some things these days." ? ?Currently on daily Protonix.  No improvement in symptoms. ? ?Past Medical History:  ?Diagnosis Date  ? Cocaine abuse (HCC)   ? Depression   ? Epilepsy (HCC)   ? ? ?Past Surgical History:  ?Procedure Laterality Date  ? HERNIA REPAIR    ? umbilical hernia repair x 2.  ? ? ?Prior to Admission medications   ?Medication Sig Start Date End Date Taking? Authorizing Provider  ?acetaminophen  (TYLENOL) 500 MG tablet Take 1,000 mg by mouth every 6 (six) hours as needed for mild pain.   Yes [provider]  ?albuterol (VENTOLIN HFA) 108 (90 Base) MCG/ACT inhaler Inhale 2 puffs into the lungs every 6 (six) hours as needed for wheezing or shortness of breath. ?Patient not taking: Reported on 12/29/2021 06/26/19   Shon Hale, MD  ?doxycycline (VIBRAMYCIN) 100 MG capsule Take 1 capsule (100 mg total) by mouth 2 (two) times daily. ?Patient not taking: Reported on 12/29/2021 11/27/21   Al Decant, PA-C  ?levETIRAcetam (KEPPRA) 500 MG tablet Take 1 tablet (500 mg total) by mouth 2 (two) times daily. ?Patient not taking: Reported on 08/04/2019 06/26/19   Shon Hale, MD  ?levETIRAcetam (KEPPRA) 500 MG tablet Take 1 tablet (500 mg total) by mouth 2 (two) times daily. ?Patient not taking: Reported on 11/27/2021 08/05/19   Sabas Sous, MD  ?methocarbamol (ROBAXIN) 500 MG tablet Take 1 tablet (500 mg total) by mouth 2 (two) times daily. ?Patient not taking: Reported on 11/27/2021 10/31/19   Arlyn Dunning, PA-C  ?ferrous sulfate 325 (65 FE) MG tablet Take 1 tablet (325 mg total) by mouth 2 (two) times daily with a meal. ?Patient not taking: Reported on 08/04/2019 06/26/19 10/15/19  Shon Hale, MD  ?pantoprazole (PROTONIX) 40 MG tablet Take 1 tablet (40 mg total) by mouth daily. ?Patient not taking: Reported on 08/04/2019 06/26/19 10/15/19  Shon Hale, MD  ? ? ?Current Facility-Administered Medications  ?Medication Dose Route Frequency Provider Last Rate Last Admin  ? 0.9 %  sodium chloride  infusion   Intravenous Continuous Vassie LollMadera, Carlos, MD 100 mL/hr at 01/03/22 0924 New Bag at 01/03/22 [id number]  ? acetaminophen (TYLENOL) tablet 650 mg  650 mg Oral Q6H PRN Lahoma CrockerSheehan, Theresa C, MD      ? Or  ? acetaminophen (TYLENOL) suppository 650 mg  650 mg Rectal Q6H PRN Lahoma CrockerSheehan, Theresa C, MD      ? albuterol (PROVENTIL) (2.5 MG/3ML) 0.083% nebulizer solution 3 mL  3 mL Inhalation Q6H PRN Lahoma CrockerSheehan, Theresa  C, MD      ? enoxaparin (LOVENOX) injection 40 mg  40 mg Subcutaneous Q24H Lahoma CrockerSheehan, Theresa C, MD   40 mg at 01/02/22 1704  ? HYDROmorphone (DILAUDID) injection 0.5 mg  0.5 mg Intravenous Q2H PRN Lahoma CrockerSheehan, Theresa C, MD   0.5 mg at 01/03/22 [id number]  ? levETIRAcetam (KEPPRA) tablet 500 mg  500 mg Oral BID Tyrone NineGrunz, Ryan B, MD   500 mg at 01/03/22 [id number]  ? ondansetron (ZOFRAN) tablet 4 mg  4 mg Oral Q6H PRN Lahoma CrockerSheehan, Theresa C, MD      ? Or  ? ondansetron [facility](ZOFRAN) injection 4 mg  4 mg Intravenous Q6H PRN Lahoma CrockerSheehan, Theresa C, MD      ? oxyCODONE (Oxy IR/ROXICODONE) immediate release tablet 10 mg  10 mg Oral Q4H PRN Lahoma CrockerSheehan, Theresa C, MD   10 mg at 01/03/22 1027  ? pantoprazole (PROTONIX) EC tablet 40 mg  40 mg Oral Daily Vassie LollMadera, Carlos, MD   40 mg at 01/03/22 0840  ? polyethylene glycol (MIRALAX / GLYCOLAX) packet 17 g  17 g Oral Daily PRN Lahoma CrockerSheehan, Theresa C, MD   17 g at 01/02/22 0905  ? sodium chloride flush (NS) 0.9 % injection 3 mL  3 mL Intravenous Q12H Lahoma CrockerSheehan, Theresa C, MD   3 mL at 01/02/22 2230  ? sulfamethoxazole-trimethoprim (BACTRIM DS) 800-160 MG per tablet 1 tablet  1 tablet Oral Q12H Vassie LollMadera, Carlos, MD   1 tablet at 01/03/22 [id number]  ? traZODone (DESYREL) tablet 25 mg  25 mg Oral QHS PRN Lahoma CrockerSheehan, Theresa C, MD   25 mg at 01/02/22 2251  ? ? ?Allergies as of 12/29/2021 - Review Complete 12/29/2021  ?Allergen Reaction Noted  ? Amoxicillin Rash 01/09/2014  ? Penicillins Rash 11/24/2012  ? ? ?Family History  ?Problem Relation Age of Onset  ? Hypertension Other   ? Diabetes Other   ? Epilepsy Other   ? Seizures Sister   ? Hypertension Maternal Grandmother   ? Kidney disease Maternal Grandmother   ? ? ?Social History  ? ?Socioeconomic History  ? Marital status: Legally Separated  ?  Spouse name: Marja KaysJonathon  ? Number of children: 6  ? Years of education: Not on file  ? Highest education level: Not on file  ?Occupational History  ? Occupation: unemployed  ?Tobacco Use  ? Smoking status: Every Day  ?  Packs/day: 1.00  ?   Types: Cigarettes  ? Smokeless tobacco: Never  ?Vaping Use  ? Vaping Use: Never used  ?Substance and Sexual Activity  ? Alcohol use: Not Currently  ? Drug use: Not Currently  ?  Types: Cocaine  ? Sexual activity: Yes  ?  Birth control/protection: None  ?Other Topics Concern  ? Not on file  ?Social History Narrative  ? Not on file  ? ?Social Determinants of Health  ? ?Financial Resource Strain: Not on file  ?Food Insecurity: Not on file  ?Transportation Needs: Not on file  ?Physical Activity: Not on file  ?Stress: Not on file  ?  Social Connections: Not on file  ?Intimate Partner Violence: Not on file  ? ? ?Review of Systems: ?General: Negative for anorexia, weight loss, fever, chills, fatigue, weakness. ?Eyes: Negative for vision changes.  ?ENT: Negative for hoarseness, difficulty swallowing , nasal congestion. ?CV: Positive for chest pain, negative palpitations, positive for dyspnea on exertion, negative peripheral edema.  ?Respiratory:patient notes dyspnea on exertion, cough, sputum, wheezing.  ?GI: See history of present illness. ?GU:  Negative for dysuria, hematuria, urinary incontinence, urinary frequency, nocturnal urination.  ?MS: Negative for joint pain, low back pain.  ?Derm: Negative for rash or itching.  ?Neuro: Negative for weakness, abnormal sensation, seizure, frequent headaches, memory loss, confusion.  ?Psych: Negative for anxiety, depression ?Endo: Negative for unusual weight change.  ?Heme: Negative for bruising or bleeding. ?Allergy: Negative for rash or hives. ? ?Physical Exam: ?Vital signs in last 24 hours: ?Temp:  [97.7 ?F (36.5 ?C)-98.4 ?F (36.9 ?C)] 97.7 ?F (36.5 ?C) (05/13 0530) ?Pulse Rate:  [73-78] 73 (05/13 0530) ?Resp:  [18] 18 (05/13 0530) ?BP: (107-116)/(76-82) 107/76 (05/13 0530) ?SpO2:  [96 %-99 %] 96 % (05/13 0530) ?Weight:  [104.9 kg] 104.9 kg (05/13 0530) ?Last BM Date : 12/29/21 ?General:   Alert,  Well-developed, well-nourished, pleasant and cooperative in NAD ?Head:   Normocephalic and atraumatic. ?Eyes:  Sclera clear, no icterus.   Conjunctiva pink. ?Ears:  Normal auditory acuity. ?Nose:  No deformity, discharge,  or lesions. ?Mouth:  No deformity or lesions, dentition normal. ?N

## 2022-01-03 NOTE — Consult Note (Addendum)
?Consulting  Provider: Dr. Gwenlyn Perking ?Primary Care Physician:  Patient, No Pcp Per (Inactive) ?Primary Gastroenterologist: Previously unassigned, Dr. Marletta Lor  ? ?Reason for Consultation: Dysphagia ? ?HPI:  ?Chelsey Sullivan is a 36 y.o. female with a past medical history of seizure disorder, depression, iron deficiency anemia, cocaine and opioid use, who was admitted to [facility] 12/29/2021 after initially presenting with generalized weakness/fatigue, chest pain, dyspnea. ? ?CT scan of the chest showed multiple irregular densities noted throughout both lungs many of which are cavitary and the largest measures approximately 3.3 cm.  She has extensive mediastinal adenopathy as well as enlarged adenopathy in the porta hepatis region of the abdomen. ? ?Pulmonary following.  Lung densities have been worked up.  2D echocardiogram unremarkable.  QuantiFERON gold, [facility] fever, Ehrlichia titers, cryptococcal antigen, Blastomyces, Lyme disease, ANCA profile negative.  1 out of 4 blood cultures with contaminant.  On antibiotics. ? ?Patient states for the last 2 to 3 weeks she has had progressively worsening dysphagia.  Notes symptoms primarily with food.  Able to tolerate liquids.  States food will go down and then regurgitates back up.  Occasional chest pain.  Also with chronic GERD.  No previous endoscopy.  No melena hematochezia.  Denies any chronic NSAID use.  UDS positive for cocaine on 12/30/2021.  She states she has not done cocaine in over a year.  She does mention that "you do not know what people put in some things these days." ? ?Currently on daily Protonix.  No improvement in symptoms. ? ?Past Medical History:  ?Diagnosis Date  ? Cocaine abuse (HCC)   ? Depression   ? Epilepsy (HCC)   ? ? ?Past Surgical History:  ?Procedure Laterality Date  ? HERNIA REPAIR    ? umbilical hernia repair x 2.  ? ? ?Prior to Admission medications   ?Medication Sig Start Date End Date Taking? Authorizing Provider  ?acetaminophen  (TYLENOL) 500 MG tablet Take 1,000 mg by mouth every 6 (six) hours as needed for mild pain.   Yes [provider]  ?albuterol (VENTOLIN HFA) 108 (90 Base) MCG/ACT inhaler Inhale 2 puffs into the lungs every 6 (six) hours as needed for wheezing or shortness of breath. ?Patient not taking: Reported on 12/29/2021 06/26/19   Shon Hale, MD  ?doxycycline (VIBRAMYCIN) 100 MG capsule Take 1 capsule (100 mg total) by mouth 2 (two) times daily. ?Patient not taking: Reported on 12/29/2021 11/27/21   Al Decant, PA-C  ?levETIRAcetam (KEPPRA) 500 MG tablet Take 1 tablet (500 mg total) by mouth 2 (two) times daily. ?Patient not taking: Reported on 08/04/2019 06/26/19   Shon Hale, MD  ?levETIRAcetam (KEPPRA) 500 MG tablet Take 1 tablet (500 mg total) by mouth 2 (two) times daily. ?Patient not taking: Reported on 11/27/2021 08/05/19   Sabas Sous, MD  ?methocarbamol (ROBAXIN) 500 MG tablet Take 1 tablet (500 mg total) by mouth 2 (two) times daily. ?Patient not taking: Reported on 11/27/2021 10/31/19   Arlyn Dunning, PA-C  ?ferrous sulfate 325 (65 FE) MG tablet Take 1 tablet (325 mg total) by mouth 2 (two) times daily with a meal. ?Patient not taking: Reported on 08/04/2019 06/26/19 10/15/19  Shon Hale, MD  ?pantoprazole (PROTONIX) 40 MG tablet Take 1 tablet (40 mg total) by mouth daily. ?Patient not taking: Reported on 08/04/2019 06/26/19 10/15/19  Shon Hale, MD  ? ? ?Current Facility-Administered Medications  ?Medication Dose Route Frequency Provider Last Rate Last Admin  ? 0.9 %  sodium chloride  infusion   Intravenous Continuous Madera, Carlos, MD 100 mL/hr at 01/03/22 0924 New Bag at 01/03/22 0924  ? acetaminophen (TYLENOL) tablet 650 mg  650 mg Oral Q6H PRN Sheehan, Theresa C, MD      ? Or  ? acetaminophen (TYLENOL) suppository 650 mg  650 mg Rectal Q6H PRN Sheehan, Theresa C, MD      ? albuterol (PROVENTIL) (2.5 MG/3ML) 0.083% nebulizer solution 3 mL  3 mL Inhalation Q6H PRN Sheehan, Theresa  C, MD      ? enoxaparin (LOVENOX) injection 40 mg  40 mg Subcutaneous Q24H Sheehan, Theresa C, MD   40 mg at 01/02/22 1704  ? HYDROmorphone (DILAUDID) injection 0.5 mg  0.5 mg Intravenous Q2H PRN Sheehan, Theresa C, MD   0.5 mg at 01/03/22 0839  ? levETIRAcetam (KEPPRA) tablet 500 mg  500 mg Oral BID Grunz, Ryan B, MD   500 mg at 01/03/22 0839  ? ondansetron (ZOFRAN) tablet 4 mg  4 mg Oral Q6H PRN Sheehan, Theresa C, MD      ? Or  ? ondansetron (ZOFRAN) injection 4 mg  4 mg Intravenous Q6H PRN Sheehan, Theresa C, MD      ? oxyCODONE (Oxy IR/ROXICODONE) immediate release tablet 10 mg  10 mg Oral Q4H PRN Sheehan, Theresa C, MD   10 mg at 01/03/22 1027  ? pantoprazole (PROTONIX) EC tablet 40 mg  40 mg Oral Daily Madera, Carlos, MD   40 mg at 01/03/22 0840  ? polyethylene glycol (MIRALAX / GLYCOLAX) packet 17 g  17 g Oral Daily PRN Sheehan, Theresa C, MD   17 g at 01/02/22 0905  ? sodium chloride flush (NS) 0.9 % injection 3 mL  3 mL Intravenous Q12H Sheehan, Theresa C, MD   3 mL at 01/02/22 2230  ? sulfamethoxazole-trimethoprim (BACTRIM DS) 800-160 MG per tablet 1 tablet  1 tablet Oral Q12H Madera, Carlos, MD   1 tablet at 01/03/22 0839  ? traZODone (DESYREL) tablet 25 mg  25 mg Oral QHS PRN Sheehan, Theresa C, MD   25 mg at 01/02/22 2251  ? ? ?Allergies as of 12/29/2021 - Review Complete 12/29/2021  ?Allergen Reaction Noted  ? Amoxicillin Rash 01/09/2014  ? Penicillins Rash 11/24/2012  ? ? ?Family History  ?Problem Relation Age of Onset  ? Hypertension Other   ? Diabetes Other   ? Epilepsy Other   ? Seizures Sister   ? Hypertension Maternal Grandmother   ? Kidney disease Maternal Grandmother   ? ? ?Social History  ? ?Socioeconomic History  ? Marital status: Legally Separated  ?  Spouse name: Jonathon  ? Number of children: 6  ? Years of education: Not on file  ? Highest education level: Not on file  ?Occupational History  ? Occupation: unemployed  ?Tobacco Use  ? Smoking status: Every Day  ?  Packs/day: 1.00  ?   Types: Cigarettes  ? Smokeless tobacco: Never  ?Vaping Use  ? Vaping Use: Never used  ?Substance and Sexual Activity  ? Alcohol use: Not Currently  ? Drug use: Not Currently  ?  Types: Cocaine  ? Sexual activity: Yes  ?  Birth control/protection: None  ?Other Topics Concern  ? Not on file  ?Social History Narrative  ? Not on file  ? ?Social Determinants of Health  ? ?Financial Resource Strain: Not on file  ?Food Insecurity: Not on file  ?Transportation Needs: Not on file  ?Physical Activity: Not on file  ?Stress: Not on file  ?  Social Connections: Not on file  ?Intimate Partner Violence: Not on file  ? ? ?Review of Systems: ?General: Negative for anorexia, weight loss, fever, chills, fatigue, weakness. ?Eyes: Negative for vision changes.  ?ENT: Negative for hoarseness, difficulty swallowing , nasal congestion. ?CV: Positive for chest pain, negative palpitations, positive for dyspnea on exertion, negative peripheral edema.  ?Respiratory:patient notes dyspnea on exertion, cough, sputum, wheezing.  ?GI: See history of present illness. ?GU:  Negative for dysuria, hematuria, urinary incontinence, urinary frequency, nocturnal urination.  ?MS: Negative for joint pain, low back pain.  ?Derm: Negative for rash or itching.  ?Neuro: Negative for weakness, abnormal sensation, seizure, frequent headaches, memory loss, confusion.  ?Psych: Negative for anxiety, depression ?Endo: Negative for unusual weight change.  ?Heme: Negative for bruising or bleeding. ?Allergy: Negative for rash or hives. ? ?Physical Exam: ?Vital signs in last 24 hours: ?Temp:  [97.7 ?F (36.5 ?C)-98.4 ?F (36.9 ?C)] 97.7 ?F (36.5 ?C) (05/13 0530) ?Pulse Rate:  [73-78] 73 (05/13 0530) ?Resp:  [18] 18 (05/13 0530) ?BP: (107-116)/(76-82) 107/76 (05/13 0530) ?SpO2:  [96 %-99 %] 96 % (05/13 0530) ?Weight:  [104.9 kg] 104.9 kg (05/13 0530) ?Last BM Date : 12/29/21 ?General:   Alert,  Well-developed, well-nourished, pleasant and cooperative in NAD ?Head:   Normocephalic and atraumatic. ?Eyes:  Sclera clear, no icterus.   Conjunctiva pink. ?Ears:  Normal auditory acuity. ?Nose:  No deformity, discharge,  or lesions. ?Mouth:  No deformity or lesions, dentition normal. ?N

## 2022-01-03 NOTE — Progress Notes (Signed)
?Progress Note ? ?Chelsey Sullivan UTM:[id number] DOB: [birth year]  ?DOA: 12/29/2021  DOS: 01/03/2022  ?  ?Brief hospital course: ?[name] is a 36 y.o. female with a history of seizure disorder, depression, iron deficiency anemia, cocaine use remotely who presented to the ED 5/8 with mid-chest pain after a trip and fall. She reported recent fatigue, poor sleep. CT scan of the chest showed multiple irregular densities noted throughout both lungs many of which are cavitary and the largest measures approximately 3.3 cm.  She has extensive mediastinal adenopathy as well as enlarged adenopathy in the porta hepatis region of the abdomen.  Her densities may represent septic emboli or multifocal infection they are concerning for metastatic disease given the extensive adenopathy as described CT scan of the abdomen and pelvis is recommended for further evaluation per radiology. ? ?IV antibiotics started, quantiferon gold and blood cultures sent and the patient was admitted for further work up with pulmonary consultation. ? ?Assessment and Plan: ?Bilateral pulmonary densities/fever: cavitary lesions, extensive mediastinal adenopathy. Pathologically enlarged upper abdominal lymph nodes up to 4cm. Nonspecific cervical lymphadenopathy. Splenomegaly, hepatomegaly. No convincing evidence of Lemierre's disease on CTA of the neck. There is paranasal sinus fluid, though pt doesn't have signs or symptoms of acute sinusitis at this time. Pt has some nonspecific constitutional symptoms, though fever curve (Tmax 99.1?F) and WBC (normal at 6.4k) at time of admission are reassuring.  ?-Currently afebrile. ?- DDx to consider includes septic emboli and metastatic malignancy of unclear primary. Also need to consider fungal infection, auto-immune, and arboviruses.  ?- Echocardiogram is largely unremarkable, including no vegetations noted on TTE. ?- Pt has history of incarceration, PPD and QuantiFERON gold for tuberculosis negative;  Ehrmann precaution has been discontinued. ?- Appreciate pulmonary consultation. ?if bronch biopsy would be helpful pending quantiferon. Will need PET-CT after discharge and trial of empiric antibiotics.  ?-There is concern for vasculitis/collagen disorder.  We will plan to start steroids tapering as patient with positive rheumatoid factor, elevated CRP and ESR.  But will wait GI evaluation to assure patient's tolerance and decrease chances for bleeding. ?-Following recommendations by pulmonologist head MRI has been ordered; no acute intracranial abnormalities appreciated. ?-Physical therapy has seen patient and recommended home health PT and rolling walker at discharge.  Given history of cocaine use home health agencies has denied to provide home treatments; DME will be provided and outpatient PT will be arranged as backup plan. ?-Transitioned antibiotics to oral Bactrim and follow response.  Patient has remained afebrile and so far tolerating oral antibiotics. ?-QuantiFERON gold Ehrlichia titers and [facility] fever results are negative. ?-Also negative cryptococcal antigen, Blastomyces antigen and Lyme disease serology. ?-ANCA profile negative ?- Note pt was evaluated in ED 11/27/2021 complaining of tick bite. This broadens differential somewhat, though there are not the typical features of tick-borne illnesses, specifically no rash. ?-Blood cultures demonstrating 1 out of 4 staph argues capitis suggesting contaminant. ?-Continue to follow pending cultures results. ? ?History of cocaine use: No evidence of IVDU at this time on exam.  ?-HIV is nonreactive. ?-UDS positive for cocaine and opiates. ?-Cessation counseling provided. ? ?Chest pain: Due to trip/fall. No significant traumatic complications on CT's.  ?-Cardiac enzymes negative, EKG, 2D echo and telemetry reassuring and ruling out cardiac etiology. ?-Continue supportive care and as needed analgesics. ? ?Obesity: Estimated body mass index is 35.16 kg/m? as  calculated from the following: ?  Height as of this encounter: 5' 8"  (1.727 m). ?  Weight as of this encounter: 104.9  kg. ?-Low calorie diet, portion control and increase physical activity discussed with patient. ? ?GERD/difficulty swallowing solids ?-Continue PPI ?-Appreciate GI service assistance and recommendation. ?-Full liquid diet provided for now; n.p.o. after midnight with plans for EGD tomorrow morning.. ?  ?Subjective:  ?No fever, reports significant improvement in her chest discomfort and expressed feeling weak and tired.  Still with difficulty swallowing solids.  No nausea or vomiting reported. ? ?Objective: ?Vitals:  ? 01/02/22 1218 01/02/22 2044 01/03/22 0530 01/03/22 1427  ?BP: 116/79 110/82 107/76 119/79  ?Pulse: 73 78 73 65  ?Resp: 18 18 18 18   ?Temp: 98.4 ?F (36.9 ?C) 98 ?F (36.7 ?C) 97.7 ?F (36.5 ?C) 97.7 ?F (36.5 ?C)  ?TempSrc: Oral   Oral  ?SpO2: 99% 97% 96% 96%  ?Weight:   104.9 kg   ?Height:      ? ?General exam: Alert, awake, oriented x 3; no fever, reporting improvement in her chest discomfort with deep breath.  No nausea, no vomiting.  Expressed decrease appetite and inability to swallow solids. ?Respiratory system: Clear to auscultation. Respiratory effort normal.  No requiring oxygen supplementation. ?Cardiovascular system:RRR. No murmurs, rubs, gallops.  No JVD. ?Gastrointestinal system: Abdomen is obese, nondistended, soft and nontender. No organomegaly or masses felt. Normal bowel sounds heard. ?Central nervous system: Alert and oriented. No focal neurological deficits. ?Extremities: No cyanosis or clubbing. ?Skin: No petechiae. ?Psychiatry: Judgement and insight appear normal. Mood & affect appropriate.  ? ?Data Personally reviewed: ?CBC: ?Recent Labs  ?Lab 12/29/21 ?1315 12/30/21 ?0543  ?WBC 6.7 6.4  ?HGB 13.5 12.2  ?HCT 43.4 38.7  ?MCV 90.6 90.8  ?PLT 354 307  ? ?Basic Metabolic Panel: ?Recent Labs  ?Lab 12/29/21 ?1315 12/30/21 ?0543  ?NA 137 137  ?K 4.0 3.9  ?CL 105 111  ?CO2 24  19*  ?GLUCOSE 106* 96  ?BUN 9 9  ?CREATININE 0.86 0.85  ?CALCIUM 9.3 8.6*  ? ?GFR: ?Estimated Creatinine Clearance: 117.1 mL/min (by C-G formula based on SCr of 0.85 mg/dL). ? ?Liver Function Tests: ?Recent Labs  ?Lab 12/30/21 ?0543  ?AST 29  ?ALT 21  ?ALKPHOS 83  ?BILITOT 0.7  ?PROT 7.6  ?ALBUMIN 3.0*  ? ?CBG: ?Recent Labs  ?Lab 12/31/21 ?0733 01/01/22 ?0752 01/02/22 ?0723 01/03/22 ?0750 01/03/22 ?1115  ?GLUCAP 84 82 89 81 103*  ? ? ?Urine analysis: ?   ?Component Value Date/Time  ? COLORURINE YELLOW 09/16/2019 1426  ? APPEARANCEUR HAZY (A) 09/16/2019 1426  ? APPEARANCEUR Clear 01/23/2019 1503  ? LABSPEC 1.015 09/16/2019 1426  ? PHURINE 5.0 09/16/2019 1426  ? GLUCOSEU NEGATIVE 09/16/2019 1426  ? [place] NEGATIVE 09/16/2019 1426  ? [place] NEGATIVE 09/16/2019 1426  ? BILIRUBINUR Negative 01/23/2019 1503  ? [place] NEGATIVE 09/16/2019 1426  ? PROTEINUR NEGATIVE 09/16/2019 1426  ? UROBILINOGEN 4.0 (H) 08/15/2014 1816  ? NITRITE NEGATIVE 09/16/2019 1426  ? LEUKOCYTESUR TRACE (A) 09/16/2019 1426  ? ?Recent Results (from the past 240 hour(s))  ?Blood culture (routine x 2)     Status: Abnormal  ? Collection Time: 12/29/21  4:22 PM  ? Specimen: Right Antecubital; Blood  ?Result Value Ref Range Status  ? Specimen Description   Final  ?  RIGHT ANTECUBITAL ?Performed at [facility], [address] [place], [place] [address] ?  ? Special Requests   Final  ?  BOTTLES DRAWN AEROBIC AND ANAEROBIC Blood Culture adequate volume ?Performed at [facility], [address], [place], [place] [id number] ?  ? Culture  Setup Time   Final  ?  GRAM POSITIVE COCCI anaerobic bottle Gram Stain Report Called to,Read Back By and Verified With: graves, b @0530  by matthews, b 5.10.2023 ?Organism ID to follow ?CRITICAL RESULT CALLED TO, READ BACK BY AND VERIFIED WITH: Shawnee M. [id number] FCP ?  ? Culture (A)  Final  ?  STAPHYLOCOCCUS CAPITIS ?THE SIGNIFICANCE OF ISOLATING THIS ORGANISM FROM A SINGLE SET OF BLOOD CULTURES WHEN MULTIPLE  SETS ARE DRAWN IS UNCERTAIN. PLEASE NOTIFY THE MICROBIOLOGY DEPARTMENT WITHIN ONE WEEK IF SPECIATION AND SENSITIVITIES ARE REQUIRED. ?Performed at [facility], [place] [address], [place], [place] [address]

## 2022-01-04 ENCOUNTER — Encounter (HOSPITAL_COMMUNITY)
Admission: EM | Disposition: A | Discharge status: Home Health | Payer: Self-pay | Source: Home / Self Care | Attending: Internal Medicine

## 2022-01-04 ENCOUNTER — Inpatient Hospital Stay (HOSPITAL_COMMUNITY): Payer: Medicaid Other | Admitting: Anesthesiology

## 2022-01-04 DIAGNOSIS — R1319 Other dysphagia: Secondary | ICD-10-CM | POA: Diagnosis not present

## 2022-01-04 DIAGNOSIS — R918 Other nonspecific abnormal finding of lung field: Secondary | ICD-10-CM | POA: Diagnosis not present

## 2022-01-04 DIAGNOSIS — J984 Other disorders of lung: Principal | ICD-10-CM

## 2022-01-04 DIAGNOSIS — K2100 Gastro-esophageal reflux disease with esophagitis, without bleeding: Secondary | ICD-10-CM | POA: Diagnosis not present

## 2022-01-04 DIAGNOSIS — E669 Obesity, unspecified: Secondary | ICD-10-CM

## 2022-01-04 DIAGNOSIS — K2970 Gastritis, unspecified, without bleeding: Secondary | ICD-10-CM

## 2022-01-04 DIAGNOSIS — E66811 Obesity, class 1: Secondary | ICD-10-CM

## 2022-01-04 DIAGNOSIS — R509 Fever, unspecified: Secondary | ICD-10-CM

## 2022-01-04 DIAGNOSIS — K222 Esophageal obstruction: Secondary | ICD-10-CM

## 2022-01-04 HISTORY — PX: ESOPHAGOGASTRODUODENOSCOPY (EGD) WITH PROPOFOL: SHX5813

## 2022-01-04 LAB — GLUCOSE, CAPILLARY: Glucose-Capillary: 99 mg/dL (ref 70–99)

## 2022-01-04 SURGERY — ESOPHAGOGASTRODUODENOSCOPY (EGD) WITH PROPOFOL
Anesthesia: General

## 2022-01-04 MED ORDER — PREDNISONE 10 MG PO TABS: ORAL_TABLET | ORAL | 0 refills | Status: AC

## 2022-01-04 MED ORDER — SULFAMETHOXAZOLE-TRIMETHOPRIM 800-160 MG PO TABS
1.0000 | ORAL_TABLET | Freq: Two times a day (BID) | ORAL | 0 refills | Status: AC
Start: 2022-01-04 — End: 2022-01-09

## 2022-01-04 MED ORDER — LACTATED RINGERS IV SOLN: INTRAVENOUS | Status: DC | PRN

## 2022-01-04 MED ORDER — PROPOFOL 500 MG/50ML IV EMUL
INTRAVENOUS | Status: AC
  Filled 2022-01-04: qty 50

## 2022-01-04 MED ORDER — SUCRALFATE 1 GM/10ML PO SUSP: 1.0000 g | Freq: Three times a day (TID) | ORAL | 0 refills | Status: DC

## 2022-01-04 MED ORDER — LACTATED RINGERS IV SOLN: INTRAVENOUS | Status: DC

## 2022-01-04 MED ORDER — PANTOPRAZOLE SODIUM 40 MG PO TBEC: DELAYED_RELEASE_TABLET | ORAL | 3 refills | Status: DC

## 2022-01-04 MED ORDER — TRAMADOL HCL 50 MG PO TABS
50.0000 mg | ORAL_TABLET | Freq: Four times a day (QID) | ORAL | 0 refills | Status: AC | PRN
Start: 2022-01-04 — End: 2022-01-09

## 2022-01-04 MED ORDER — SUCRALFATE 1 GM/10ML PO SUSP
1.0000 g | Freq: Three times a day (TID) | ORAL | Status: DC
  Administered 2022-01-04: 1 g via ORAL
  Filled 2022-01-04 (×2): qty 10

## 2022-01-04 MED ORDER — PROPOFOL 10 MG/ML IV BOLUS
INTRAVENOUS | Status: DC | PRN
  Administered 2022-01-04: 250 mg via INTRAVENOUS

## 2022-01-04 NOTE — Transfer of Care (Signed)
Immediate Anesthesia Transfer of Care Note ? ?Patient: Chelsey Sullivan ? ?Procedure(s) Performed: ESOPHAGOGASTRODUODENOSCOPY (EGD) WITH PROPOFOL ? ?Patient Location: PACU ? ?Anesthesia Type:General ? ?Level of Consciousness: awake and oriented ? ?Airway & Oxygen Therapy: Patient Spontanous Breathing ? ?Post-op Assessment: Report given to RN and Post -op Vital signs reviewed and stable ? ?Post vital signs: Reviewed and stable ? ?Last Vitals:  ?Vitals Value Taken Time  ?BP    ?Temp    ?Pulse    ?Resp    ?SpO2    ? ? ?Last Pain:  ?Vitals:  ? 01/04/22 0750  ?TempSrc:   ?PainSc: 3   ?   ? ?Patients Stated Pain Goal: 0 (01/03/22 1737) ? ?Complications: No notable events documented. ?

## 2022-01-04 NOTE — Anesthesia Postprocedure Evaluation (Signed)
Anesthesia Post Note ? ?Patient: Chelsey Sullivan ? ?Procedure(s) Performed: ESOPHAGOGASTRODUODENOSCOPY (EGD) WITH PROPOFOL ? ?Patient location during evaluation: PACU ?Anesthesia Type: General ?Level of consciousness: awake and alert ?Pain management: pain level controlled ?Vital Signs Assessment: post-procedure vital signs reviewed and stable ?Respiratory status: spontaneous breathing, nonlabored ventilation, respiratory function stable and patient connected to nasal cannula oxygen ?Cardiovascular status: blood pressure returned to baseline and stable ?Postop Assessment: no apparent nausea or vomiting ?Anesthetic complications: no ? ? ?No notable events documented. ? ? ?Last Vitals:  ?Vitals:  ? 01/04/22 0459 01/04/22 0950  ?BP: 110/71 139/84  ?Pulse: 65 73  ?Resp: 18 (!) 24  ?Temp: 36.6 ?C 36.7 ?C  ?SpO2: 97% 97%  ?  ?Last Pain:  ?Vitals:  ? 01/04/22 0750  ?TempSrc:   ?PainSc: 3   ? ? ?  ?  ?  ?  ?  ?  ? ?Windell Norfolk ? ? ? ? ?

## 2022-01-04 NOTE — Interval H&P Note (Signed)
History and Physical Interval Note: ? ?01/04/2022 ?10:06 AM ? ?Chelsey Sullivan  has presented today for surgery, with the diagnosis of Dysphagia.  The various methods of treatment have been discussed with the patient and family. After consideration of risks, benefits and other options for treatment, the patient has consented to  Procedure(s): ?ESOPHAGOGASTRODUODENOSCOPY (EGD) WITH PROPOFOL (N/A) as a surgical intervention.  The patient's history has been reviewed, patient examined, no change in status, stable for surgery.  I have reviewed the patient's chart and labs.  Questions were answered to the patient's satisfaction.   ? ? ?Lanelle Bal ? ? ?

## 2022-01-04 NOTE — Anesthesia Preprocedure Evaluation (Signed)
Anesthesia Evaluation  ?Patient identified by MRN, date of birth, ID band ?Patient awake ? ? ? ?Reviewed: ?Allergy & Precautions, H&P , NPO status , Patient's Chart, lab work & pertinent test results, reviewed documented beta blocker date and time  ? ?Airway ?Mallampati: II ? ?TM Distance: >3 FB ?Neck ROM: full ? ? ? Dental ?no notable dental hx. ? ?  ?Pulmonary ?neg pulmonary ROS, Current Smoker,  ?  ?Pulmonary exam normal ?breath sounds clear to auscultation ? ? ? ? ? ? Cardiovascular ?Exercise Tolerance: Good ?negative cardio ROS ? ? ?Rhythm:regular Rate:Normal ? ? ?  ?Neuro/Psych ?Seizures -,  PSYCHIATRIC DISORDERS Depression   ? GI/Hepatic ?GERD  Medicated,(+)  ?  ? substance abuse ? cocaine use,   ?Endo/Other  ?Morbid obesity ? Renal/GU ?negative Renal ROS  ?negative genitourinary ?  ?Musculoskeletal ? ? Abdominal ?  ?Peds ? Hematology ? ?(+) Blood dyscrasia, anemia ,   ?Anesthesia Other Findings ? ? Reproductive/Obstetrics ?negative OB ROS ? ?  ? ? ? ? ? ? ? ? ? ? ? ? ? ?  ?  ? ? ? ? ? ? ? ? ?Anesthesia Physical ?Anesthesia Plan ? ?ASA: 3 and emergent ? ?Anesthesia Plan: General  ? ?Post-op Pain Management:   ? ?Induction:  ? ?PONV Risk Score and Plan: Propofol infusion ? ?Airway Management Planned:  ? ?Additional Equipment:  ? ?Intra-op Plan:  ? ?Post-operative Plan:  ? ?Informed Consent: I have reviewed the patients History and Physical, chart, labs and discussed the procedure including the risks, benefits and alternatives for the proposed anesthesia with the patient or authorized representative who has indicated his/her understanding and acceptance.  ? ? ? ?Dental Advisory Given ? ?Plan Discussed with: CRNA ? ?Anesthesia Plan Comments:   ? ? ? ? ? ? ?Anesthesia Quick Evaluation ? ?

## 2022-01-04 NOTE — Op Note (Signed)
[facility] ?Patient Name: Chelsey Sullivan ?Procedure Date: 01/04/2022 9:54 AM ?MRN: [id number] ?Date of Birth: 11/30/1985 ?Attending MD: Elon Alas. Abbey Chatters , DO ?CSN: [id number] ?Age: 36 ?Admit Type: Inpatient ?Procedure:                Upper GI endoscopy ?Indications:              Dysphagia, Nausea ?Providers:                Elon Alas. Abbey Chatters, DO, Lurline Del, RN, Ulice Dash  ?                          Blima Singer, Technician ?Referring MD:              ?Medicines:                See the Anesthesia note for documentation of the  ?                          administered medications ?Complications:            No immediate complications. ?Estimated Blood Loss:     Estimated blood loss was minimal. ?Procedure:                Pre-Anesthesia Assessment: ?                          - The anesthesia plan was to use monitored  ?                          anesthesia care (MAC). ?                          After obtaining informed consent, the endoscope was  ?                          passed under direct vision. Throughout the  ?                          procedure, the patient's blood pressure, pulse, and  ?                          oxygen saturations were monitored continuously. The  ?                          GIF-H190 ([id number]) scope was introduced through the  ?                          mouth, and advanced to the second part of duodenum.  ?                          The upper GI endoscopy was accomplished without  ?                          difficulty. The patient tolerated the procedure  ?                          well. ?Scope In: 10:23:23 AM ?  Scope Out: 10:28:22 AM ?Total Procedure Duration: 0 hours 4 minutes 59 seconds  ?Findings: ?     Black hairy tongue. ?     There is no endoscopic evidence of areas of erosion, esophagitis,  ?     ulcerations or varices in the entire esophagus. ?     A mild Schatzki ring was found in the distal esophagus. A TTS dilator  ?     was passed through the scope. Dilation with an 18-19-20 mm  balloon  ?     dilator was performed to 20 mm. The dilation site was examined and  ?     showed moderate improvement in luminal narrowing. ?     Patchy mild inflammation characterized by erosions and erythema was  ?     found in the gastric body and in the gastric antrum. Biopsies were taken  ?     with a cold forceps for Helicobacter pylori testing. ?     The duodenal bulb, first portion of the duodenum and second portion of  ?     the duodenum were normal. ?Impression:               - Black hairy tongue. ?                          - Mild Schatzki ring. Dilated. ?                          - Gastritis. Biopsied. ?                          - Normal duodenal bulb, first portion of the  ?                          duodenum and second portion of the duodenum. ?Moderate Sedation: ?     Per Anesthesia Care ?Recommendation:           - Return patient to hospital ward for ongoing care. ?                          - Soft diet. ?                          - Use Protonix (pantoprazole) 40 mg PO BID x12 weeks ?                          - Use sucralfate tablets 1 gram PO QID for 2 weeks. ?                          - Needs better oral hygeine. Brush tongue, avoid  ?                          tobacco ?                          - I will follow up on gastric biopsies and treat  ?                          for H pylori if positive. ?                          -  Avoid NSAIDs ?                          - Zofran as needed ?                          - Outpatient GI follow up ?Procedure Code(s):        --- Professional --- ?                          [phone], Esophagogastroduodenoscopy, flexible,  ?                          transoral; with transendoscopic balloon dilation of  ?                          esophagus (less than 30 mm diameter) ?                          43239, 59, Esophagogastroduodenoscopy, flexible,  ?                          transoral; with biopsy, single or multiple ?Diagnosis Code(s):        --- Professional --- ?                           K22.2, Esophageal obstruction ?                          K29.70, Gastritis, unspecified, without bleeding ?                          R13.10, Dysphagia, unspecified ?                          R11.0, Nausea ?CPT copyright 2019 [employer]. All rights reserved. ?The codes documented in this report are preliminary and upon coder review may  ?be revised to meet current compliance requirements. ?Elon Alas. Abbey Chatters, DO ?Elon Alas. [place], DO ?01/04/2022 10:38:55 AM ?This report has been signed electronically. ?Number of Addenda: 0 ?

## 2022-01-04 NOTE — Discharge Summary (Signed)
?Physician Discharge Summary ?  ?Patient: Chelsey Sullivan MRN: [id number] DOB: March 27, 1986  ?Admit date:     12/29/2021  ?Discharge date: 01/04/22  ?Discharge Physician: Barton Dubois  ? ?PCP: Patient, No Pcp Per (Inactive)  ? ?Recommendations at discharge:  ?Repeat basic metabolic panel to follow electrolytes and renal function ?Make sure patient follow-up with pulmonologist to further evaluate lung nodules and complete work-up with PET scan and he required bronchoscopy. ?Make sure patient has follow-up with gastroenterology service as instructed. ?-Continue assisting patient with tobacco cessation and stopping recreational drugs. ?Assist patient with weight management. ? ?Discharge Diagnoses: ?Principal Problem: ?  Pulmonary nodules/lesions, multiple ?Active Problems: ?  Shortness of breath ?  Gastroesophageal reflux disease ?  Esophageal dysphagia ?  Cavitary lesion of lung ?  Class 1 obesity ?  Febrile illness ? ?Hospital Course: ?Chelsey Sullivan is a 36 y.o. female with a history of seizure disorder, depression, iron deficiency anemia, cocaine use remotely who presented to the ED 5/8 with mid-chest pain after a trip and fall. She reported recent fatigue, poor sleep. CT scan of the chest showed multiple irregular densities noted throughout both lungs many of which are cavitary and the largest measures approximately 3.3 cm.  She has extensive mediastinal adenopathy as well as enlarged adenopathy in the porta hepatis region of the abdomen.  Her densities may represent septic emboli or multifocal infection they are concerning for metastatic disease given the extensive adenopathy as described CT scan of the abdomen and pelvis is recommended for further evaluation per radiology. ?  ?IV antibiotics started, quantiferon gold and blood cultures sent and the patient was admitted for further work up with pulmonary consultation. ? ?Assessment and Plan: ?Bilateral pulmonary densities/fever: cavitary lesions, extensive mediastinal  adenopathy. Pathologically enlarged upper abdominal lymph nodes up to 4cm. Nonspecific cervical lymphadenopathy. Splenomegaly, hepatomegaly. No convincing evidence of Lemierre's disease on CTA of the neck. There is paranasal sinus fluid, though pt doesn't have signs or symptoms of acute sinusitis at this time. Pt has some nonspecific constitutional symptoms, though fever curve (Tmax 99.1?F) and WBC (normal at 6.4k) at time of admission are reassuring.  ?-Currently afebrile. ?- DDx to consider includes septic emboli and metastatic malignancy of unclear primary. Also need to consider fungal infection, auto-immune, and arboviruses.  ?- Echocardiogram is largely unremarkable, including no vegetations noted on TTE. ?- Pt has history of incarceration, PPD and QuantiFERON gold for tuberculosis negative; airborne precaution has been discontinued. ?- Appreciate pulmonary consultation. They have recommended outpatient follow up with plans for PET-CT after discharge and trial of empiric steroids; given concern for vasculitis/collagen disorder.  W ?-Patient with positive rheumatoid factor, elevated CRP and ESR.   ?-Following recommendations by pulmonologist head MRI has been ordered; no acute intracranial abnormalities appreciated. ?-Physical therapy has seen patient and recommended home health PT and rolling walker at discharge.  Given history of cocaine use home health agencies has denied to provide home treatments; DME will be provided and outpatient PT will be arranged as backup plan. ?-QuantiFERON gold Ehrlichia titers and [facility] fever results are negative. ?-Also negative cryptococcal antigen, Blastomyces antigen and Lyme disease serology. ?-ANCA profile negative ?- Note pt was evaluated in ED 11/27/2021 complaining of tick bite. This broadens differential somewhat, though there are not the typical features of tick-borne illnesses, specifically no rash appreciated. ?-Blood cultures demonstrating 1 out of 4 staph  argues capitis suggesting contaminant. ?-Complete treatment with oral antibiotics as prescribed.  -Patient has remained afebrile and feeling ready to go home. ?  ?  History of cocaine use: No evidence of IVDU at this time on exam.  ?-HIV is nonreactive. ?-UDS positive for cocaine and opiates. ?-Cessation counseling provided. ?-Outpatient resources provided. ?  ?Chest pain: Due to trip/fall. No significant traumatic complications on CT's.  ?-Cardiac enzymes negative, EKG, 2D echo and telemetry reassuring and ruling out cardiac etiology. ?-Continue supportive care and as needed analgesics. ?  ?Obesity: Estimated body mass index is 35.16 kg/m? as calculated from the following: ?  Height as of this encounter: $RemoveBeforeD'5\' 8"'[id number]$  (1.727 m). ?  Weight as of this encounter: 104.9 kg. ?-Low calorie diet, portion control and increase physical activity discussed with patient. ?  ?GERD/difficulty swallowing solids ?-Continue PPI ?-Appreciate GI service assistance and recommendation. ?-Status post endoscopic evaluation demonstrating Schatzki ring with mild stricture, gastritis and esophagitis.  Biopsies were taken, dilatation provided and instructions to use PPI twice a day for 12 weeks and Carafate for 1 week provided. ?-Avoid the use of NSAIDs and tobacco abuse. ? ?Consultants: Pulmonology and GI service ?Procedures performed: See below for x-ray reports. ?Disposition: Home with outpatient physical therapy referral. ?Diet recommendation: Heart healthy/low calorie diet. ? ?DISCHARGE MEDICATION: ?Allergies as of 01/04/2022   ? ?   Reactions  ? Amoxicillin Rash  ? Penicillins Rash  ? Has patient had a PCN reaction causing immediate rash, facial/tongue/throat swelling, SOB or lightheadedness with hypotension: Yes ?Has patient had a PCN reaction causing severe rash involving mucus membranes or skin necrosis: No ?Has patient had a PCN reaction that required hospitalization No ?Has patient had a PCN reaction occurring within the last 10 years:  Yes ?If all of the above answers are "NO", then may proceed with Cephalosporin use.  ? ?  ? ?  ?Medication List  ?  ? ?STOP taking these medications   ? ?doxycycline 100 MG capsule ?Commonly known as: VIBRAMYCIN ?  ?methocarbamol 500 MG tablet ?Commonly known as: ROBAXIN ?  ? ?  ? ?TAKE these medications   ? ?acetaminophen 500 MG tablet ?Commonly known as: TYLENOL ?Take 1,000 mg by mouth every 6 (six) hours as needed for mild pain. ?  ?albuterol 108 (90 Base) MCG/ACT inhaler ?Commonly known as: VENTOLIN HFA ?Inhale 2 puffs into the lungs every 6 (six) hours as needed for wheezing or shortness of breath. ?  ?levETIRAcetam 500 MG tablet ?Commonly known as: KEPPRA ?Take 1 tablet (500 mg total) by mouth 2 (two) times daily. ?What changed: Another medication with the same name was removed. Continue taking this medication, and follow the directions you see here. ?  ?pantoprazole 40 MG tablet ?Commonly known as: PROTONIX ?Take 1 tablet by mouth twice a day for 12 weeks; then start using 1 tablet by mouth daily. ?  ?predniSONE 10 MG tablet ?Commonly known as: DELTASONE ?Take 6 tablets by mouth daily x1 day; then 5 tablet by mouth daily x1 day; then 4 tablets by mouth daily x2 days; then 3 tablets by mouth daily x2 days; then 2 tablets by mouth daily x3 days; then 1 tablet by mouth daily x3 days and stop prednisone. ?  ?sucralfate 1 GM/10ML suspension ?Commonly known as: CARAFATE ?Take 10 mLs (1 g total) by mouth 4 (four) times daily -  with meals and at bedtime for 7 days. ?  ?sulfamethoxazole-trimethoprim 800-160 MG tablet ?Commonly known as: BACTRIM DS ?Take 1 tablet by mouth every 12 (twelve) hours for 5 days. ?  ?traMADol 50 MG tablet ?Commonly known as: Ultram ?Take 1 tablet (50 mg total) by mouth every 6 (  six) hours as needed for up to 5 days. ?  ? ?  ? ?  ?  ? ? ?  ?Durable Medical Equipment  ?(From admission, onward)  ?  ? ? ?  ? ?  Start     Ordered  ? 01/01/22 1741  For home use only DME Walker rolling  Once        ?Question Answer Comment  ?Walker: With 5 Inch Wheels   ?Patient needs a walker to treat with the following condition Physical deconditioning   ?  ? 01/01/22 1740  ? ?  ?  ? ?  ? ? Follow-up Information   ? ?

## 2022-01-06 ENCOUNTER — Encounter (HOSPITAL_COMMUNITY): Payer: Self-pay | Admitting: Internal Medicine

## 2022-01-07 ENCOUNTER — Telehealth: Payer: Self-pay | Admitting: Internal Medicine

## 2022-01-07 ENCOUNTER — Other Ambulatory Visit: Payer: Self-pay | Admitting: Internal Medicine

## 2022-01-07 DIAGNOSIS — A048 Other specified bacterial intestinal infections: Secondary | ICD-10-CM

## 2022-01-07 LAB — SURGICAL PATHOLOGY

## 2022-01-07 MED ORDER — CLARITHROMYCIN 500 MG PO TABS: 500.0000 mg | ORAL_TABLET | Freq: Two times a day (BID) | ORAL | 0 refills | Status: AC

## 2022-01-07 MED ORDER — METRONIDAZOLE 500 MG PO TABS: 500.0000 mg | ORAL_TABLET | Freq: Three times a day (TID) | ORAL | 0 refills | Status: DC

## 2022-01-07 MED ORDER — METRONIDAZOLE 500 MG PO TABS: 500.0000 mg | ORAL_TABLET | Freq: Three times a day (TID) | ORAL | 0 refills | Status: AC

## 2022-01-07 NOTE — Telephone Encounter (Signed)
Stacey please arrange follow up visit with Dr. Marletta Lor or app in 2 months ? ? ?Phoned and advise the pt of her result note , medication sent in and it's directions and when to start it. Pt expressed understanding ?

## 2022-01-07 NOTE — Telephone Encounter (Signed)
Please let patient know her gastric biopsies positive for H. pylori.  I will send her in 2 antibiotics, clarithromycin twice daily x14 days and metronidazole 3 times daily x14 days.  Continue on pantoprazole 40 mg twice daily. ? ?She should wait to start the antibiotics until she completes her course of Bactrim. ? ?Please arrange follow-up visit with app or myself and 2 months to discuss eradication testing.  Thank you ? ? ?

## 2022-01-08 ENCOUNTER — Encounter: Payer: Self-pay | Admitting: Internal Medicine

## 2022-01-16 ENCOUNTER — Other Ambulatory Visit: Payer: Self-pay

## 2022-01-16 ENCOUNTER — Emergency Department (HOSPITAL_COMMUNITY)
Admission: EM | Admit: 2022-01-16 | Discharge: 2022-01-16 | Discharge status: Against Medical Advice | Payer: Medicaid Other | Attending: Emergency Medicine | Admitting: Emergency Medicine

## 2022-01-16 ENCOUNTER — Emergency Department (HOSPITAL_COMMUNITY): Payer: Medicaid Other

## 2022-01-16 ENCOUNTER — Encounter (HOSPITAL_COMMUNITY): Payer: Self-pay

## 2022-01-16 DIAGNOSIS — R059 Cough, unspecified: Secondary | ICD-10-CM | POA: Diagnosis not present

## 2022-01-16 DIAGNOSIS — Z5321 Procedure and treatment not carried out due to patient leaving prior to being seen by health care provider: Secondary | ICD-10-CM | POA: Insufficient documentation

## 2022-01-16 DIAGNOSIS — R079 Chest pain, unspecified: Secondary | ICD-10-CM | POA: Insufficient documentation

## 2022-01-16 DIAGNOSIS — R112 Nausea with vomiting, unspecified: Secondary | ICD-10-CM | POA: Diagnosis not present

## 2022-01-16 DIAGNOSIS — M546 Pain in thoracic spine: Secondary | ICD-10-CM | POA: Insufficient documentation

## 2022-01-16 LAB — CBC
HCT: 44.2 % (ref 36.0–46.0)
Hemoglobin: 14 g/dL (ref 12.0–15.0)
MCH: 28.7 pg (ref 26.0–34.0)
MCHC: 31.7 g/dL (ref 30.0–36.0)
MCV: 90.8 fL (ref 80.0–100.0)
Platelets: 268 10*3/uL (ref 150–400)
RBC: 4.87 MIL/uL (ref 3.87–5.11)
RDW: 13.5 % (ref 11.5–15.5)
WBC: 6.4 10*3/uL (ref 4.0–10.5)
nRBC: 0 % (ref 0.0–0.2)

## 2022-01-16 LAB — BASIC METABOLIC PANEL
Anion gap: 6 (ref 5–15)
BUN: 10 mg/dL (ref 6–20)
CO2: 23 mmol/L (ref 22–32)
Calcium: 9.6 mg/dL (ref 8.9–10.3)
Chloride: 109 mmol/L (ref 98–111)
Creatinine, Ser: 0.78 mg/dL (ref 0.44–1.00)
GFR, Estimated: >60 mL/min (ref >60)
Glucose, Bld: 105 mg/dL — ABNORMAL HIGH (ref 70–99)
Potassium: 4 mmol/L (ref 3.5–5.1)
Sodium: 138 mmol/L (ref 135–145)

## 2022-01-16 LAB — TROPONIN I (HIGH SENSITIVITY): Troponin I (High Sensitivity): <2 ng/L (ref <18)

## 2022-01-16 NOTE — ED Notes (Signed)
No longer present in lobby.  

## 2022-01-16 NOTE — ED Triage Notes (Signed)
Pt c/o central chest pain radiating into mid back, n/v, and cough starting last night.  Pain score 9/10.  Pt reports pain increases w/ lying down.

## 2022-01-26 NOTE — Progress Notes (Signed)
Spoke to patient during hospital discharge follow-up call from hospitalization discharge on 5/14. Patient stated she did not have pulmonogist follow-up appointment scheduled. Called Dr. Gustavus Bryant office and scheduled an appointment for Thursday, June 8th at [facility]. Patient understands to arrive 15 minutes early and was able to secure transportation.

## 2022-01-28 NOTE — Progress Notes (Deleted)
Chelsey Sullivan, female    DOB: 09-18-1985   MRN: [id number]   Brief patient profile:  ***  *** referred to pulmonary clinic 01/29/2022 by *** for ***       Admit date:     12/29/2021  Discharge date: 01/04/22  Discharge Physician: Barton Dubois    PCP: Patient, No Pcp Per (Inactive)    Recommendations at discharge:  Repeat basic metabolic panel to follow electrolytes and renal function Make sure patient follow-up with pulmonologist to further evaluate lung nodules and complete work-up with PET scan and he required bronchoscopy. Make sure patient has follow-up with gastroenterology service as instructed. -Continue assisting patient with tobacco cessation and stopping recreational drugs. Assist patient with weight management.   Discharge Diagnoses: Principal Problem:   Pulmonary nodules/lesions, multiple Active Problems:   Shortness of breath   Gastroesophageal reflux disease   Esophageal dysphagia   Cavitary lesion of lung   Class 1 obesity   Febrile illness   Hospital Course: Chelsey Sullivan is a 36 y.o. female with a history of seizure disorder, depression, iron deficiency anemia, cocaine use remotely who presented to the ED 5/8 with mid-chest pain after a trip and fall. She reported recent fatigue, poor sleep. CT scan of the chest showed multiple irregular densities noted throughout both lungs many of which are cavitary and the largest measures approximately 3.3 cm.  She has extensive mediastinal adenopathy as well as enlarged adenopathy in the porta hepatis region of the abdomen.  Her densities may represent septic emboli or multifocal infection they are concerning for metastatic disease given the extensive adenopathy as described CT scan of the abdomen and pelvis is recommended for further evaluation per radiology.   IV antibiotics started, quantiferon gold and blood cultures sent and the patient was admitted for further work up with pulmonary consultation.   Assessment and  Plan: Bilateral pulmonary densities/fever: cavitary lesions, extensive mediastinal adenopathy. Pathologically enlarged upper abdominal lymph nodes up to 4cm. Nonspecific cervical lymphadenopathy. Splenomegaly, hepatomegaly. No convincing evidence of Lemierre's disease on CTA of the neck. There is paranasal sinus fluid, though pt doesn't have signs or symptoms of acute sinusitis at this time. Pt has some nonspecific constitutional symptoms, though fever curve (Tmax 99.60F) and WBC (normal at 6.4k) at time of admission are reassuring.  -Currently afebrile. - DDx to consider includes septic emboli and metastatic malignancy of unclear primary. Also need to consider fungal infection, auto-immune, and arboviruses.  - Echocardiogram is largely unremarkable, including no vegetations noted on TTE. - Pt has history of incarceration, PPD and QuantiFERON gold for tuberculosis negative; airborne precaution has been discontinued. - Appreciate pulmonary consultation. They have recommended outpatient follow up with plans for PET-CT after discharge and trial of empiric steroids; given concern for vasculitis/collagen disorder.  W -Patient with positive rheumatoid factor, elevated CRP and ESR.   -Following recommendations by pulmonologist head MRI has been ordered; no acute intracranial abnormalities appreciated. -Physical therapy has seen patient and recommended home health PT and rolling walker at discharge.  Given history of cocaine use home health agencies has denied to provide home treatments; DME will be provided and outpatient PT will be arranged as backup plan. -QuantiFERON gold Ehrlichia titers and [facility] fever results are negative. -Also negative cryptococcal antigen, Blastomyces antigen and Lyme disease serology. -ANCA profile negative - Note pt was evaluated in ED 11/27/2021 complaining of tick bite. This broadens differential somewhat, though there are not the typical features of tick-borne illnesses,  specifically no rash appreciated. -Blood  cultures demonstrating 1 out of 4 staph argues capitis suggesting contaminant. -Complete treatment with oral antibiotics as prescribed.  -Patient has remained afebrile and feeling ready to go home.   History of cocaine use: No evidence of IVDU at this time on exam.  -HIV is nonreactive. -UDS positive for cocaine and opiates. -Cessation counseling provided. -Outpatient resources provided.   Chest pain: Due to trip/fall. No significant traumatic complications on CT's.  -Cardiac enzymes negative, EKG, 2D echo and telemetry reassuring and ruling out cardiac etiology. -Continue supportive care and as needed analgesics.   Obesity: Estimated body mass index is 35.16 kg/m as calculated from the following:   Height as of this encounter: 5' 8" (1.727 m).   Weight as of this encounter: 104.9 kg. -Low calorie diet, portion control and increase physical activity discussed with patient.   GERD/difficulty swallowing solids -Continue PPI -Appreciate GI service assistance and recommendation. -Status post endoscopic evaluation demonstrating Schatzki ring with mild stricture, gastritis and esophagitis.  Biopsies were taken, dilatation provided and instructions to use PPI twice a day for 12 weeks and Carafate for 1 week provided. -Avoid the use of NSAIDs and tobacco abuse.      History of Present Illness  01/29/2022  Pulmonary/ 1st office eval/[facility]  No chief complaint on file.    Dyspnea:  *** Cough: *** Sleep: *** SABA use:   Past Medical History:  Diagnosis Date   Cocaine abuse ([place])    Depression    Epilepsy ([place])     Outpatient Medications Prior to Visit  Medication Sig Dispense Refill   acetaminophen (TYLENOL) 500 MG tablet Take 1,000 mg by mouth every 6 (six) hours as needed for mild pain.     albuterol (VENTOLIN HFA) 108 (90 Base) MCG/ACT inhaler Inhale 2 puffs into the lungs every 6 (six) hours as needed for wheezing or shortness of breath.  (Patient not taking: Reported on 12/29/2021) 18 g 1   levETIRAcetam (KEPPRA) 500 MG tablet Take 1 tablet (500 mg total) by mouth 2 (two) times daily. (Patient not taking: Reported on 08/04/2019) 60 tablet 6   pantoprazole (PROTONIX) 40 MG tablet Take 1 tablet by mouth twice a day for 12 weeks; then start using 1 tablet by mouth daily. 60 tablet 3   predniSONE (DELTASONE) 10 MG tablet Take 6 tablets by mouth daily x1 day; then 5 tablet by mouth daily x1 day; then 4 tablets by mouth daily x2 days; then 3 tablets by mouth daily x2 days; then 2 tablets by mouth daily x3 days; then 1 tablet by mouth daily x3 days and stop prednisone. 33 tablet 0   sucralfate (CARAFATE) 1 GM/10ML suspension Take 10 mLs (1 g total) by mouth 4 (four) times daily -  with meals and at bedtime for 7 days. 280 mL 0   No facility-administered medications prior to visit.     Objective:     LMP  (LMP Unknown)          Assessment   No problem-specific Assessment & Plan notes found for this encounter.     Christinia Gully, MD 01/28/2022

## 2022-01-29 ENCOUNTER — Ambulatory Visit (HOSPITAL_COMMUNITY): Payer: Medicaid Other | Attending: Internal Medicine

## 2022-01-29 ENCOUNTER — Inpatient Hospital Stay: Payer: Medicaid Other | Admitting: Internal Medicine

## 2022-01-29 NOTE — Therapy (Incomplete)
OUTPATIENT PHYSICAL THERAPY LOWER EXTREMITY EVALUATION   Patient Name: Chelsey Sullivan MRN: [id number] DOB:08-26-1985, 36 y.o., female Today's Date: 01/29/2022    Past Medical History:  Diagnosis Date   Cocaine abuse (HCC)    Depression    Epilepsy (HCC)    Past Surgical History:  Procedure Laterality Date   ESOPHAGOGASTRODUODENOSCOPY (EGD) WITH PROPOFOL N/A 01/04/2022   Procedure: ESOPHAGOGASTRODUODENOSCOPY (EGD) WITH PROPOFOL;  Surgeon: Lanelle Bal, DO;  Location: [facility];  Service: Endoscopy;  Laterality: N/A;   HERNIA REPAIR     umbilical hernia repair x 2.   Patient Active Problem List   Diagnosis Date Noted   Cavitary lesion of lung    Class 1 obesity    Febrile illness    Gastroesophageal reflux disease    Esophageal dysphagia    Shortness of breath 12/29/2021   Pulmonary nodules/lesions, multiple 12/29/2021   Seizures (HCC) 06/25/2019   Urinary tract infection 06/25/2019   Cocaine use 06/25/2019   Acute encephalopathy 06/25/2019   Intractable seizures (HCC) 06/24/2019   Epilepsy (HCC)    Depression    Noncompliance w/medication treatment due to intermit use of medication    Supervision of normal pregnancy 01/23/2019   Late prenatal care in third trimester 01/23/2019   Grand multiparity 01/23/2019   Anemia, iron deficiency 11/04/2016   Seizure disorder during pregnancy in third trimester (HCC) 11/03/2016    PCP: ***  REFERRING PROVIDER: ***  REFERRING DIAG: R91.8 (ICD-10-CM) - Pulmonary nodules/lesions, multiple R06.02 (ICD-10-CM) - Shortness of breath   THERAPY DIAG:  No diagnosis found.  Rationale for Evaluation and Treatment {HABREHAB:27488}  ONSET DATE: ***  SUBJECTIVE:   SUBJECTIVE STATEMENT: ***  PERTINENT HISTORY: history of seizure disorder, depression, iron deficiency anemia, cocaine use remotely who presented to the ED 5/8 with mid-chest pain after a trip and fall.   CT scan of the chest showed multiple irregular densities  noted throughout both lungs many of which are cavitary and the largest measures approximately 3.3 cm.  She has extensive mediastinal adenopathy as well as enlarged adenopathy in the porta hepatis region of the abdomen.  Her densities may represent septic emboli or multifocal infection they are concerning for metastatic disease given the extensive adenopathy as described CT scan of the abdomen and pelvis is recommended for further evaluation per radiology.   PAIN:  Are you having pain? {OPRCPAIN:27236}  PRECAUTIONS: {Therapy precautions:24002}  WEIGHT BEARING RESTRICTIONS {Yes ***/No:24003}  FALLS:  Has patient fallen in last 6 months? {fallsyesno:27318}  LIVING ENVIRONMENT: Lives with: {[facility] lives with:25569::"lives with their family"} Lives in: {Lives in:25570} Stairs: {opstairs:27293} Has following equipment at home: {Assistive devices:23999}  OCCUPATION: ***  PLOF: {PLOF:24004}  PATIENT GOALS ***   OBJECTIVE:   DIAGNOSTIC FINDINGS: ***  PATIENT SURVEYS:  {rehab surveys:24030}  COGNITION:  Overall cognitive status: {cognition:24006}     SENSATION: {sensation:27233}  EDEMA:  {edema:24020}  MUSCLE LENGTH: Hamstrings: Right *** deg; Left *** deg Thomas test: Right *** deg; Left *** deg  POSTURE: {posture:25561}  PALPATION: ***  LOWER EXTREMITY ROM:  {AROM/PROM:27142} ROM Right eval Left eval  Hip flexion    Hip extension    Hip abduction    Hip adduction    Hip internal rotation    Hip external rotation    Knee flexion    Knee extension    Ankle dorsiflexion    Ankle plantarflexion    Ankle inversion    Ankle eversion     (Blank rows = not tested)  LOWER EXTREMITY  MMT:  MMT Right eval Left eval  Hip flexion    Hip extension    Hip abduction    Hip adduction    Hip internal rotation    Hip external rotation    Knee flexion    Knee extension    Ankle dorsiflexion    Ankle plantarflexion    Ankle inversion    Ankle eversion     (Blank  rows = not tested)  LOWER EXTREMITY SPECIAL TESTS:  {LEspecialtests:26242}  FUNCTIONAL TESTS:  {Functional tests:24029}  GAIT: Distance walked: *** Assistive device utilized: {Assistive devices:23999} Level of assistance: {Levels of assistance:24026} Comments: ***    TODAY'S TREATMENT: ***   PATIENT EDUCATION:  Education details: *** Person educated: {Person educated:25204} Education method: {Education Method:25205} Education comprehension: {Education Comprehension:25206}   HOME EXERCISE PROGRAM: ***  ASSESSMENT:  CLINICAL IMPRESSION: Patient is a *** y.o. *** who was seen today for physical therapy evaluation and treatment for ***.    OBJECTIVE IMPAIRMENTS {opptimpairments:25111}.   ACTIVITY LIMITATIONS {activitylimitations:27494}  PARTICIPATION LIMITATIONS: {participationrestrictions:25113}  PERSONAL FACTORS {Personal factors:25162} are also affecting patient's functional outcome.   REHAB POTENTIAL: {rehabpotential:25112}  CLINICAL DECISION MAKING: {clinical decision making:25114}  EVALUATION COMPLEXITY: {Evaluation complexity:25115}   GOALS: Goals reviewed with patient? {yes/no:20286}  SHORT TERM GOALS: Target date: {follow up:25551}  *** Baseline: Goal status: {GOALSTATUS:25110}  2.  *** Baseline:  Goal status: {GOALSTATUS:25110}  3.  *** Baseline:  Goal status: {GOALSTATUS:25110}  4.  *** Baseline:  Goal status: {GOALSTATUS:25110}  5.  *** Baseline:  Goal status: {GOALSTATUS:25110}  6.  *** Baseline:  Goal status: {GOALSTATUS:25110}  LONG TERM GOALS: Target date: {follow up:25551}   *** Baseline:  Goal status: {GOALSTATUS:25110}  2.  *** Baseline:  Goal status: {GOALSTATUS:25110}  3.  *** Baseline:  Goal status: {GOALSTATUS:25110}  4.  *** Baseline:  Goal status: {GOALSTATUS:25110}  5.  *** Baseline:  Goal status: {GOALSTATUS:25110}  6.  *** Baseline:  Goal status: {GOALSTATUS:25110}   PLAN: PT FREQUENCY:  {rehab frequency:25116}  PT DURATION: {rehab duration:25117}  PLANNED INTERVENTIONS: {rehab planned interventions:25118::"Therapeutic exercises","Therapeutic activity","Neuromuscular re-education","Balance training","Gait training","Patient/Family education","Joint mobilization"}  PLAN FOR NEXT SESSION: ***   Kimbree Casanas, PT 01/29/2022, 7:22 AM

## 2022-02-04 ENCOUNTER — Encounter (HOSPITAL_COMMUNITY): Payer: Self-pay | Admitting: *Deleted

## 2022-02-04 ENCOUNTER — Other Ambulatory Visit: Payer: Self-pay

## 2022-02-04 ENCOUNTER — Emergency Department (HOSPITAL_COMMUNITY): Payer: Medicaid Other

## 2022-02-04 ENCOUNTER — Emergency Department (HOSPITAL_COMMUNITY)
Admission: EM | Admit: 2022-02-04 | Discharge: 2022-02-04 | Disposition: A | Discharge status: Home / Self Care | Payer: Medicaid Other | Attending: Student | Admitting: Student

## 2022-02-04 DIAGNOSIS — R109 Unspecified abdominal pain: Secondary | ICD-10-CM | POA: Diagnosis not present

## 2022-02-04 DIAGNOSIS — N3000 Acute cystitis without hematuria: Secondary | ICD-10-CM

## 2022-02-04 DIAGNOSIS — M545 Low back pain, unspecified: Secondary | ICD-10-CM | POA: Diagnosis present

## 2022-02-04 LAB — CBC WITH DIFFERENTIAL/PLATELET
Abs Immature Granulocytes: 0.02 10*3/uL (ref 0.00–0.07)
Basophils Absolute: 0 10*3/uL (ref 0.0–0.1)
Basophils Relative: 1 %
Eosinophils Absolute: 0.1 10*3/uL (ref 0.0–0.5)
Eosinophils Relative: 3 %
HCT: 37.4 % (ref 36.0–46.0)
Hemoglobin: 11.8 g/dL — ABNORMAL LOW (ref 12.0–15.0)
Immature Granulocytes: 0 %
Lymphocytes Relative: 13 %
Lymphs Abs: 0.6 10*3/uL — ABNORMAL LOW (ref 0.7–4.0)
MCH: 28.8 pg (ref 26.0–34.0)
MCHC: 31.6 g/dL (ref 30.0–36.0)
MCV: 91.2 fL (ref 80.0–100.0)
Monocytes Absolute: 0.4 10*3/uL (ref 0.1–1.0)
Monocytes Relative: 7 %
Neutro Abs: 3.7 10*3/uL (ref 1.7–7.7)
Neutrophils Relative %: 76 %
Platelets: 254 10*3/uL (ref 150–400)
RBC: 4.1 MIL/uL (ref 3.87–5.11)
RDW: 14.2 % (ref 11.5–15.5)
WBC: 4.8 10*3/uL (ref 4.0–10.5)
nRBC: 0 % (ref 0.0–0.2)

## 2022-02-04 LAB — COMPREHENSIVE METABOLIC PANEL
ALT: 19 U/L (ref 0–44)
AST: 24 U/L (ref 15–41)
Albumin: 3.1 g/dL — ABNORMAL LOW (ref 3.5–5.0)
Alkaline Phosphatase: 85 U/L (ref 38–126)
Anion gap: 9 (ref 5–15)
BUN: 12 mg/dL (ref 6–20)
CO2: 21 mmol/L — ABNORMAL LOW (ref 22–32)
Calcium: 8.8 mg/dL — ABNORMAL LOW (ref 8.9–10.3)
Chloride: 105 mmol/L (ref 98–111)
Creatinine, Ser: 0.92 mg/dL (ref 0.44–1.00)
GFR, Estimated: >60 mL/min (ref >60)
Glucose, Bld: 99 mg/dL (ref 70–99)
Potassium: 3.8 mmol/L (ref 3.5–5.1)
Sodium: 135 mmol/L (ref 135–145)
Total Bilirubin: 0.4 mg/dL (ref 0.3–1.2)
Total Protein: 7.2 g/dL (ref 6.5–8.1)

## 2022-02-04 LAB — URINALYSIS, ROUTINE W REFLEX MICROSCOPIC
Bilirubin Urine: NEGATIVE
Glucose, UA: NEGATIVE mg/dL
Hgb urine dipstick: NEGATIVE
Ketones, ur: 5 mg/dL — AB
Nitrite: POSITIVE — AB
Protein, ur: NEGATIVE mg/dL
Specific Gravity, Urine: 1.026 (ref 1.005–1.030)
pH: 5 (ref 5.0–8.0)

## 2022-02-04 LAB — PREGNANCY, URINE: Preg Test, Ur: NEGATIVE

## 2022-02-04 MED ORDER — IOHEXOL 300 MG/ML  SOLN
100.0000 mL | Freq: Once | INTRAMUSCULAR | Status: AC | PRN
  Administered 2022-02-04: 100 mL via INTRAVENOUS

## 2022-02-04 MED ORDER — SULFAMETHOXAZOLE-TRIMETHOPRIM 800-160 MG PO TABS: 1.0000 | ORAL_TABLET | Freq: Two times a day (BID) | ORAL | 0 refills | Status: AC

## 2022-02-04 NOTE — Discharge Instructions (Addendum)
Take the entire course of the antibiotics you have been prescribed.  Keep your appointment with the lung specialist on June 28 as you have arranged.  You are also being referred to Dr. Pricilla Holm who is a gynecologist who can help determine if the lung and liver problems are related to a female cancer such as uterine cancer.  Call her office for an appointment.  I also recommend that you see Dr. Ellin Saba here at this hospital, he is a cancer specialist.  Call for an appointment.  I have forwarded your information to him as well.

## 2022-02-04 NOTE — ED Notes (Signed)
Patient transported to X-ray 

## 2022-02-04 NOTE — ED Provider Notes (Signed)
Patient presenting with right sided lower back pain which has been present for the past month.  She was recently admitted to the hospital where she was found to have pulmonary lesions and concern for metastatic disease of unknown differentiated primary source.  She is currently being evaluated as an outpatient stating she has been seen by gastroenterology, her next appointment is with pulmonology, she states this had to be rescheduled and is planning to see the lung specialist in about 2 weeks.     Her CT imaging today does not reveal source of her low back pain, her lumbar spine is negative for metastatic disease.  She does have some fluid and edema of the endometrium raising clinical concern for endometrial cancer as primary source.  Additionally her urinalysis definitely shows that she has a urinary tract infection which could explain her immediate symptoms.  She will be placed on antibiotics.  She was encouraged to keep her appointment with pulmonology.  She will also benefit from a referral to gyn/oncology.  I spoke with Dr. Pricilla Holm regarding this who will review her chart and patient will be referred to her clinic.  She has requested that she have a pelvic ultrasound.  I explained to her that we cannot get that this evening at [facility] but will order as an outpatient.  She also recommended referral to medical oncology as well, I will refer her to Dr. Ellin Saba for further management of her multiple findings.     Results for orders placed or performed during the hospital encounter of 02/04/22  Urinalysis, Routine w reflex microscopic  Result Value Ref Range   Color, Urine YELLOW YELLOW   APPearance HAZY (A) CLEAR   Specific Gravity, Urine 1.026 1.005 - 1.030   pH 5.0 5.0 - 8.0   Glucose, UA NEGATIVE NEGATIVE mg/dL   Hgb urine dipstick NEGATIVE NEGATIVE   Bilirubin Urine NEGATIVE NEGATIVE   Ketones, ur 5 (A) NEGATIVE mg/dL   Protein, ur NEGATIVE NEGATIVE mg/dL   Nitrite POSITIVE (A)  NEGATIVE   Leukocytes,Ua TRACE (A) NEGATIVE   RBC / HPF 0-5 0 - 5 RBC/hpf   WBC, UA 0-5 0 - 5 WBC/hpf   Bacteria, UA MANY (A) NONE SEEN   Squamous Epithelial / LPF 11-20 0 - 5   Mucus PRESENT   Pregnancy, urine  Result Value Ref Range   Preg Test, Ur NEGATIVE NEGATIVE  CBC with Differential  Result Value Ref Range   WBC 4.8 4.0 - 10.5 K/uL   RBC 4.10 3.87 - 5.11 MIL/uL   Hemoglobin 11.8 (L) 12.0 - 15.0 g/dL   HCT 01.0 27.2 - 53.6 %   MCV 91.2 80.0 - 100.0 fL   MCH 28.8 26.0 - 34.0 pg   MCHC 31.6 30.0 - 36.0 g/dL   RDW 64.4 03.4 - 74.2 %   Platelets 254 150 - 400 K/uL   nRBC 0.0 0.0 - 0.2 %   Neutrophils Relative % 76 %   Neutro Abs 3.7 1.7 - 7.7 K/uL   Lymphocytes Relative 13 %   Lymphs Abs 0.6 (L) 0.7 - 4.0 K/uL   Monocytes Relative 7 %   Monocytes Absolute 0.4 0.1 - 1.0 K/uL   Eosinophils Relative 3 %   Eosinophils Absolute 0.1 0.0 - 0.5 K/uL   Basophils Relative 1 %   Basophils Absolute 0.0 0.0 - 0.1 K/uL   Immature Granulocytes 0 %   Abs Immature Granulocytes 0.02 0.00 - 0.07 K/uL  Comprehensive metabolic panel  Result Value  Ref Range   Sodium 135 135 - 145 mmol/L   Potassium 3.8 3.5 - 5.1 mmol/L   Chloride 105 98 - 111 mmol/L   CO2 21 (L) 22 - 32 mmol/L   Glucose, Bld 99 70 - 99 mg/dL   BUN 12 6 - 20 mg/dL   Creatinine, Ser 4.090.92 0.44 - 1.00 mg/dL   Calcium 8.8 (L) 8.9 - 10.3 mg/dL   Total Protein 7.2 6.5 - 8.1 g/dL   Albumin 3.1 (L) 3.5 - 5.0 g/dL   AST 24 15 - 41 U/L   ALT 19 0 - 44 U/L   Alkaline Phosphatase 85 38 - 126 U/L   Total Bilirubin 0.4 0.3 - 1.2 mg/dL   GFR, Estimated >81>60 >19>60 mL/min   Anion gap 9 5 - 15   CT ABDOMEN PELVIS W CONTRAST  Result Date: 02/04/2022 CLINICAL DATA:  Right lower back and flank pain, suspected metastatic malignancy with pulmonary nodules and cavitary lung lesions, abdominal lymphadenopathy * Tracking Code: BO * EXAM: CT ABDOMEN AND PELVIS WITH CONTRAST CT LUMBAR SPINE WITH CONTRAST TECHNIQUE: Multidetector CT imaging of  the abdomen and pelvis was performed using the standard protocol following bolus administration of intravenous contrast. Multidetector CT imaging of the lumbar spine was performed using the standard protocol following bolus administration of intravenous contrast. RADIATION DOSE REDUCTION: This exam was performed according to the departmental dose-optimization program which includes automated exposure control, adjustment of the mA and/or kV according to patient size and/or use of iterative reconstruction technique. CONTRAST:  100mL OMNIPAQUE IOHEXOL 300 MG/ML  SOLN COMPARISON:  CT chest abdomen pelvis, 12/29/2021 FINDINGS: CT ABDOMEN PELVIS FINDINGS Lower chest: Multiple spiculated appearing masses and nodules throughout the included lower lungs, several of the largest of which are cavitary and not significantly changed compared to prior examination dated 12/29/2021. Hepatobiliary: Multiple subtle, ill-defined liver lesions, for example adjacent to the vena cava, hepatic segment V/VI, measuring approximately 1.8 x 1.2 cm (series 504, image 21) and a lesion of the hepatic dome, segment VII, measuring 4.0 x 4.0 cm (series 504, image 9). No gallstones, gallbladder wall thickening, or biliary dilatation. Pancreas: Unremarkable. No pancreatic ductal dilatation or surrounding inflammatory changes. Spleen: Normal in size without significant abnormality. Adrenals/Urinary Tract: Adrenal glands are unremarkable. Kidneys are normal, without renal calculi, solid lesion, or hydronephrosis. Bladder is unremarkable. Stomach/Bowel: Stomach is within normal limits. Appendix appears normal. No evidence of bowel wall thickening, distention, or inflammatory changes. Vascular/Lymphatic: No significant vascular findings are present. Bulky porta hepatis, portacaval, retroperitoneal, and gastrohepatic ligament lymphadenopathy, unchanged compared to prior examination, largest portacaval node measuring 3.8 x 2.6 cm (series 504, image 32).  Additional prominent pelvic sidewall, iliac, and inguinal lymph nodes, largest left pelvic sidewall node measuring 3.2 x 1.2 cm (series 504, image 77). Reproductive: Fluid and or endometrial thickening in the endometrial cavity (series 507, image 65). Other: No abdominal wall hernia or abnormality. No ascites. Musculoskeletal: No acute or significant osseous findings. CT LUMBAR SPINE FINDINGS Alignment: Normal lumbar lordosis. Vertebral bodies: Intact.  No suspicious osseous lesions. Disc spaces: Intact. Paraspinous soft tissues: Unremarkable. IMPRESSION: 1. Multiple spiculated appearing masses and nodules throughout the included lower lungs, several of the largest of which are cavitary and not significantly changed, highly suspicious for pulmonary metastatic disease. 2. Bulky abdominal and pelvic lymphadenopathy, unchanged compared to prior examination, most consistent with nodal metastatic disease. 3. Multiple subtle, ill-defined liver lesions, incompletely characterized although highly suspicious for metastatic disease. These are less clearly appreciated on prior examination,  appearance possibly very sensitive to exact phase of contrast administration. Contrast enhanced MRI could be used to more clearly characterize liver lesions if desired. 4. Fluid and or endometrial thickening in the endometrial cavity. This is at least a potential etiology of primary malignancy. Consider pelvic ultrasound to further evaluate. 5. No fracture or dislocation of the lumbar spine. No suspicious osseous lesions by CT. No findings to explain pain. Electronically Signed   By: Jearld Lesch M.D.   On: 02/04/2022 19:09   CT L-SPINE NO CHARGE  Result Date: 02/04/2022 CLINICAL DATA:  Right lower back and flank pain, suspected metastatic malignancy with pulmonary nodules and cavitary lung lesions, abdominal lymphadenopathy * Tracking Code: BO * EXAM: CT ABDOMEN AND PELVIS WITH CONTRAST CT LUMBAR SPINE WITH CONTRAST TECHNIQUE:  Multidetector CT imaging of the abdomen and pelvis was performed using the standard protocol following bolus administration of intravenous contrast. Multidetector CT imaging of the lumbar spine was performed using the standard protocol following bolus administration of intravenous contrast. RADIATION DOSE REDUCTION: This exam was performed according to the departmental dose-optimization program which includes automated exposure control, adjustment of the mA and/or kV according to patient size and/or use of iterative reconstruction technique. CONTRAST:  OMNIPAQUE IOHEXOL 300 MG/ML  SOLN COMPARISON:  CT chest abdomen pelvis, 12/29/2021 FINDINGS: CT ABDOMEN PELVIS FINDINGS Lower chest: Multiple spiculated appearing masses and nodules throughout the included lower lungs, several of the largest of which are cavitary and not significantly changed compared to prior examination dated 12/29/2021. Hepatobiliary: Multiple subtle, ill-defined liver lesions, for example adjacent to the vena cava, hepatic segment V/VI, measuring approximately 1.8 x 1.2 cm (series 504, image 21) and a lesion of the hepatic dome, segment VII, measuring 4.0 x 4.0 cm (series 504, image 9). No gallstones, gallbladder wall thickening, or biliary dilatation. Pancreas: Unremarkable. No pancreatic ductal dilatation or surrounding inflammatory changes. Spleen: Normal in size without significant abnormality. Adrenals/Urinary Tract: Adrenal glands are unremarkable. Kidneys are normal, without renal calculi, solid lesion, or hydronephrosis. Bladder is unremarkable. Stomach/Bowel: Stomach is within normal limits. Appendix appears normal. No evidence of bowel wall thickening, distention, or inflammatory changes. Vascular/Lymphatic: No significant vascular findings are present. Bulky porta hepatis, portacaval, retroperitoneal, and gastrohepatic ligament lymphadenopathy, unchanged compared to prior examination, largest portacaval node measuring 3.8 x 2.6 cm  (series 504, image 32). Additional prominent pelvic sidewall, iliac, and inguinal lymph nodes, largest left pelvic sidewall node measuring 3.2 x 1.2 cm (series 504, image 77). Reproductive: Fluid and or endometrial thickening in the endometrial cavity (series 507, image 65). Other: No abdominal wall hernia or abnormality. No ascites. Musculoskeletal: No acute or significant osseous findings. CT LUMBAR SPINE FINDINGS Alignment: Normal lumbar lordosis. Vertebral bodies: Intact.  No suspicious osseous lesions. Disc spaces: Intact. Paraspinous soft tissues: Unremarkable. IMPRESSION: 1. Multiple spiculated appearing masses and nodules throughout the included lower lungs, several of the largest of which are cavitary and not significantly changed, highly suspicious for pulmonary metastatic disease. 2. Bulky abdominal and pelvic lymphadenopathy, unchanged compared to prior examination, most consistent with nodal metastatic disease. 3. Multiple subtle, ill-defined liver lesions, incompletely characterized although highly suspicious for metastatic disease. These are less clearly appreciated on prior examination, appearance possibly very sensitive to exact phase of contrast administration. Contrast enhanced MRI could be used to more clearly characterize liver lesions if desired. 4. Fluid and or endometrial thickening in the endometrial cavity. This is at least a potential etiology of primary malignancy. Consider pelvic ultrasound to further evaluate. 5. No fracture or  dislocation of the lumbar spine. No suspicious osseous lesions by CT. No findings to explain pain. Electronically Signed   By: Jearld Lesch M.D.   On: 02/04/2022 19:09   DG Chest 2 View  Result Date: 02/04/2022 CLINICAL DATA:  Back and chest pain.  Recent diagnosis of pneumonia. EXAM: CHEST - 2 VIEW COMPARISON:  Chest two views 01/16/2022, 12/29/2021; CT chest 12/29/2021; CT abdomen and pelvis 12/29/2021 FINDINGS: Cardiac silhouette and mediastinal contours  are within normal limits. There again patchy and nodular opacities within the right mid lung, inferior medial right lung, and left mid and lower lung diffusely. Some of these are slightly more well-defined and this may be secondary to evolution of the numerous cavitary lesion seen on prior CT. No definite pleural effusion is seen. No pneumothorax. No acute skeletal abnormality. IMPRESSION: Redemonstration of bilateral nodular and irregular densities predominantly within the left mid and lower lung and right mid lung. These were seen to be cavitary on prior CT. Differential considerations again include septic emboli, other multifocal infection,. Note is again made of mediastinal, bilateral hilar, and porta hepatis lymphadenopathy on the 12/29/2021 CT which raises suspicion for metastatic disease. Recommend continued attention on follow-up. Note is made of 12/29/2021 CT abdomen and pelvis Impression: "Follow-up PET-CT and biopsy as warranted should be considered." Electronically Signed   By: Neita Garnet M.D.   On: 02/04/2022 15:49   DG Chest 2 View  Result Date: 01/16/2022 CLINICAL DATA:  Chest pain and cough for 1 day EXAM: CHEST - 2 VIEW COMPARISON:  12/29/2021 chest radiograph and CT chest exams FINDINGS: Normal heart size pulmonary vascularity. Mild enlargement of the pulmonary hila, corresponding to adenopathy on prior CT. Patchy somewhat nodular appearing opacities throughout both lungs similar to previous exam. Cavitary foci seen on prior CT resolved. No new areas of consolidation, pleural effusion, or pneumothorax. Osseous structures unremarkable. IMPRESSION: Patchy nodular appearing opacities throughout both lungs with resolution of cavitary foci identified on prior CT. Findings are concerning for septic emboli, though metastatic disease not completely excluded. Hilar enlargement question reactive adenopathy. Continued follow-up until resolution recommended. Electronically Signed   By: Ulyses Southward M.D.    On: 01/16/2022 16:15       Burgess Amor, Cordelia Poche 02/05/22 0029    Pricilla Loveless, MD 02/05/22 2129

## 2022-02-04 NOTE — ED Provider Notes (Signed)
[facility] Provider Note   CSN: [id number] Arrival date &[facility] time: 02/04/22  1449     History Chief Complaint  Patient presents with   Back Pain    C[facility] is a 36 y.o. female with history of epilepsy, depression, noncompliance, cocaine use, pulmonary nodules presents the emergency department for evaluation of right-sided lower back pain for the past month.  Patient reports that her pain started whenever she was discharged from the hospital although she reports it may have started before then.  She denies any urinary fecal incontinence, urinary retention, saddle anesthesia, fevers, or IV drug use.  She was recently admitted for 6 days a month ago for pulmonary nodules/lesions/cavitary lesions with thoughts of possible metastases.  She was post to follow-up with pulmonology for PET scan, however has not received one yet and has had to reschedule her appointment.  She denies any urinary urgency, frequency, dysuria, hematuria or any abdominal pain.  She denies any nausea or vomiting.  Denies any weakness in her legs.  No medications trialed prior to arrival.  Patient reports that her pain worsened yesterday so that so she decided to come in today.  She denies any falls or traumas to the area although she reports that she fell in early May.   Back Pain Associated symptoms: no abdominal pain, no chest pain, no dysuria, no fever, no numbness and no weakness        Home Medications Prior to Admission medications   Medication Sig Start Date End Date Taking? Authorizing Provider  acetaminophen (TYLENOL) 500 MG tablet Take 1,000 mg by mouth every 6 (six) hours as needed for mild pain.    [provider]  albuterol (VENTOLIN HFA) 108 (90 Base) MCG/ACT inhaler Inhale 2 puffs into the lungs every 6 (six) hours as needed for wheezing or shortness of breath. Patient not taking: Reported on 12/29/2021 06/26/19   Shon HaleEmokpae, Courage, MD  levETIRAcetam (KEPPRA) 500 MG tablet  Take 1 tablet (500 mg total) by mouth 2 (two) times daily. Patient not taking: Reported on 08/04/2019 06/26/19   Shon HaleEmokpae, Courage, MD  pantoprazole (PROTONIX) 40 MG tablet Take 1 tablet by mouth twice a day for 12 weeks; then start using 1 tablet by mouth daily. 01/04/22   Vassie LollMadera, Carlos, MD  predniSONE (DELTASONE) 10 MG tablet Take 6 tablets by mouth daily x1 day; then 5 tablet by mouth daily x1 day; then 4 tablets by mouth daily x2 days; then 3 tablets by mouth daily x2 days; then 2 tablets by mouth daily x3 days; then 1 tablet by mouth daily x3 days and stop prednisone. 01/04/22   Vassie LollMadera, Carlos, MD  sucralfate (CARAFATE) 1 GM/10ML suspension Take 10 mLs (1 g total) by mouth 4 (four) times daily -  with meals and at bedtime for 7 days. 01/04/22 01/11/22  Vassie LollMadera, Carlos, MD  ferrous sulfate 325 (65 FE) MG tablet Take 1 tablet (325 mg total) by mouth 2 (two) times daily with a meal. Patient not taking: Reported on 08/04/2019 06/26/19 10/15/19  Shon HaleEmokpae, Courage, MD      Allergies    Amoxicillin and Penicillins    Review of Systems   Review of Systems  Constitutional:  Negative for chills and fever.  Respiratory:  Negative for cough and shortness of breath.   Cardiovascular:  Negative for chest pain.  Gastrointestinal:  Negative for abdominal pain, nausea and vomiting.  Genitourinary:  Negative for dysuria, frequency, hematuria, urgency and vaginal discharge.  Musculoskeletal:  Positive for  back pain.  Neurological:  Negative for weakness and numbness.    Physical Exam Updated Vital Signs BP 119/83 (BP Location: Right Arm)   Pulse 87   Temp 98 F (36.7 C) (Oral)   Resp 20   Ht 5\' 11"  (1.803 m)   Wt 104.3 kg   LMP 12/16/2021 Comment: Negative preg in ED 02/04/22. BTF  SpO2 96%   BMI 32.08 kg/m  Physical Exam Vitals and nursing note reviewed.  Constitutional:      General: She is not in acute distress.    Appearance: Normal appearance. She is not toxic-appearing.  HENT:     Head:  Normocephalic and atraumatic.  Eyes:     General: No scleral icterus. Cardiovascular:     Rate and Rhythm: Normal rate and regular rhythm.  Pulmonary:     Effort: Pulmonary effort is normal. No respiratory distress.     Breath sounds: Normal breath sounds.  Abdominal:     General: Abdomen is flat. Bowel sounds are normal.     Palpations: Abdomen is soft.     Tenderness: There is no abdominal tenderness. There is no guarding or rebound.  Musculoskeletal:        General: No deformity.       Arms:     Cervical back: Normal range of motion.     Comments: No midline thoracic, or lumbar tenderness.  No increased erythema.  No step-offs or deformities.  Patient has some tenderness to the left side of her lower back.  Area marked that is tender to palpation.  No induration or fluctuance.  No overlying rash.  Strength is 5 out of 5 in patient's lower extremities.  Sensation intact.  Compartments are soft.  Palpable pulses.  Skin:    General: Skin is warm and dry.  Neurological:     General: No focal deficit present.     Mental Status: She is alert. Mental status is at baseline.     ED Results / Procedures / Treatments   Labs (all labs ordered are listed, but only abnormal results are displayed) Labs Reviewed  URINALYSIS, ROUTINE W REFLEX MICROSCOPIC - Abnormal; Notable for the following components:      Result Value   APPearance HAZY (*)    Ketones, ur 5 (*)    Nitrite POSITIVE (*)    Leukocytes,Ua TRACE (*)    Bacteria, UA MANY (*)    All other components within normal limits  CBC WITH DIFFERENTIAL/PLATELET - Abnormal; Notable for the following components:   Hemoglobin 11.8 (*)    Lymphs Abs 0.6 (*)    All other components within normal limits  COMPREHENSIVE METABOLIC PANEL - Abnormal; Notable for the following components:   CO2 21 (*)    Calcium 8.8 (*)    Albumin 3.1 (*)    All other components within normal limits  URINE CULTURE  PREGNANCY, URINE     EKG None  Radiology DG Chest 2 View  Result Date: 02/04/2022 CLINICAL DATA:  Back and chest pain.  Recent diagnosis of pneumonia. EXAM: CHEST - 2 VIEW COMPARISON:  Chest two views 01/16/2022, 12/29/2021; CT chest 12/29/2021; CT abdomen and pelvis 12/29/2021 FINDINGS: Cardiac silhouette and mediastinal contours are within normal limits. There again patchy and nodular opacities within the right mid lung, inferior medial right lung, and left mid and lower lung diffusely. Some of these are slightly more well-defined and this may be secondary to evolution of the numerous cavitary lesion seen on prior CT. No definite  pleural effusion is seen. No pneumothorax. No acute skeletal abnormality. IMPRESSION: Redemonstration of bilateral nodular and irregular densities predominantly within the left mid and lower lung and right mid lung. These were seen to be cavitary on prior CT. Differential considerations again include septic emboli, other multifocal infection,. Note is again made of mediastinal, bilateral hilar, and porta hepatis lymphadenopathy on the 12/29/2021 CT which raises suspicion for metastatic disease. Recommend continued attention on follow-up. Note is made of 12/29/2021 CT abdomen and pelvis Impression: "Follow-up PET-CT and biopsy as warranted should be considered." Electronically Signed   By: Neita Garnet M.D.   On: 02/04/2022 15:49    Procedures Procedures   Medications Ordered in ED Medications - No data to display  ED Course/ Medical Decision Making/ A&P                           Medical Decision Making Amount and/or Complexity of Data Reviewed Labs: ordered. Radiology: ordered.  Risk Prescription drug management.   36 year old female presents the emergency room for evaluation of left-sided back pain that has been lasting for over a month but gradually worsened last night.  Differential diagnosis includes but is not limited to malignancy, metastatic disease, UTI, kidney stone,  sciatica, MSK, lumbar sprain, lumbar strain, fracture.  Vital signs are unremarkable.  Patient normotensive, afebrile, normal pulse rate, satting well room air without increased work of breathing.  Physical exam is pertinent for well-appearing patient in no acute distress. No midline thoracic, or lumbar tenderness.  No increased erythema.  No step-offs or deformities.  Patient has some tenderness to the left side of her lower back.  Area marked that is tender to palpation.  No induration or fluctuance.  No overlying rash.  Strength is 5 out of 5 in patient's lower extremities.  Sensation intact.  Compartments are soft.  Palpable pulses.  On prior chart evaluation, patient was admitted on 12-29-2021 through 01-04-2022 for cavitary lesions of the lung with possible metastatic disease.  Patient was has a follow-up with a PET scan for possible density although she keeps postponing.  Given the concern for possible malignancy, will order CT imaging of the abdomen pelvis and lumbar to rule out any malignancy causing her pain.  I independently reviewed and interpreted the patient's labs.  BC shows mild decrease in hemoglobin 11.8 although patient's recent hemoglobins have been different at 14, 13, and 12.  Normal platelets.  No leukocytosis.  CMP shows mild decrease in bicarb at 21.  Mild decrease in calcium at 8.8.  Mild increase in albumin at 3.1.  Otherwise, no electrolyte or LFT abnormalities.  Urinalysis shows hazy urine with 5 ketones is positive for nitrites with trace leukocytes.  There is 0-5 white blood cells however but many bacteria.  There is limited 20 squamous epithelial cells present.  The patient is not complaining of any UTI symptoms although she does have lower left back pain.  Given this, will place her on an antibiotic for UTI prophylactically.  Urine culture obtained.  Although the patient is on any chest pain or any shortness of breath, did obtain chest x-ray given patient's history.  Impression  reads as: Redemonstration of bilateral nodular and irregular densities predominantly within the left mid and lower lung and right mid lung. These were seen to be cavitary on prior CT. Differential considerations again include septic emboli, other multifocal infection,. Note is again made of mediastinal, bilateral hilar, and porta hepatis lymphadenopathy on the  12/29/2021 CT which raises suspicion for metastatic disease. Recommend continued attention on follow-up. Note is made of 12/29/2021 CT abdomen and pelvis Impression: "Follow-up PET-CT and biopsy as warranted should be considered."  CT imaging is pending at this time.  I discussed this case with my attending physician who cosigned this note including patient's presenting symptoms, physical exam, and planned diagnostics and interventions. Attending physician stated agreement with plan or made changes to plan which were implemented.   6:55 PM [facility] transferred to PA Raynelle Fanning Idol at the end of my shift as the patient will require reassessment once labs/imaging have resulted. Patient presentation, ED course, and plan of care discussed with review of all pertinent labs and imaging. Please see his/her note for further details regarding further ED course and disposition. Plan at time of handoff is follow up with imaging. If discharged, will need antibiotic for UTI. This may be altered or completely changed at the discretion of the oncoming team pending results of further workup.  Final Clinical Impression(s) / ED Diagnoses Final diagnoses:  None    Rx / DC Orders ED Discharge Orders     None         Achille Rich, Cordelia Poche 02/04/22 1906    Pricilla Loveless, MD 02/05/22 2127

## 2022-02-04 NOTE — ED Triage Notes (Signed)
Pt with continued right lower back that radiates up to chest per pt. Recent dx'd with PNA per pt. +N/V, denies fevers or diarrhea.

## 2022-02-04 NOTE — ED Notes (Signed)
Patient transported to CT 

## 2022-02-05 ENCOUNTER — Telehealth (HOSPITAL_COMMUNITY): Payer: Self-pay | Admitting: Student

## 2022-02-05 MED ORDER — HYDROCODONE-ACETAMINOPHEN 5-325 MG PO TABS: 2.0000 | ORAL_TABLET | ORAL | 0 refills | Status: DC | PRN

## 2022-02-05 NOTE — Telephone Encounter (Signed)
Patient seen yesterday in the emergency department for evaluation of abdominal and flank pain.  Work-up concerning for UTI and possible endometrial malignancy.  Patient calling back to the emergency department as she was not discharged on any pain medication and her pain is out of control.  I will provide a short course of Norco given patient's persistent pain with malignancy.

## 2022-02-06 ENCOUNTER — Other Ambulatory Visit (HOSPITAL_COMMUNITY): Payer: Self-pay

## 2022-02-06 ENCOUNTER — Inpatient Hospital Stay (HOSPITAL_COMMUNITY): Payer: Medicaid Other | Attending: Hematology | Admitting: Hematology

## 2022-02-06 ENCOUNTER — Inpatient Hospital Stay (HOSPITAL_COMMUNITY): Payer: Medicaid Other

## 2022-02-06 DIAGNOSIS — F17210 Nicotine dependence, cigarettes, uncomplicated: Secondary | ICD-10-CM | POA: Diagnosis not present

## 2022-02-06 DIAGNOSIS — R591 Generalized enlarged lymph nodes: Secondary | ICD-10-CM | POA: Diagnosis not present

## 2022-02-06 DIAGNOSIS — Z79899 Other long term (current) drug therapy: Secondary | ICD-10-CM | POA: Diagnosis not present

## 2022-02-06 DIAGNOSIS — M545 Low back pain, unspecified: Secondary | ICD-10-CM | POA: Diagnosis not present

## 2022-02-06 DIAGNOSIS — F1290 Cannabis use, unspecified, uncomplicated: Secondary | ICD-10-CM | POA: Insufficient documentation

## 2022-02-06 DIAGNOSIS — Z7952 Long term (current) use of systemic steroids: Secondary | ICD-10-CM | POA: Insufficient documentation

## 2022-02-06 DIAGNOSIS — F1490 Cocaine use, unspecified, uncomplicated: Secondary | ICD-10-CM | POA: Insufficient documentation

## 2022-02-06 DIAGNOSIS — Z8042 Family history of malignant neoplasm of prostate: Secondary | ICD-10-CM | POA: Insufficient documentation

## 2022-02-06 DIAGNOSIS — R918 Other nonspecific abnormal finding of lung field: Secondary | ICD-10-CM | POA: Diagnosis not present

## 2022-02-06 LAB — COMPREHENSIVE METABOLIC PANEL
ALT: 24 U/L (ref 0–44)
AST: 35 U/L (ref 15–41)
Albumin: 3.5 g/dL (ref 3.5–5.0)
Alkaline Phosphatase: 98 U/L (ref 38–126)
Anion gap: 10 (ref 5–15)
BUN: 7 mg/dL (ref 6–20)
CO2: 24 mmol/L (ref 22–32)
Calcium: 9.1 mg/dL (ref 8.9–10.3)
Chloride: 102 mmol/L (ref 98–111)
Creatinine, Ser: 0.85 mg/dL (ref 0.44–1.00)
GFR, Estimated: >60 mL/min (ref >60)
Glucose, Bld: 85 mg/dL (ref 70–99)
Potassium: 3.8 mmol/L (ref 3.5–5.1)
Sodium: 136 mmol/L (ref 135–145)
Total Bilirubin: 0.6 mg/dL (ref 0.3–1.2)
Total Protein: 8 g/dL (ref 6.5–8.1)

## 2022-02-06 LAB — URINE CULTURE: Culture: >=100000 — AB

## 2022-02-06 LAB — RAPID URINE DRUG SCREEN, HOSP PERFORMED
Amphetamines: NOT DETECTED
Barbiturates: NOT DETECTED
Benzodiazepines: NOT DETECTED
Cocaine: POSITIVE — AB
Opiates: POSITIVE — AB
Tetrahydrocannabinol: POSITIVE — AB

## 2022-02-06 LAB — CBC WITH DIFFERENTIAL/PLATELET
Abs Immature Granulocytes: 0.01 10*3/uL (ref 0.00–0.07)
Basophils Absolute: 0.1 10*3/uL (ref 0.0–0.1)
Basophils Relative: 1 %
Eosinophils Absolute: 0.1 10*3/uL (ref 0.0–0.5)
Eosinophils Relative: 2 %
HCT: 41.7 % (ref 36.0–46.0)
Hemoglobin: 13.4 g/dL (ref 12.0–15.0)
Immature Granulocytes: 0 %
Lymphocytes Relative: 12 %
Lymphs Abs: 0.6 10*3/uL — ABNORMAL LOW (ref 0.7–4.0)
MCH: 28.9 pg (ref 26.0–34.0)
MCHC: 32.1 g/dL (ref 30.0–36.0)
MCV: 89.9 fL (ref 80.0–100.0)
Monocytes Absolute: 0.3 10*3/uL (ref 0.1–1.0)
Monocytes Relative: 6 %
Neutro Abs: 3.8 10*3/uL (ref 1.7–7.7)
Neutrophils Relative %: 79 %
Platelets: 277 10*3/uL (ref 150–400)
RBC: 4.64 MIL/uL (ref 3.87–5.11)
RDW: 14.3 % (ref 11.5–15.5)
WBC: 4.8 10*3/uL (ref 4.0–10.5)
nRBC: 0 % (ref 0.0–0.2)

## 2022-02-06 LAB — LACTATE DEHYDROGENASE: LDH: 203 U/L — ABNORMAL HIGH (ref 98–192)

## 2022-02-06 MED ORDER — TRAMADOL HCL 50 MG PO TABS: 50.0000 mg | ORAL_TABLET | Freq: Two times a day (BID) | ORAL | 0 refills | Status: AC | PRN

## 2022-02-06 NOTE — Progress Notes (Signed)
[facility] [address] [place] [address]   CLINIC:  Medical Oncology/Hematology  CONSULT NOTE  Patient Care Team: Patient, No Pcp Per as PCP - General (General Practice) Derek Jack, MD as Medical Oncologist (Medical Oncology) Brien Mates, RN as Oncology Nurse Navigator (Medical Oncology)  CHIEF COMPLAINTS/PURPOSE OF CONSULTATION:  Evaluation for lung masses and generalized lymphadenopathy  HISTORY OF PRESENTING ILLNESS:  [name] 36 y.o. female is here because of evaluation for lung masses and generalized lymphadenopathy, at the request of the ED.  Today she reports feeling well. She reports her baseline weight is 240, and she has lost 28 lbs in the past 2 months. She denies intentional weight loss. She reports certain foods such as meat cause vomiting. She also reports nausea. She denies fevers and night sweats. She reports a cough productive of yellow sputum which developed since 5/8 following her hospitalization; she reports 1 episode of hemoptysis this morning where she has a small amount of blood in the yellow sputum. Her last menses was 1 month ago; they occur monthly for 4-5 days of light bleeding. She denies spotting. She reports lower back pain over the past 2 months, and she reports leg pains below the knee which started about 2 weeks ago. She reports 1 episode of hematochezia and constipation starting yesterday. She denies CP. She reports occasional ankle swellings.    She is a stay-at-home mom. She smokes 1 ppd and has smoked since she was 36 years old. She denies cocaine use. Her father had prostate cancer, and her maternal grandmother had cancer of an unknown type. Her maternal aunt had sarcoidosis.   MEDICAL HISTORY:  Past Medical History:  Diagnosis Date   Cocaine abuse ([place])    Depression    Epilepsy ([place])     SURGICAL HISTORY: Past Surgical History:  Procedure Laterality Date   ESOPHAGOGASTRODUODENOSCOPY (EGD) WITH  PROPOFOL N/A 01/04/2022   Procedure: ESOPHAGOGASTRODUODENOSCOPY (EGD) WITH PROPOFOL;  Surgeon: Eloise Harman, DO;  Location: [facility];  Service: Endoscopy;  Laterality: N/A;   HERNIA REPAIR     umbilical hernia repair x 2.    SOCIAL HISTORY: Social History   Socioeconomic History   Marital status: Legally Separated    Spouse name: Jonathon   Number of children: 6   Years of education: Not on file   Highest education level: Not on file  Occupational History   Occupation: unemployed  Tobacco Use   Smoking status: Every Day    Packs/day: 1.00    Types: Cigarettes   Smokeless tobacco: Never  Vaping Use   Vaping Use: Never used  Substance and Sexual Activity   Alcohol use: Yes    Comment: occ   Drug use: Not Currently    Types: Cocaine   Sexual activity: Yes    Birth control/protection: None  Other Topics Concern   Not on file  Social History Narrative   Not on file   Social Determinants of Health   Financial Resource Strain: Low Risk  (06/24/2019)   Overall Financial Resource Strain (CARDIA)    Difficulty of Paying Living Expenses: Not very hard  Food Insecurity: Unknown (06/24/2019)   Hunger Vital Sign    Worried About [place] in the Last Year: Patient refused    [place] in the Last Year: Patient refused  Transportation Needs: Unknown (06/24/2019)   PRAPARE - Hydrologist (Medical): Patient refused  Lack of Transportation (Non-Medical): Patient refused  Physical Activity: Unknown (06/24/2019)   Exercise Vital Sign    Days of Exercise per Week: Patient refused    Minutes of Exercise per Session: Patient refused  Stress: Not on file  Social Connections: Unknown (06/24/2019)   Social Connection and Isolation Panel [NHANES]    Frequency of Communication with Friends and Family: Patient refused    Frequency of Social Gatherings with Friends and Family: Patient refused    Attends Religious Services: Patient  refused    Active Member of Clubs or Organizations: Patient refused    Attends Banker Meetings: Patient refused    Marital Status: Patient refused  Intimate Partner Violence: Not At Risk (06/24/2019)   Humiliation, Afraid, Rape, and Kick questionnaire    Fear of Current or Ex-Partner: No    Emotionally Abused: No    Physically Abused: No    Sexually Abused: No    FAMILY HISTORY: Family History  Problem Relation Age of Onset   Hypertension Other    Diabetes Other    Epilepsy Other    Seizures Sister    Hypertension Maternal Grandmother    Kidney disease Maternal Grandmother     ALLERGIES:  is allergic to amoxicillin and penicillins.  MEDICATIONS:  Current Outpatient Medications  Medication Sig Dispense Refill   albuterol (VENTOLIN HFA) 108 (90 Base) MCG/ACT inhaler Inhale 2 puffs into the lungs every 6 (six) hours as needed for wheezing or shortness of breath. 18 g 1   HYDROcodone-acetaminophen (NORCO/VICODIN) 5-325 MG tablet Take 2 tablets by mouth every 4 (four) hours as needed. 10 tablet 0   levETIRAcetam (KEPPRA) 500 MG tablet Take 1 tablet (500 mg total) by mouth 2 (two) times daily. 60 tablet 6   predniSONE (DELTASONE) 10 MG tablet Take 6 tablets by mouth daily x1 day; then 5 tablet by mouth daily x1 day; then 4 tablets by mouth daily x2 days; then 3 tablets by mouth daily x2 days; then 2 tablets by mouth daily x3 days; then 1 tablet by mouth daily x3 days and stop prednisone. 33 tablet 0   sulfamethoxazole-trimethoprim (BACTRIM DS) 800-160 MG tablet Take 1 tablet by mouth 2 (two) times daily for 7 days. 14 tablet 0   traMADol (ULTRAM) 50 MG tablet Take 1 tablet (50 mg total) by mouth every 12 (twelve) hours as needed. 28 tablet 0   No current facility-administered medications for this visit.    REVIEW OF SYSTEMS:   Review of Systems  Constitutional:  Positive for fatigue and unexpected weight change (-28 lbs). Negative for appetite change and fever.   Respiratory:  Positive for cough, hemoptysis (x1) and shortness of breath.   Cardiovascular:  Positive for leg swelling. Negative for chest pain.  Gastrointestinal:  Positive for blood in stool, constipation, nausea and vomiting.  Endocrine: Negative for hot flashes.  Musculoskeletal:  Positive for back pain (7/10).  Neurological:  Positive for headaches.  Psychiatric/Behavioral:  Positive for sleep disturbance.   All other systems reviewed and are negative.    PHYSICAL EXAMINATION: ECOG PERFORMANCE STATUS: 1 - Symptomatic but completely ambulatory  Vitals:   02/06/22 0854  BP: 104/71  Pulse: 100  Resp: 18  Temp: (!) 97 F (36.1 C)  SpO2: 96%   Filed Weights   02/06/22 0854  Weight: 212 lb 12.8 oz (96.5 kg)   Physical Exam Vitals reviewed.  Constitutional:      Appearance: Normal appearance. She is obese.  Cardiovascular:  Rate and Rhythm: Normal rate and regular rhythm.     Pulses: Normal pulses.     Heart sounds: Normal heart sounds.  Pulmonary:     Effort: Pulmonary effort is normal.     Breath sounds: Normal breath sounds.  Abdominal:     Palpations: Abdomen is soft. There is no hepatomegaly, splenomegaly or mass.     Tenderness: There is no abdominal tenderness.  Lymphadenopathy:     Cervical: No cervical adenopathy.     Right cervical: No superficial cervical adenopathy.    Left cervical: No superficial cervical adenopathy.     Upper Body:     Right upper body: No supraclavicular or axillary adenopathy.     Left upper body: No supraclavicular or axillary adenopathy.     Lower Body: No right inguinal adenopathy. No left inguinal adenopathy.  Neurological:     General: No focal deficit present.     Mental Status: She is alert and oriented to person, place, and time.  Psychiatric:        Mood and Affect: Mood normal.        Behavior: Behavior normal.      LABORATORY DATA:  I have reviewed the data as listed    Latest Ref Rng & Units 02/06/2022    10:49 AM 02/04/2022    3:52 PM 01/16/2022    4:43 PM  CBC  WBC 4.0 - 10.5 K/uL 4.8  4.8  6.4   Hemoglobin 12.0 - 15.0 g/dL 13.4  11.8  14.0   Hematocrit 36.0 - 46.0 % 41.7  37.4  44.2   Platelets 150 - 400 K/uL 277  254  268       Latest Ref Rng & Units 02/06/2022   10:49 AM 02/04/2022    3:52 PM 01/16/2022    4:43 PM  CMP  Glucose 70 - 99 mg/dL 85  99  105   BUN 6 - 20 mg/dL 7  12  10    Creatinine 0.44 - 1.00 mg/dL 0.85  0.92  0.78   Sodium 135 - 145 mmol/L 136  135  138   Potassium 3.5 - 5.1 mmol/L 3.8  3.8  4.0   Chloride 98 - 111 mmol/L 102  105  109   CO2 22 - 32 mmol/L 24  21  23    Calcium 8.9 - 10.3 mg/dL 9.1  8.8  9.6   Total Protein 6.5 - 8.1 g/dL 8.0  7.2    Total Bilirubin 0.3 - 1.2 mg/dL 0.6  0.4    Alkaline Phos 38 - 126 U/L 98  85    AST 15 - 41 U/L 35  24    ALT 0 - 44 U/L 24  19      RADIOGRAPHIC STUDIES: I have personally reviewed the radiological images as listed and agreed with the findings in the report. CT ABDOMEN PELVIS W CONTRAST  Result Date: 02/04/2022 CLINICAL DATA:  Right lower back and flank pain, suspected metastatic malignancy with pulmonary nodules and cavitary lung lesions, abdominal lymphadenopathy * Tracking Code: BO * EXAM: CT ABDOMEN AND PELVIS WITH CONTRAST CT LUMBAR SPINE WITH CONTRAST TECHNIQUE: Multidetector CT imaging of the abdomen and pelvis was performed using the standard protocol following bolus administration of intravenous contrast. Multidetector CT imaging of the lumbar spine was performed using the standard protocol following bolus administration of intravenous contrast. RADIATION DOSE REDUCTION: This exam was performed according to the departmental dose-optimization program which includes automated exposure control, adjustment of the mA  and/or kV according to patient size and/or use of iterative reconstruction technique. CONTRAST:  155mL OMNIPAQUE IOHEXOL 300 MG/ML  SOLN COMPARISON:  CT chest abdomen pelvis, 12/29/2021 FINDINGS: CT  ABDOMEN PELVIS FINDINGS Lower chest: Multiple spiculated appearing masses and nodules throughout the included lower lungs, several of the largest of which are cavitary and not significantly changed compared to prior examination dated 12/29/2021. Hepatobiliary: Multiple subtle, ill-defined liver lesions, for example adjacent to the vena cava, hepatic segment V/VI, measuring approximately 1.8 x 1.2 cm (series 504, image 21) and a lesion of the hepatic dome, segment VII, measuring 4.0 x 4.0 cm (series 504, image 9). No gallstones, gallbladder wall thickening, or biliary dilatation. Pancreas: Unremarkable. No pancreatic ductal dilatation or surrounding inflammatory changes. Spleen: Normal in size without significant abnormality. Adrenals/Urinary Tract: Adrenal glands are unremarkable. Kidneys are normal, without renal calculi, solid lesion, or hydronephrosis. Bladder is unremarkable. Stomach/Bowel: Stomach is within normal limits. Appendix appears normal. No evidence of bowel wall thickening, distention, or inflammatory changes. Vascular/Lymphatic: No significant vascular findings are present. Bulky porta hepatis, portacaval, retroperitoneal, and gastrohepatic ligament lymphadenopathy, unchanged compared to prior examination, largest portacaval node measuring 3.8 x 2.6 cm (series 504, image 32). Additional prominent pelvic sidewall, iliac, and inguinal lymph nodes, largest left pelvic sidewall node measuring 3.2 x 1.2 cm (series 504, image 77). Reproductive: Fluid and or endometrial thickening in the endometrial cavity (series 507, image 65). Other: No abdominal wall hernia or abnormality. No ascites. Musculoskeletal: No acute or significant osseous findings. CT LUMBAR SPINE FINDINGS Alignment: Normal lumbar lordosis. Vertebral bodies: Intact.  No suspicious osseous lesions. Disc spaces: Intact. Paraspinous soft tissues: Unremarkable. IMPRESSION: 1. Multiple spiculated appearing masses and nodules throughout the  included lower lungs, several of the largest of which are cavitary and not significantly changed, highly suspicious for pulmonary metastatic disease. 2. Bulky abdominal and pelvic lymphadenopathy, unchanged compared to prior examination, most consistent with nodal metastatic disease. 3. Multiple subtle, ill-defined liver lesions, incompletely characterized although highly suspicious for metastatic disease. These are less clearly appreciated on prior examination, appearance possibly very sensitive to exact phase of contrast administration. Contrast enhanced MRI could be used to more clearly characterize liver lesions if desired. 4. Fluid and or endometrial thickening in the endometrial cavity. This is at least a potential etiology of primary malignancy. Consider pelvic ultrasound to further evaluate. 5. No fracture or dislocation of the lumbar spine. No suspicious osseous lesions by CT. No findings to explain pain. Electronically Signed   By: Delanna Ahmadi M.D.   On: 02/04/2022 19:09   CT L-SPINE NO CHARGE  Result Date: 02/04/2022 CLINICAL DATA:  Right lower back and flank pain, suspected metastatic malignancy with pulmonary nodules and cavitary lung lesions, abdominal lymphadenopathy * Tracking Code: BO * EXAM: CT ABDOMEN AND PELVIS WITH CONTRAST CT LUMBAR SPINE WITH CONTRAST TECHNIQUE: Multidetector CT imaging of the abdomen and pelvis was performed using the standard protocol following bolus administration of intravenous contrast. Multidetector CT imaging of the lumbar spine was performed using the standard protocol following bolus administration of intravenous contrast. RADIATION DOSE REDUCTION: This exam was performed according to the departmental dose-optimization program which includes automated exposure control, adjustment of the mA and/or kV according to patient size and/or use of iterative reconstruction technique. CONTRAST:  171mL OMNIPAQUE IOHEXOL 300 MG/ML  SOLN COMPARISON:  CT chest abdomen pelvis,  12/29/2021 FINDINGS: CT ABDOMEN PELVIS FINDINGS Lower chest: Multiple spiculated appearing masses and nodules throughout the included lower lungs, several of the largest of which are  cavitary and not significantly changed compared to prior examination dated 12/29/2021. Hepatobiliary: Multiple subtle, ill-defined liver lesions, for example adjacent to the vena cava, hepatic segment V/VI, measuring approximately 1.8 x 1.2 cm (series 504, image 21) and a lesion of the hepatic dome, segment VII, measuring 4.0 x 4.0 cm (series 504, image 9). No gallstones, gallbladder wall thickening, or biliary dilatation. Pancreas: Unremarkable. No pancreatic ductal dilatation or surrounding inflammatory changes. Spleen: Normal in size without significant abnormality. Adrenals/Urinary Tract: Adrenal glands are unremarkable. Kidneys are normal, without renal calculi, solid lesion, or hydronephrosis. Bladder is unremarkable. Stomach/Bowel: Stomach is within normal limits. Appendix appears normal. No evidence of bowel wall thickening, distention, or inflammatory changes. Vascular/Lymphatic: No significant vascular findings are present. Bulky porta hepatis, portacaval, retroperitoneal, and gastrohepatic ligament lymphadenopathy, unchanged compared to prior examination, largest portacaval node measuring 3.8 x 2.6 cm (series 504, image 32). Additional prominent pelvic sidewall, iliac, and inguinal lymph nodes, largest left pelvic sidewall node measuring 3.2 x 1.2 cm (series 504, image 77). Reproductive: Fluid and or endometrial thickening in the endometrial cavity (series 507, image 65). Other: No abdominal wall hernia or abnormality. No ascites. Musculoskeletal: No acute or significant osseous findings. CT LUMBAR SPINE FINDINGS Alignment: Normal lumbar lordosis. Vertebral bodies: Intact.  No suspicious osseous lesions. Disc spaces: Intact. Paraspinous soft tissues: Unremarkable. IMPRESSION: 1. Multiple spiculated appearing masses and  nodules throughout the included lower lungs, several of the largest of which are cavitary and not significantly changed, highly suspicious for pulmonary metastatic disease. 2. Bulky abdominal and pelvic lymphadenopathy, unchanged compared to prior examination, most consistent with nodal metastatic disease. 3. Multiple subtle, ill-defined liver lesions, incompletely characterized although highly suspicious for metastatic disease. These are less clearly appreciated on prior examination, appearance possibly very sensitive to exact phase of contrast administration. Contrast enhanced MRI could be used to more clearly characterize liver lesions if desired. 4. Fluid and or endometrial thickening in the endometrial cavity. This is at least a potential etiology of primary malignancy. Consider pelvic ultrasound to further evaluate. 5. No fracture or dislocation of the lumbar spine. No suspicious osseous lesions by CT. No findings to explain pain. Electronically Signed   By: Jearld Lesch M.D.   On: 02/04/2022 19:09   DG Chest 2 View  Result Date: 02/04/2022 CLINICAL DATA:  Back and chest pain.  Recent diagnosis of pneumonia. EXAM: CHEST - 2 VIEW COMPARISON:  Chest two views 01/16/2022, 12/29/2021; CT chest 12/29/2021; CT abdomen and pelvis 12/29/2021 FINDINGS: Cardiac silhouette and mediastinal contours are within normal limits. There again patchy and nodular opacities within the right mid lung, inferior medial right lung, and left mid and lower lung diffusely. Some of these are slightly more well-defined and this may be secondary to evolution of the numerous cavitary lesion seen on prior CT. No definite pleural effusion is seen. No pneumothorax. No acute skeletal abnormality. IMPRESSION: Redemonstration of bilateral nodular and irregular densities predominantly within the left mid and lower lung and right mid lung. These were seen to be cavitary on prior CT. Differential considerations again include septic emboli, other  multifocal infection,. Note is again made of mediastinal, bilateral hilar, and porta hepatis lymphadenopathy on the 12/29/2021 CT which raises suspicion for metastatic disease. Recommend continued attention on follow-up. Note is made of 12/29/2021 CT abdomen and pelvis Impression: "Follow-up PET-CT and biopsy as warranted should be considered." Electronically Signed   By: Neita Garnet M.D.   On: 02/04/2022 15:49   DG Chest 2 View  Result Date: 01/16/2022  CLINICAL DATA:  Chest pain and cough for 1 day EXAM: CHEST - 2 VIEW COMPARISON:  12/29/2021 chest radiograph and CT chest exams FINDINGS: Normal heart size pulmonary vascularity. Mild enlargement of the pulmonary hila, corresponding to adenopathy on prior CT. Patchy somewhat nodular appearing opacities throughout both lungs similar to previous exam. Cavitary foci seen on prior CT resolved. No new areas of consolidation, pleural effusion, or pneumothorax. Osseous structures unremarkable. IMPRESSION: Patchy nodular appearing opacities throughout both lungs with resolution of cavitary foci identified on prior CT. Findings are concerning for septic emboli, though metastatic disease not completely excluded. Hilar enlargement question reactive adenopathy. Continued follow-up until resolution recommended. Electronically Signed   By: Lavonia Dana M.D.   On: 01/16/2022 16:15    ASSESSMENT:  Lung masses and generalized lymphadenopathy: - 28 pound weight loss in the last 2 months.  No fevers or night sweats.  Nausea with occasional vomiting and inability to keep down meats, hot dogs, fish and shrimp. - Hospitalization from 12/29/2021 - 01/04/2022, presentation with mid chest pain. - CT chest (12/29/2021): Multiple irregular densities noted throughout both lungs, largest 3.3 cm in the right upper lobe and 3.3 x 2.1 cm cavitary abnormality in the left upper lobe.  Mediastinal and hilar adenopathy. - CTAP (12/29/2021): Pathologically enlarged lymph nodes in the upper abdomen,  largest 4 x 2.4 cm between portal vein and IVC.  Other enlarged lymph nodes adjacent to the porta hepatis. - Reports cough since 12/29/2021 with yellow sputum.  She had seen slight blood-streaked sputum this morning for the first time. - Complains of low back pain for the last 2 months and leg pains for 2 weeks. - She has regular monthly menses, light lasting 2 to 3 days.   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 - She is current active smoker, 1 pack/day for the last 17 years.  She also uses cocaine and marijuana. - Father had prostate cancer.  Maternal grandmother had cancer.  Maternal aunt had sarcoidosis.   PLAN:  Lung masses and generalized lymphadenopathy: - I have reviewed images of her scans with the patient in detail. - Differential diagnosis includes malignant causes like lymphoma, germ cell tumors and nonmalignant causes like sarcoidosis. - We will check ACE level, LDH, beta-2 microglobulin, beta-hCG, AFP, CA125, CEA. - Will order PET CT scan and biopsy based on PET avidity. - RTC after biopsy.  2.  Low back pain: - She is asking for pain medication.  We have done urine drug screen today which was positive for opiates, cocaine and THC. - I will give her tramadol 50 mg every 12 hours as needed for severe pain.  I will not give her anything stronger than tramadol while she is also using cocaine and THC.   All questions were answered. The patient knows to call the clinic with any problems, questions or concerns.   Derek Jack, MD, 02/06/22 3:18 PM  [place] [phone]   I, Thana Ates, am acting as a scribe for Dr. Derek Jack.  I, Derek Jack MD, have reviewed the above documentation for accuracy and completeness, and I agree with the above.

## 2022-02-06 NOTE — Patient Instructions (Addendum)
[facility] at [facility] Discharge Instructions  You were seen and examined today by Dr. Ellin Saba. Dr. Ellin Saba is a medical oncologist, meaning that he specializes in the treatment of cancer diagnoses. Dr. Ellin Saba discussed your past medical history, family history of cancers, and the events that led to you being here today.  You were referred to Dr. Ellin Saba by the Emergency Department due to an abnormality on your recent CT scan. Your most recent CT scan showed nodules in your lungs, multiple lymph nodes, liver lesions and thickening of your endometrium. This is all concerning for cancer. It is not clear where it may have arisen from. In order to identify where it may have began, a biopsy will be necessary.  Dr. Ellin Saba has recommended a PET scan. A PET scan is a whole body CT scan that is specialized for cancer. It illuminates where there is cancer present within the body. This will also help to identify the accurate staging of the cancer. It may also be be necessary to identify the safest place to obtain a biopsy from.  Follow-up as scheduled.   Thank you for choosing  Cancer Center at [facility] to provide your oncology and hematology care.  To afford each patient quality time with our provider, please arrive at least 15 minutes before your scheduled appointment time.   If you have a lab appointment with the Cancer Center please come in thru the Main Entrance and check in at the main information desk.  You need to re-schedule your appointment should you arrive 10 or more minutes late.  We strive to give you quality time with our providers, and arriving late affects you and other patients whose appointments are after yours.  Also, if you no show three or more times for appointments you may be dismissed from the clinic at the providers discretion.     Again, thank you for choosing [facility].  Our hope is that these requests  will decrease the amount of time that you wait before being seen by our physicians.       _____________________________________________________________  Should you have questions after your visit to [facility], please contact our office at [phone] and follow the prompts.  Our office hours are 8:00 a.m. and 4:30 p.m. Monday - Friday.  Please note that voicemails left after 4:00 p.m. may not be returned until the following business day.  We are closed weekends and major holidays.  You do have access to a nurse 24-7, just call the main number to the clinic [phone] and do not press any options, hold on the line and a nurse will answer the phone.    For prescription refill requests, have your pharmacy contact our office and allow 72 hours.    Due to Covid, you will need to wear a mask upon entering the hospital. If you do not have a mask, a mask will be given to you at the Main Entrance upon arrival. For doctor visits, patients may have 1 support person age 37 or older with them. For treatment visits, patients can not have anyone with them due to social distancing guidelines and our immunocompromised population.

## 2022-02-07 ENCOUNTER — Telehealth: Payer: Self-pay | Admitting: Emergency Medicine

## 2022-02-07 ENCOUNTER — Encounter (HOSPITAL_COMMUNITY): Payer: Self-pay | Admitting: Emergency Medicine

## 2022-02-07 ENCOUNTER — Other Ambulatory Visit: Payer: Self-pay

## 2022-02-07 ENCOUNTER — Emergency Department (HOSPITAL_COMMUNITY)
Admission: EM | Admit: 2022-02-07 | Discharge: 2022-02-07 | Discharge status: Against Medical Advice | Payer: Medicaid Other | Attending: Emergency Medicine | Admitting: Emergency Medicine

## 2022-02-07 DIAGNOSIS — K920 Hematemesis: Secondary | ICD-10-CM | POA: Insufficient documentation

## 2022-02-07 DIAGNOSIS — M545 Low back pain, unspecified: Secondary | ICD-10-CM | POA: Diagnosis present

## 2022-02-07 DIAGNOSIS — Z5321 Procedure and treatment not carried out due to patient leaving prior to being seen by health care provider: Secondary | ICD-10-CM | POA: Insufficient documentation

## 2022-02-07 HISTORY — DX: Urinary tract infection, site not specified: N39.0

## 2022-02-07 LAB — ANGIOTENSIN CONVERTING ENZYME: Angiotensin-Converting Enzyme: 146 U/L — ABNORMAL HIGH (ref 14–82)

## 2022-02-07 LAB — CEA: CEA: 2.2 ng/mL (ref 0.0–4.7)

## 2022-02-07 LAB — AFP TUMOR MARKER: AFP, Serum, Tumor Marker: <1.8 ng/mL (ref 0.0–6.4)

## 2022-02-07 LAB — BETA HCG QUANT (REF LAB): hCG Quant: <1 m[IU]/mL

## 2022-02-07 LAB — BETA 2 MICROGLOBULIN, SERUM: Beta-2 Microglobulin: 3.5 mg/L — ABNORMAL HIGH (ref 0.6–2.4)

## 2022-02-07 LAB — CA 125: Cancer Antigen (CA) 125: 34.1 U/mL (ref 0.0–38.1)

## 2022-02-07 NOTE — Progress Notes (Signed)
ED Antimicrobial Stewardship Positive Culture Follow Up   Chelsey Sullivan is an 36 y.o. female who presented to [facility] on 02/04/2022 with a chief complaint of  Chief Complaint  Patient presents with   Back Pain    Recent Results (from the past 720 hour(s))  Urine Culture     Status: Abnormal   Collection Time: 02/04/22  3:11 PM   Specimen: Urine, Clean Catch  Result Value Ref Range Status   Specimen Description   Final    URINE, CLEAN CATCH Performed at [facility], [address] [place], [place] [address]    Special Requests   Final    NONE Performed at [facility], [address], [place], [place] [address]    Culture (A)  Final    >=100,000 COLONIES/mL KLEBSIELLA PNEUMONIAE Confirmed Extended Spectrum Beta-Lactamase Producer (ESBL).  In bloodstream infections from ESBL organisms, carbapenems are preferred over piperacillin/tazobactam. They are shown to have a lower risk of mortality.    Report Status 02/06/2022 FINAL  Final   Organism ID, Bacteria KLEBSIELLA PNEUMONIAE (A)  Final      Susceptibility   Klebsiella pneumoniae - MIC*    AMPICILLIN >=32 RESISTANT Resistant     CEFAZOLIN >=64 RESISTANT Resistant     CEFEPIME >=32 RESISTANT Resistant     CEFTRIAXONE >=64 RESISTANT Resistant     CIPROFLOXACIN 2 RESISTANT Resistant     GENTAMICIN <=1 SENSITIVE Sensitive     IMIPENEM <=0.25 SENSITIVE Sensitive     NITROFURANTOIN 64 INTERMEDIATE Intermediate     TRIMETH/SULFA >=320 RESISTANT Resistant     AMPICILLIN/SULBACTAM >=32 RESISTANT Resistant     PIP/TAZO 16 SENSITIVE Sensitive     * >=100,000 COLONIES/mL KLEBSIELLA PNEUMONIAE    [x]  Treated with Bactrim, organism resistant to prescribed antimicrobial  Patient needs to be advised to return to ED for re-evaluation of potential need for IV antibiotics for multi-drug resistant UTI.  ED Provider: MD   Derwood Kaplan, PharmD, BCPS 02/07/2022 11:01 AM ED Clinical Pharmacist -  [phone]

## 2022-02-07 NOTE — ED Triage Notes (Signed)
Patient recently seen on the 15 and treated for UTI and possible endometrial malignancy. Patient given po antibiotics. Patient called today by hospital and told that she needed to come to ED for IV antibiotics. Patient also reports increase pain in lower back with new onset nausea and vomiting. Per patient light tint of blood in emesis.

## 2022-02-07 NOTE — Telephone Encounter (Signed)
Post ED Visit - Positive Culture Follow-up: Successful Patient Follow-Up  Culture assessed and recommendations reviewed by:  []  , Pharm.D. []  Enzo Bi, Pharm.D., BCPS AQ-ID []  , Pharm.D., BCPS []  Celedonio Miyamoto, Pharm.D., BCPS []  [place], Garvin Fila.D., BCPS, AAHIVP []  , Pharm.D., BCPS, AAHIVP []  Georgina Pillion, PharmD, BCPS []  , PharmD, BCPS []  [place], PharmD, BCPS []  [place], PharmD PharmD  Positive urine culture  []  Patient discharged without antimicrobial prescription and treatment is now indicated [x]  Organism is resistant to prescribed ED discharge antimicrobial []  Patient with positive blood cultures  Changes discussed with ED provider: Dr Estella Husk New antibiotic prescription instruct patient that they need to return to the ED for reassessment for potential IV antibiotics for multidrug resistant UTI  Attempting to contact patient   02/07/2022, 12:23 PM

## 2022-02-10 NOTE — Progress Notes (Unsigned)
Suttle, Chelsey Sheller, MD  Leodis Rains D Approved for ultrasound guided inguinal lymph node biopsy.   Chelsey Sullivan

## 2022-02-10 NOTE — Progress Notes (Unsigned)
Mir, Al Corpus, MD  Leodis Rains D You can schedule it as Dr. Elby Showers approved it.  Just don't put it on my day.  Thanks   Mir

## 2022-02-12 ENCOUNTER — Encounter (HOSPITAL_COMMUNITY)
Admission: RE | Admit: 2022-02-12 | Discharge: 2022-02-12 | Disposition: A | Discharge status: Home / Self Care | Payer: Medicaid Other | Source: Ambulatory Visit | Attending: Hematology | Admitting: Hematology

## 2022-02-12 DIAGNOSIS — R591 Generalized enlarged lymph nodes: Secondary | ICD-10-CM | POA: Diagnosis present

## 2022-02-12 MED ORDER — FLUDEOXYGLUCOSE F - 18 (FDG) INJECTION
11.0800 | Freq: Once | INTRAVENOUS | Status: AC | PRN
  Administered 2022-02-12: 11.08 via INTRAVENOUS

## 2022-02-16 ENCOUNTER — Ambulatory Visit (INDEPENDENT_AMBULATORY_CARE_PROVIDER_SITE_OTHER): Payer: Medicaid Other | Admitting: Obstetrics & Gynecology

## 2022-02-16 ENCOUNTER — Encounter: Payer: Self-pay | Admitting: Obstetrics & Gynecology

## 2022-02-16 ENCOUNTER — Other Ambulatory Visit (HOSPITAL_COMMUNITY)
Admission: RE | Admit: 2022-02-16 | Discharge: 2022-02-16 | Disposition: A | Discharge status: Home / Self Care | Payer: Medicaid Other | Source: Ambulatory Visit | Attending: Obstetrics & Gynecology | Admitting: Obstetrics & Gynecology

## 2022-02-16 VITALS — BP 115/84 | HR 85

## 2022-02-16 DIAGNOSIS — R9389 Abnormal findings on diagnostic imaging of other specified body structures: Secondary | ICD-10-CM | POA: Diagnosis present

## 2022-02-16 DIAGNOSIS — R591 Generalized enlarged lymph nodes: Secondary | ICD-10-CM | POA: Diagnosis not present

## 2022-02-18 ENCOUNTER — Other Ambulatory Visit: Payer: Self-pay | Admitting: Radiology

## 2022-02-18 DIAGNOSIS — R591 Generalized enlarged lymph nodes: Secondary | ICD-10-CM

## 2022-02-18 LAB — SURGICAL PATHOLOGY

## 2022-02-18 NOTE — Progress Notes (Deleted)
Chelsey Sullivan, female    DOB: May 15, 1986   MRN: [id number]   Brief patient profile:  ***  *** referred to pulmonary clinic 02/19/2022 by *** for ***       Admit date:     12/29/2021  Discharge date: 01/04/22  Discharge Physician: Barton Dubois    PCP: Patient, No Pcp Per (Inactive)    Recommendations at discharge:  Repeat basic metabolic panel to follow electrolytes and renal function Make sure patient follow-up with pulmonologist to further evaluate lung nodules and complete work-up with PET scan and he required bronchoscopy. Make sure patient has follow-up with gastroenterology service as instructed. -Continue assisting patient with tobacco cessation and stopping recreational drugs. Assist patient with weight management.   Discharge Diagnoses: Principal Problem:   Pulmonary nodules/lesions, multiple Active Problems:   Shortness of breath   Gastroesophageal reflux disease   Esophageal dysphagia   Cavitary lesion of lung   Class 1 obesity   Febrile illness   Hospital Course: Timmie Calix is a 36 y.o. female with a history of seizure disorder, depression, iron deficiency anemia, cocaine use remotely who presented to the ED 5/8 with mid-chest pain after a trip and fall. She reported recent fatigue, poor sleep. CT scan of the chest showed multiple irregular densities noted throughout both lungs many of which are cavitary and the largest measures approximately 3.3 cm.  She has extensive mediastinal adenopathy as well as enlarged adenopathy in the porta hepatis region of the abdomen.  Her densities may represent septic emboli or multifocal infection they are concerning for metastatic disease given the extensive adenopathy as described CT scan of the abdomen and pelvis is recommended for further evaluation per radiology.   IV antibiotics started, quantiferon gold and blood cultures sent and the patient was admitted for further work up with pulmonary consultation.   Assessment and  Plan: Bilateral pulmonary densities/fever: cavitary lesions, extensive mediastinal adenopathy. Pathologically enlarged upper abdominal lymph nodes up to 4cm. Nonspecific cervical lymphadenopathy. Splenomegaly, hepatomegaly. No convincing evidence of Lemierre's disease on CTA of the neck. There is paranasal sinus fluid, though pt doesn't have signs or symptoms of acute sinusitis at this time. Pt has some nonspecific constitutional symptoms, though fever curve (Tmax 99.50F) and WBC (normal at 6.4k) at time of admission are reassuring.  -Currently afebrile. - DDx to consider includes septic emboli and metastatic malignancy of unclear primary. Also need to consider fungal infection, auto-immune, and arboviruses.  - Echocardiogram is largely unremarkable, including no vegetations noted on TTE. - Pt has history of incarceration, PPD and QuantiFERON gold for tuberculosis negative; airborne precaution has been discontinued. - Appreciate pulmonary consultation. They have recommended outpatient follow up with plans for PET-CT after discharge and trial of empiric steroids; given concern for vasculitis/collagen disorder.  W -Patient with positive rheumatoid factor, elevated CRP and ESR.   -Following recommendations by pulmonologist head MRI has been ordered; no acute intracranial abnormalities appreciated. -Physical therapy has seen patient and recommended home health PT and rolling walker at discharge.  Given history of cocaine use home health agencies has denied to provide home treatments; DME will be provided and outpatient PT will be arranged as backup plan. -QuantiFERON gold Ehrlichia titers and [facility] fever results are negative. -Also negative cryptococcal antigen, Blastomyces antigen and Lyme disease serology. -ANCA profile negative - Note pt was evaluated in ED 11/27/2021 complaining of tick bite. This broadens differential somewhat, though there are not the typical features of tick-borne illnesses,  specifically no rash appreciated. -Blood  cultures demonstrating 1 out of 4 staph argues capitis suggesting contaminant. -Complete treatment with oral antibiotics as prescribed.  -Patient has remained afebrile and feeling ready to go home.   History of cocaine use: No evidence of IVDU at this time on exam.  -HIV is nonreactive. -UDS positive for cocaine and opiates. -Cessation counseling provided. -Outpatient resources provided.   Chest pain: Due to trip/fall. No significant traumatic complications on CT's.  -Cardiac enzymes negative, EKG, 2D echo and telemetry reassuring and ruling out cardiac etiology. -Continue supportive care and as needed analgesics.   Obesity: Estimated body mass index is 35.16 kg/m as calculated from the following:   Height as of this encounter: _0  (1.727 m).   Weight as of this encounter: 104.9 kg. -Low calorie diet, portion control and increase physical activity discussed with patient.   GERD/difficulty swallowing solids -Continue PPI -Appreciate GI service assistance and recommendation. -Status post endoscopic evaluation demonstrating Schatzki ring with mild stricture, gastritis and esophagitis.  Biopsies were taken, dilatation provided and instructions to use PPI twice a day for 12 weeks and Carafate for 1 week provided. -Avoid the use of NSAIDs and tobacco abuse.      History of Present Illness  02/19/2022  Pulmonary/ 1st office eval/Ashtin Rosner  No chief complaint on file.    Dyspnea:  *** Cough: *** Sleep: *** SABA use:   Past Medical History:  Diagnosis Date   Cocaine abuse ([place])    Depression    Epilepsy ([place])    UTI (urinary tract infection)     Outpatient Medications Prior to Visit  Medication Sig Dispense Refill   albuterol (VENTOLIN HFA) 108 (90 Base) MCG/ACT inhaler Inhale 2 puffs into the lungs every 6 (six) hours as needed for wheezing or shortness of breath. (Patient not taking: Reported on 02/16/2022) 18 g 1    HYDROcodone-acetaminophen (NORCO/VICODIN) 5-325 MG tablet Take 2 tablets by mouth every 4 (four) hours as needed. 10 tablet 0   levETIRAcetam (KEPPRA) 500 MG tablet Take 1 tablet (500 mg total) by mouth 2 (two) times daily. (Patient not taking: Reported on 02/16/2022) 60 tablet 6   predniSONE (DELTASONE) 10 MG tablet Take 6 tablets by mouth daily x1 day; then 5 tablet by mouth daily x1 day; then 4 tablets by mouth daily x2 days; then 3 tablets by mouth daily x2 days; then 2 tablets by mouth daily x3 days; then 1 tablet by mouth daily x3 days and stop prednisone. (Patient not taking: Reported on 02/16/2022) 33 tablet 0   traMADol (ULTRAM) 50 MG tablet Take 1 tablet (50 mg total) by mouth every 12 (twelve) hours as needed. (Patient not taking: Reported on 02/16/2022) 28 tablet 0   No facility-administered medications prior to visit.     Objective:     LMP  (LMP Unknown) Comment: Negative preg in ED 02/04/22. BTF         Assessment   No problem-specific Assessment & Plan notes found for this encounter.     Christinia Gully, MD 02/18/2022

## 2022-02-19 ENCOUNTER — Ambulatory Visit (HOSPITAL_COMMUNITY): Payer: Medicaid Other | Admitting: Hematology

## 2022-02-19 ENCOUNTER — Encounter (HOSPITAL_COMMUNITY): Payer: Self-pay

## 2022-02-19 ENCOUNTER — Ambulatory Visit (HOSPITAL_COMMUNITY): Admission: RE | Admit: 2022-02-19 | Payer: Medicaid Other | Source: Ambulatory Visit

## 2022-02-19 ENCOUNTER — Inpatient Hospital Stay: Payer: Medicaid Other | Admitting: Internal Medicine

## 2022-02-19 NOTE — Progress Notes (Signed)
Call placed to patient regarding missed biopsy appt. Patient states that she is in pain. Encouraged patient to continue tramadol, discussed the inability to prescribe anything further due to urine tox screen results. Patient verbalized understanding. Encouraged patient to reschedule and keep biopsy appt, as treating the cause of the pain will be the most effective way to control pain. Patient verbalized understanding. Message sent to Leodis Rains, central scheduling, to reschedule patient's biopsy.

## 2022-02-25 ENCOUNTER — Ambulatory Visit (HOSPITAL_COMMUNITY): Admission: RE | Admit: 2022-02-25 | Payer: Medicaid Other | Source: Ambulatory Visit

## 2022-02-26 ENCOUNTER — Ambulatory Visit (HOSPITAL_COMMUNITY): Payer: Medicaid Other | Attending: Physical Therapy | Admitting: Physical Therapy

## 2022-02-26 ENCOUNTER — Inpatient Hospital Stay (HOSPITAL_COMMUNITY): Payer: Medicaid Other | Attending: Hematology | Admitting: Hematology

## 2022-03-01 ENCOUNTER — Emergency Department (HOSPITAL_COMMUNITY)
Admission: EM | Admit: 2022-03-01 | Discharge: 2022-03-01 | Disposition: A | Discharge status: Home / Self Care | Payer: Medicaid Other | Attending: Emergency Medicine | Admitting: Emergency Medicine

## 2022-03-01 ENCOUNTER — Emergency Department (HOSPITAL_COMMUNITY): Payer: Medicaid Other

## 2022-03-01 ENCOUNTER — Other Ambulatory Visit: Payer: Self-pay

## 2022-03-01 ENCOUNTER — Encounter (HOSPITAL_COMMUNITY): Payer: Self-pay

## 2022-03-01 DIAGNOSIS — R1011 Right upper quadrant pain: Secondary | ICD-10-CM | POA: Insufficient documentation

## 2022-03-01 DIAGNOSIS — M549 Dorsalgia, unspecified: Secondary | ICD-10-CM | POA: Insufficient documentation

## 2022-03-01 DIAGNOSIS — G893 Neoplasm related pain (acute) (chronic): Secondary | ICD-10-CM | POA: Insufficient documentation

## 2022-03-01 DIAGNOSIS — R109 Unspecified abdominal pain: Secondary | ICD-10-CM | POA: Diagnosis present

## 2022-03-01 LAB — COMPREHENSIVE METABOLIC PANEL
ALT: 20 U/L (ref 0–44)
AST: 32 U/L (ref 15–41)
Albumin: 3.2 g/dL — ABNORMAL LOW (ref 3.5–5.0)
Alkaline Phosphatase: 97 U/L (ref 38–126)
Anion gap: 7 (ref 5–15)
BUN: 8 mg/dL (ref 6–20)
CO2: 25 mmol/L (ref 22–32)
Calcium: 9.1 mg/dL (ref 8.9–10.3)
Chloride: 107 mmol/L (ref 98–111)
Creatinine, Ser: 0.84 mg/dL (ref 0.44–1.00)
GFR, Estimated: >60 mL/min (ref >60)
Glucose, Bld: 92 mg/dL (ref 70–99)
Potassium: 3.6 mmol/L (ref 3.5–5.1)
Sodium: 139 mmol/L (ref 135–145)
Total Bilirubin: 0.5 mg/dL (ref 0.3–1.2)
Total Protein: 7.8 g/dL (ref 6.5–8.1)

## 2022-03-01 LAB — CBC WITH DIFFERENTIAL/PLATELET
Abs Immature Granulocytes: 0.02 10*3/uL (ref 0.00–0.07)
Basophils Absolute: 0 10*3/uL (ref 0.0–0.1)
Basophils Relative: 1 %
Eosinophils Absolute: 0.1 10*3/uL (ref 0.0–0.5)
Eosinophils Relative: 2 %
HCT: 40.8 % (ref 36.0–46.0)
Hemoglobin: 13.1 g/dL (ref 12.0–15.0)
Immature Granulocytes: 0 %
Lymphocytes Relative: 11 %
Lymphs Abs: 0.6 10*3/uL — ABNORMAL LOW (ref 0.7–4.0)
MCH: 28.8 pg (ref 26.0–34.0)
MCHC: 32.1 g/dL (ref 30.0–36.0)
MCV: 89.7 fL (ref 80.0–100.0)
Monocytes Absolute: 0.3 10*3/uL (ref 0.1–1.0)
Monocytes Relative: 6 %
Neutro Abs: 4 10*3/uL (ref 1.7–7.7)
Neutrophils Relative %: 80 %
Platelets: 281 10*3/uL (ref 150–400)
RBC: 4.55 MIL/uL (ref 3.87–5.11)
RDW: 13.9 % (ref 11.5–15.5)
WBC: 5 10*3/uL (ref 4.0–10.5)
nRBC: 0 % (ref 0.0–0.2)

## 2022-03-01 MED ORDER — MORPHINE SULFATE 15 MG PO TABS
15.0000 mg | ORAL_TABLET | ORAL | Status: DC | PRN
  Administered 2022-03-01: 15 mg via ORAL
  Filled 2022-03-01: qty 1

## 2022-03-01 MED ORDER — IOHEXOL 300 MG/ML  SOLN
100.0000 mL | Freq: Once | INTRAMUSCULAR | Status: AC | PRN
Start: 2022-03-01 — End: 2022-03-01
  Administered 2022-03-01: 100 mL via INTRAVENOUS

## 2022-03-01 MED ORDER — NAPROXEN 500 MG PO TABS: 500.0000 mg | ORAL_TABLET | Freq: Two times a day (BID) | ORAL | 0 refills | Status: AC

## 2022-03-01 MED ORDER — MORPHINE SULFATE 15 MG PO TABS
15.0000 mg | ORAL_TABLET | Freq: Once | ORAL | Status: AC | PRN
  Administered 2022-03-01: 15 mg via ORAL
  Filled 2022-03-01: qty 1

## 2022-03-01 MED ORDER — MORPHINE SULFATE 30 MG PO TABS: 30.0000 mg | ORAL_TABLET | Freq: Three times a day (TID) | ORAL | 0 refills | Status: AC | PRN

## 2022-03-01 MED ORDER — FENTANYL CITRATE (PF) 100 MCG/2ML IJ SOLN
100.0000 ug | Freq: Once | INTRAMUSCULAR | Status: AC
  Administered 2022-03-01: 100 ug via INTRAVENOUS
  Filled 2022-03-01: qty 2

## 2022-03-01 NOTE — Discharge Instructions (Signed)
The workup in the ER including your CT scan is reassuring. Likely the pain is due to  cancer.  We recommend that you discuss pain management with your cancer team. I have prescribed you pain medication that can be used only if the pain is excruciating and not responding to Tylenol.

## 2022-03-01 NOTE — ED Triage Notes (Signed)
Pt presents to ED with complaints of severe pain in right side and back. Pt states she also started vomiting last night. Pt with endometrial malignancy.

## 2022-03-01 NOTE — ED Provider Notes (Signed)
[facility] EMERGENCY DEPARTMENT Provider Note   CSN: [id number] Arrival date & time: 03/01/22  1643     History  Chief Complaint  Patient presents with   Back Pain    Chelsey Sullivan is a 36 y.o. female.  HPI     36 year old female comes in with chief complaint of back pain.  Patient indicates that she was recently diagnosed with mass over her lungs and her liver.  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  She has had chronic pain over the right flank region.  However, last night the pain was more severe.  She is not taking any narcotic medicine at this time.  She took over-the-counter medicine without significant relief.  Pain is nonradiating.  She denies any pleuritic component to that pain.  Interestingly, patient was seen sometime in June and diagnosed with pyelonephritis.  She has not taken antibiotics after she received a call from the hospital for her to come to the emergency room for admission for IV antibiotics.  Patient has no burning with urination, increased urinary frequency, blood in the urine.  She also denies any fevers, chills.  Did have some nausea yesterday.  Home Medications Prior to Admission medications   Medication Sig Start Date End Date Taking? Authorizing Provider  morphine (MSIR) 30 MG tablet Take 1 tablet (30 mg total) by mouth every 8 (eight) hours as needed for severe pain. 03/01/22  Yes Corie Vavra, MD  naproxen (NAPROSYN) 500 MG tablet Take 1 tablet (500 mg total) by mouth 2 (two) times daily. 03/01/22  Yes Derwood Kaplan, MD  albuterol (VENTOLIN HFA) 108 (90 Base) MCG/ACT inhaler Inhale 2 puffs into the lungs every 6 (six) hours as needed for wheezing or shortness of breath. Patient not taking: Reported on 02/16/2022 06/26/19   Shon Hale, MD  HYDROcodone-acetaminophen (NORCO/VICODIN) 5-325 MG tablet Take 2 tablets by mouth every 4 (four) hours as needed. 02/05/22   Kommor, [place], MD  levETIRAcetam (KEPPRA) 500 MG tablet Take 1 tablet (500  mg total) by mouth 2 (two) times daily. Patient not taking: Reported on 02/16/2022 06/26/19   Shon Hale, MD  predniSONE (DELTASONE) 10 MG tablet Take 6 tablets by mouth daily x1 day; then 5 tablet by mouth daily x1 day; then 4 tablets by mouth daily x2 days; then 3 tablets by mouth daily x2 days; then 2 tablets by mouth daily x3 days; then 1 tablet by mouth daily x3 days and stop prednisone. Patient not taking: Reported on 02/16/2022 01/04/22   Vassie Loll, MD  traMADol (ULTRAM) 50 MG tablet Take 1 tablet (50 mg total) by mouth every 12 (twelve) hours as needed. Patient not taking: Reported on 02/16/2022 02/06/22   Doreatha Massed, MD  ferrous sulfate 325 (65 FE) MG tablet Take 1 tablet (325 mg total) by mouth 2 (two) times daily with a meal. Patient not taking: Reported on 08/04/2019 06/26/19 10/15/19  Shon Hale, MD      Allergies    Amoxicillin and Penicillins    Review of Systems   Review of Systems  All other systems reviewed and are negative.   Physical Exam Updated Vital Signs BP (!) 136/95 (BP Location: Right Arm)   Pulse 61   Temp 98.9 F (37.2 C) (Oral)   Resp 18   Ht 5\' 9"  (1.753 m)   Wt 96.1 kg   LMP  (LMP Unknown) Comment: Negative preg in ED 02/04/22. BTF  SpO2 95%   BMI 31.28 kg/m  Physical Exam Vitals and  nursing note reviewed.  Constitutional:      Appearance: She is well-developed.  HENT:     Head: Atraumatic.  Cardiovascular:     Rate and Rhythm: Normal rate.  Pulmonary:     Effort: Pulmonary effort is normal.  Abdominal:     Tenderness: There is abdominal tenderness.     Comments: Patient has tenderness over the right flank region and right upper quadrant, right flank region has guarding.  Musculoskeletal:     Cervical back: Normal range of motion and neck supple.  Skin:    General: Skin is warm and dry.  Neurological:     Mental Status: She is alert and oriented to person, place, and time.     ED Results / Procedures / Treatments    Labs (all labs ordered are listed, but only abnormal results are displayed) Labs Reviewed  COMPREHENSIVE METABOLIC PANEL - Abnormal; Notable for the following components:      Result Value   Albumin 3.2 (*)    All other components within normal limits  CBC WITH DIFFERENTIAL/PLATELET - Abnormal; Notable for the following components:   Lymphs Abs 0.6 (*)    All other components within normal limits    EKG None  Radiology CT ABDOMEN PELVIS W CONTRAST  Result Date: 03/01/2022 CLINICAL DATA:  Metastatic disease evaluation also has flank pain. EXAM: CT ABDOMEN AND PELVIS WITH CONTRAST TECHNIQUE: Multidetector CT imaging of the abdomen and pelvis was performed using the standard protocol following bolus administration of intravenous contrast. RADIATION DOSE REDUCTION: This exam was performed according to the departmental dose-optimization program which includes automated exposure control, adjustment of the mA and/or kV according to patient size and/or use of iterative reconstruction technique. CONTRAST:  OMNIPAQUE IOHEXOL 300 MG/ML  SOLN COMPARISON:  PET CT 02/12/2022.  CT abdomen and pelvis 02/04/2022. FINDINGS: Lower chest: Multiple spiculated nodules and masses, some cavitary again noted in the lower lungs, unchanged since recent study. No effusions. Hepatobiliary: Heterogeneous, ill-defined low-density areas throughout the liver again noted. Gallbladder contracted. Pancreas: No focal abnormality or ductal dilatation. Spleen: No focal abnormality.  Normal size. Adrenals/Urinary Tract: No adrenal abnormality. No focal renal abnormality. No stones or hydronephrosis. Urinary bladder is unremarkable. Stomach/Bowel: Normal appendix. Stomach, large and small bowel grossly unremarkable. Vascular/Lymphatic: Aorta normal caliber. Bulky upper abdominal and retroperitoneal adenopathy is unchanged since recent study. Reproductive: Uterus and adnexa unremarkable.  No mass. Other: No free fluid or free air.  Musculoskeletal: No acute bony abnormality. IMPRESSION: Numerous spiculated nodule masses in the lower lungs unchanged. Heterogeneous, ill-defined low-density areas in the liver compatible metastases, unchanged. Bulky upper abdominal and retroperitoneal adenopathy, unchanged. No renal or ureteral stones.  No hydronephrosis. Electronically Signed   By: Charlett Nose M.D.   On: 03/01/2022 20:00    Procedures Procedures    Medications Ordered in ED Medications  morphine (MSIR) tablet 15 mg (15 mg Oral Given 03/01/22 2003)  morphine (MSIR) tablet 15 mg (has no administration in time range)  fentaNYL (SUBLIMAZE) injection 100 mcg (100 mcg Intravenous Given 03/01/22 1758)  iohexol (OMNIPAQUE) 300 MG/ML solution 100 mL (100 mLs Intravenous Contrast Given 03/01/22 1937)    ED Course/ Medical Decision Making/ A&P                           Medical Decision Making Amount and/or Complexity of Data Reviewed Labs: ordered. Radiology: ordered.  Risk Prescription drug management.   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 The complaint involves an extensive differential diagnosis and also carries with it a high risk of complications and morbidity.    The differential diagnosis includes: worsening cancer, portal thrombosis, pleural effusion, pyelonephritis   The initial plan is to for patient to get basic labs. She had the event of ? Uti that was never fully treated.  It is possible that patient had asymptomatic bacteriuria, and there is my suspicion, however the other possibility includes pyelonephritis or perinephric abscess.  We will get a CT abdomen and pelvis to evaluate the morphology of the cancer burden and also rule out pyelonephritis/perinephric abscess.  I do not think a CT angio chest PE  is needed at this time.   Additional history obtained: Additional history obtained from family Records reviewed  oncology visit, gynecology visit, CT scan and PET scans that were done within the last 2 months, showing metastatic cancer.  Gynecologic evaluation appears to be reassuring.  Independent labs interpretation:  The following labs were independently interpreted: Normal CBC and metabolic profile  Independent visualization of imaging: - I independently visualized the following imaging with scope of interpretation limited to determining acute life threatening conditions related to emergency care: CT scan of the abdomen pelvis, which revealed diffuse cancerous lesions.  No evidence of any internal bleeding or perforation  Treatment and Reassessment: Patient wants to go home now.  She has received 1 round of IV medication and 1 oral morphine. I have reviewed [place] controlled substance database.  Patient has a low narcotic score.  Given that this is cancer pain, we will give her some oral morphine and advised follow-up with her oncologist for optimal pain control.  Final Clinical Impression(s) / ED Diagnoses Final diagnoses:  Cancer associated pain    Rx / DC Orders ED Discharge Orders          Ordered    morphine (MSIR) 30 MG tablet  Every 8 hours PRN        03/01/22 2150    naproxen (NAPROSYN) 500 MG tablet  2 times daily        03/01/22 2150              Derwood Kaplan, MD 03/01/22 2156

## 2022-03-04 ENCOUNTER — Other Ambulatory Visit (HOSPITAL_COMMUNITY): Payer: Self-pay | Admitting: Physician Assistant

## 2022-03-05 ENCOUNTER — Encounter (HOSPITAL_COMMUNITY): Payer: Self-pay

## 2022-03-05 ENCOUNTER — Ambulatory Visit (HOSPITAL_COMMUNITY): Admission: RE | Admit: 2022-03-05 | Payer: Medicaid Other | Source: Ambulatory Visit

## 2022-03-05 NOTE — Progress Notes (Signed)
Attempted to reach the patient numerous times. Patient to not arrive for biopsy appt scheduled day. Unable to reach patient despite multiple attempts. When attempting to call patient, a recording says that the call cannot be completed as dialed.

## 2022-03-05 NOTE — H&P (Incomplete)
Chief Complaint: Patient was seen in consultation today for generalized lymphadenopathy at the request of Katragadda,Sreedhar  Referring Physician(s): Katragadda,Sreedhar  Supervising Physician: Irish LackYamagata, Glenn  [facility]  History of Present Illness: Chelsey Sullivan is a 36 y.o. female with PMH of depression, epilepsy, tobacco abuse and drug use. Pt was recently diagnosed with lung masses after presenting to ED 12/29/21 c/o chest pain. CT chest showed multiple irregular densities noted throughout both lungs. Pt was referred to IR for lymph node biopsy by Dr. Ellin SabaKatragadda for generalized lymphadenopathy. Dr. Elby ShowersSuttle approved inguinal lymph node biopsy.   Past Medical History:  Diagnosis Date   Cocaine abuse (HCC)    Depression    Epilepsy (HCC)    UTI (urinary tract infection)     Past Surgical History:  Procedure Laterality Date   ESOPHAGOGASTRODUODENOSCOPY (EGD) WITH PROPOFOL N/A 01/04/2022   Procedure: ESOPHAGOGASTRODUODENOSCOPY (EGD) WITH PROPOFOL;  Surgeon: Lanelle Balarver, Charles K, DO;  Location: [facility];  Service: Endoscopy;  Laterality: N/A;   HERNIA REPAIR     umbilical hernia repair x 2.    Allergies: Amoxicillin and Penicillins  Medications: Prior to Admission medications   Medication Sig Start Date End Date Taking? Authorizing Provider  albuterol (VENTOLIN HFA) 108 (90 Base) MCG/ACT inhaler Inhale 2 puffs into the lungs every 6 (six) hours as needed for wheezing or shortness of breath. Patient not taking: Reported on 02/16/2022 06/26/19   Shon HaleEmokpae, Courage, MD  HYDROcodone-acetaminophen (NORCO/VICODIN) 5-325 MG tablet Take 2 tablets by mouth every 4 (four) hours as needed. 02/05/22   Kommor, Madison, MD  levETIRAcetam (KEPPRA) 500 MG tablet Take 1 tablet (500 mg total) by mouth 2 (two) times daily. Patient not taking: Reported on 02/16/2022 06/26/19   Shon HaleEmokpae, Courage, MD  morphine (MSIR) 30 MG tablet Take 1 tablet (30 mg total) by mouth every 8 (eight)  hours as needed for severe pain. 03/01/22   Derwood KaplanNanavati, Ankit, MD  naproxen (NAPROSYN) 500 MG tablet Take 1 tablet (500 mg total) by mouth 2 (two) times daily. 03/01/22   Derwood KaplanNanavati, Ankit, MD  predniSONE (DELTASONE) 10 MG tablet Take 6 tablets by mouth daily x1 day; then 5 tablet by mouth daily x1 day; then 4 tablets by mouth daily x2 days; then 3 tablets by mouth daily x2 days; then 2 tablets by mouth daily x3 days; then 1 tablet by mouth daily x3 days and stop prednisone. Patient not taking: Reported on 02/16/2022 01/04/22   Vassie LollMadera, Carlos, MD  traMADol (ULTRAM) 50 MG tablet Take 1 tablet (50 mg total) by mouth every 12 (twelve) hours as needed. Patient not taking: Reported on 02/16/2022 02/06/22   Doreatha MassedKatragadda, Sreedhar, MD  ferrous sulfate 325 (65 FE) MG tablet Take 1 tablet (325 mg total) by mouth 2 (two) times daily with a meal. Patient not taking: Reported on 08/04/2019 06/26/19 10/15/19  Shon HaleEmokpae, Courage, MD     Family History  Problem Relation Age of Onset   Hypertension Other    Diabetes Other    Epilepsy Other    Seizures Sister    Hypertension Maternal Grandmother    Kidney disease Maternal Grandmother     Social History   Socioeconomic History   Marital status: Legally Separated    Spouse name: Jonathon   Number of children: 6   Years of education: Not on file   Highest education level: Not on file  Occupational History   Occupation: unemployed  Tobacco Use   Smoking status: Every Day    Packs/day:  1.00    Types: Cigarettes   Smokeless tobacco: Never  Vaping Use   Vaping Use: Never used  Substance and Sexual Activity   Alcohol use: Yes    Comment: occ   Drug use: Not Currently    Types: Cocaine   Sexual activity: Yes    Birth control/protection: None  Other Topics Concern   Not on file  Social History Narrative   Not on file   Social Determinants of Health   Financial Resource Strain: Low Risk  (06/24/2019)   Overall Financial Resource Strain (CARDIA)    Difficulty  of Paying Living Expenses: Not very hard  Food Insecurity: Unknown (06/24/2019)   Hunger Vital Sign    Worried About Running Out of Food in the Last Year: Patient refused    Ran Out of Food in the Last Year: Patient refused  Transportation Needs: Unknown (06/24/2019)   PRAPARE - Administrator, Civil Service (Medical): Patient refused    Lack of Transportation (Non-Medical): Patient refused  Physical Activity: Unknown (06/24/2019)   Exercise Vital Sign    Days of Exercise per Week: Patient refused    Minutes of Exercise per Session: Patient refused  Stress: Not on file  Social Connections: Unknown (06/24/2019)   Social Connection and Isolation Panel [NHANES]    Frequency of Communication with Friends and Family: Patient refused    Frequency of Social Gatherings with Friends and Family: Patient refused    Attends Religious Services: Patient refused    Database administrator or Organizations: Patient refused    Attends Banker Meetings: Patient refused    Marital Status: Patient refused    Review of Systems: A 12 point ROS discussed and pertinent positives are indicated in the HPI above.  All other systems are negative.  Review of Systems  Vital Signs: LMP  (LMP Unknown) Comment: Negative preg in ED 02/04/22. BTF    Physical Exam  Imaging: CT ABDOMEN PELVIS W CONTRAST  Result Date: 03/01/2022 CLINICAL DATA:  Metastatic disease evaluation also has flank pain. EXAM: CT ABDOMEN AND PELVIS WITH CONTRAST TECHNIQUE: Multidetector CT imaging of the abdomen and pelvis was performed using the standard protocol following bolus administration of intravenous contrast. RADIATION DOSE REDUCTION: This exam was performed according to the departmental dose-optimization program which includes automated exposure control, adjustment of the mA and/or kV according to patient size and/or use of iterative reconstruction technique. CONTRAST:  OMNIPAQUE IOHEXOL 300 MG/ML  SOLN  COMPARISON:  PET CT 02/12/2022.  CT abdomen and pelvis 02/04/2022. FINDINGS: Lower chest: Multiple spiculated nodules and masses, some cavitary again noted in the lower lungs, unchanged since recent study. No effusions. Hepatobiliary: Heterogeneous, ill-defined low-density areas throughout the liver again noted. Gallbladder contracted. Pancreas: No focal abnormality or ductal dilatation. Spleen: No focal abnormality.  Normal size. Adrenals/Urinary Tract: No adrenal abnormality. No focal renal abnormality. No stones or hydronephrosis. Urinary bladder is unremarkable. Stomach/Bowel: Normal appendix. Stomach, large and small bowel grossly unremarkable. Vascular/Lymphatic: Aorta normal caliber. Bulky upper abdominal and retroperitoneal adenopathy is unchanged since recent study. Reproductive: Uterus and adnexa unremarkable.  No mass. Other: No free fluid or free air. Musculoskeletal: No acute bony abnormality. IMPRESSION: Numerous spiculated nodule masses in the lower lungs unchanged. Heterogeneous, ill-defined low-density areas in the liver compatible metastases, unchanged. Bulky upper abdominal and retroperitoneal adenopathy, unchanged. No renal or ureteral stones.  No hydronephrosis. Electronically Signed   By: Charlett Nose M.D.   On: 03/01/2022 20:00   NM PET  Image Initial (PI) Skull Base To Thigh (F-18 FDG)  Result Date: 02/13/2022 CLINICAL DATA:  Initial treatment strategy for hematologic malignancy. EXAM: NUCLEAR MEDICINE PET SKULL BASE TO THIGH TECHNIQUE: 11.08 mCi F-18 FDG was injected intravenously. Full-ring PET imaging was performed from the skull base to thigh after the radiotracer. CT data was obtained and used for attenuation correction and anatomic localization. Fasting blood glucose: 84 mg/dl COMPARISON:  Multiple priors including CT February 04, 2022 in Dec 29, 2021 FINDINGS: Mediastinal blood pool activity: SUV max 1.7 Liver activity: SUV max 4.2 NECK: Hypermetabolic bilateral cervical and  supraclavicular lymph nodes. For reference: -hypermetabolic left parotid lymph node measures 11 x 7 mm on image 25/3 with a max SUV of 15.3. -hypermetabolic right level 2 B lymph node measures 13 x 8 mm on image 29/3 with a max SUV of 21.6 -hypermetabolic left supraclavicular lymph node measures 10 x 6 mm on image 52/3 with a max SUV of 13.5 Asymmetric hypermetabolic hyperplasia of the right tonsils with a max SUV of 12.7. Incidental CT findings: Non hypermetabolic partial opacification of the bilateral maxillary sinuses and ethmoid air cells. CHEST: Hypermetabolic mediastinal, hilar, axillary, internal mammary, and pericardiophrenic lymph nodes. For reference: -hypermetabolic precarinal lymph node measures 19 x 15 mm on image 78/3 with a max SUV of 8.7. -hypermetabolic right pericardiophrenic lymph node measures 21 x 11 mm on image 108/3 with a max SUV of 17 Bilateral hypermetabolic spiculated pulmonary nodules some of which demonstrate central cavitation. For reference: -hypermetabolic centrally cavitary spiculated left upper lobe pulmonary nodule measures 3.4 x 1.9 cm on image 34/7 with a max SUV of 8.2. -hypermetabolic partially cavitary spiculated right upper lobe pulmonary nodule measures 4.5 x 3.0 cm on image 30/7 with a max SUV of 10.9 Incidental CT findings: none ABDOMEN/PELVIS: Scattered foci hypermetabolic activity greater than that of background liver for instance in the central right lobe of the liver with a max SUV of 5.0 possibly corresponding with a 16 mm hypodense area on CT on image 124/3, but difficult to evaluate given streak artifact from arms down positioning and lack of intravenous contrast material. Hypermetabolic retroperitoneal, periportal, iliac side chain and pelvic sidewall lymph nodes. For reference: -hypermetabolic portacaval lymph node measures 33 x 22 mm on image 143/3 with a max SUV of 23.7. -hypermetabolic left common iliac lymph node measures 14 x 10 mm on image 197/3 with a max  SUV of 20.9 -hypermetabolic right pelvic sidewall lymph node measures 27 x 13 mm on image 223/3 with a max SUV of 31.8 No splenomegaly. Homogeneous radiotracer uptake throughout the spleen with a max SUV of 4.5 Incidental CT findings: None. SKELETON: No focal hypermetabolic activity to suggest skeletal metastasis. Hypermetabolic subcutaneous soft tissue foci in the bilateral upper extremities common likely reflecting subcutaneous lymph nodes/lymphomatous deposits. For reference: -hypermetabolic soft tissue nodule in the mid left upper extremity measures 14 x 11 mm on image 124/3 with a max SUV of 15.5 Incidental CT findings: No aggressive lytic or blastic lesion of bone. IMPRESSION: 1. Hypermetabolic adenopathy above and below the diaphragm consistent with given diagnosis of hematologic malignancy. 2. Hypermetabolic subcutaneous foci in the upper extremities likely reflect lymph nodes or lymphomatous deposits and consistent with disease involvement. 3. Hypermetabolic spiculated pulmonary nodules some of which demonstrate central cavitation, consistent with disease involvement. 4. Asymmetric right-sided tonsillar hypermetabolic activity, is most consistent with disease involvement. 5. Hypermetabolic hepatic foci slightly greater than that of background liver with a few possible corresponding hypodense hepatic lesions, which  are difficult to evaluate on this study. Given the relative low-level of activity particularly in comparison to the other sites of involvement this is not favored to reflect lymphomatous disease involvement. However, it is not technically excluded on this study and would suggest further evaluation with hepatic protocol MRI with and without contrast. 6. No splenomegaly with homogeneous radiotracer uptake throughout the spleen. 7. No evidence of osseous lymphomatous involvement. Electronically Signed   By: Maudry Mayhew M.D.   On: 02/13/2022 13:34   CT ABDOMEN PELVIS W CONTRAST  Result Date:  02/04/2022 CLINICAL DATA:  Right lower back and flank pain, suspected metastatic malignancy with pulmonary nodules and cavitary lung lesions, abdominal lymphadenopathy * Tracking Code: BO * EXAM: CT ABDOMEN AND PELVIS WITH CONTRAST CT LUMBAR SPINE WITH CONTRAST TECHNIQUE: Multidetector CT imaging of the abdomen and pelvis was performed using the standard protocol following bolus administration of intravenous contrast. Multidetector CT imaging of the lumbar spine was performed using the standard protocol following bolus administration of intravenous contrast. RADIATION DOSE REDUCTION: This exam was performed according to the departmental dose-optimization program which includes automated exposure control, adjustment of the mA and/or kV according to patient size and/or use of iterative reconstruction technique. CONTRAST:  OMNIPAQUE IOHEXOL 300 MG/ML  SOLN COMPARISON:  CT chest abdomen pelvis, 12/29/2021 FINDINGS: CT ABDOMEN PELVIS FINDINGS Lower chest: Multiple spiculated appearing masses and nodules throughout the included lower lungs, several of the largest of which are cavitary and not significantly changed compared to prior examination dated 12/29/2021. Hepatobiliary: Multiple subtle, ill-defined liver lesions, for example adjacent to the vena cava, hepatic segment V/VI, measuring approximately 1.8 x 1.2 cm (series 504, image 21) and a lesion of the hepatic dome, segment VII, measuring 4.0 x 4.0 cm (series 504, image 9). No gallstones, gallbladder wall thickening, or biliary dilatation. Pancreas: Unremarkable. No pancreatic ductal dilatation or surrounding inflammatory changes. Spleen: Normal in size without significant abnormality. Adrenals/Urinary Tract: Adrenal glands are unremarkable. Kidneys are normal, without renal calculi, solid lesion, or hydronephrosis. Bladder is unremarkable. Stomach/Bowel: Stomach is within normal limits. Appendix appears normal. No evidence of bowel wall thickening, distention,  or inflammatory changes. Vascular/Lymphatic: No significant vascular findings are present. Bulky porta hepatis, portacaval, retroperitoneal, and gastrohepatic ligament lymphadenopathy, unchanged compared to prior examination, largest portacaval node measuring 3.8 x 2.6 cm (series 504, image 32). Additional prominent pelvic sidewall, iliac, and inguinal lymph nodes, largest left pelvic sidewall node measuring 3.2 x 1.2 cm (series 504, image 77). Reproductive: Fluid and or endometrial thickening in the endometrial cavity (series 507, image 65). Other: No abdominal wall hernia or abnormality. No ascites. Musculoskeletal: No acute or significant osseous findings. CT LUMBAR SPINE FINDINGS Alignment: Normal lumbar lordosis. Vertebral bodies: Intact.  No suspicious osseous lesions. Disc spaces: Intact. Paraspinous soft tissues: Unremarkable. IMPRESSION: 1. Multiple spiculated appearing masses and nodules throughout the included lower lungs, several of the largest of which are cavitary and not significantly changed, highly suspicious for pulmonary metastatic disease. 2. Bulky abdominal and pelvic lymphadenopathy, unchanged compared to prior examination, most consistent with nodal metastatic disease. 3. Multiple subtle, ill-defined liver lesions, incompletely characterized although highly suspicious for metastatic disease. These are less clearly appreciated on prior examination, appearance possibly very sensitive to exact phase of contrast administration. Contrast enhanced MRI could be used to more clearly characterize liver lesions if desired. 4. Fluid and or endometrial thickening in the endometrial cavity. This is at least a potential etiology of primary malignancy. Consider pelvic ultrasound to further evaluate. 5. No fracture  or dislocation of the lumbar spine. No suspicious osseous lesions by CT. No findings to explain pain. Electronically Signed   By: Jearld Lesch M.D.   On: 02/04/2022 19:09   CT L-SPINE NO  CHARGE  Result Date: 02/04/2022 CLINICAL DATA:  Right lower back and flank pain, suspected metastatic malignancy with pulmonary nodules and cavitary lung lesions, abdominal lymphadenopathy * Tracking Code: BO * EXAM: CT ABDOMEN AND PELVIS WITH CONTRAST CT LUMBAR SPINE WITH CONTRAST TECHNIQUE: Multidetector CT imaging of the abdomen and pelvis was performed using the standard protocol following bolus administration of intravenous contrast. Multidetector CT imaging of the lumbar spine was performed using the standard protocol following bolus administration of intravenous contrast. RADIATION DOSE REDUCTION: This exam was performed according to the departmental dose-optimization program which includes automated exposure control, adjustment of the mA and/or kV according to patient size and/or use of iterative reconstruction technique. CONTRAST:  OMNIPAQUE IOHEXOL 300 MG/ML  SOLN COMPARISON:  CT chest abdomen pelvis, 12/29/2021 FINDINGS: CT ABDOMEN PELVIS FINDINGS Lower chest: Multiple spiculated appearing masses and nodules throughout the included lower lungs, several of the largest of which are cavitary and not significantly changed compared to prior examination dated 12/29/2021. Hepatobiliary: Multiple subtle, ill-defined liver lesions, for example adjacent to the vena cava, hepatic segment V/VI, measuring approximately 1.8 x 1.2 cm (series 504, image 21) and a lesion of the hepatic dome, segment VII, measuring 4.0 x 4.0 cm (series 504, image 9). No gallstones, gallbladder wall thickening, or biliary dilatation. Pancreas: Unremarkable. No pancreatic ductal dilatation or surrounding inflammatory changes. Spleen: Normal in size without significant abnormality. Adrenals/Urinary Tract: Adrenal glands are unremarkable. Kidneys are normal, without renal calculi, solid lesion, or hydronephrosis. Bladder is unremarkable. Stomach/Bowel: Stomach is within normal limits. Appendix appears normal. No evidence of bowel wall  thickening, distention, or inflammatory changes. Vascular/Lymphatic: No significant vascular findings are present. Bulky porta hepatis, portacaval, retroperitoneal, and gastrohepatic ligament lymphadenopathy, unchanged compared to prior examination, largest portacaval node measuring 3.8 x 2.6 cm (series 504, image 32). Additional prominent pelvic sidewall, iliac, and inguinal lymph nodes, largest left pelvic sidewall node measuring 3.2 x 1.2 cm (series 504, image 77). Reproductive: Fluid and or endometrial thickening in the endometrial cavity (series 507, image 65). Other: No abdominal wall hernia or abnormality. No ascites. Musculoskeletal: No acute or significant osseous findings. CT LUMBAR SPINE FINDINGS Alignment: Normal lumbar lordosis. Vertebral bodies: Intact.  No suspicious osseous lesions. Disc spaces: Intact. Paraspinous soft tissues: Unremarkable. IMPRESSION: 1. Multiple spiculated appearing masses and nodules throughout the included lower lungs, several of the largest of which are cavitary and not significantly changed, highly suspicious for pulmonary metastatic disease. 2. Bulky abdominal and pelvic lymphadenopathy, unchanged compared to prior examination, most consistent with nodal metastatic disease. 3. Multiple subtle, ill-defined liver lesions, incompletely characterized although highly suspicious for metastatic disease. These are less clearly appreciated on prior examination, appearance possibly very sensitive to exact phase of contrast administration. Contrast enhanced MRI could be used to more clearly characterize liver lesions if desired. 4. Fluid and or endometrial thickening in the endometrial cavity. This is at least a potential etiology of primary malignancy. Consider pelvic ultrasound to further evaluate. 5. No fracture or dislocation of the lumbar spine. No suspicious osseous lesions by CT. No findings to explain pain. Electronically Signed   By: Jearld Lesch M.D.   On: 02/04/2022 19:09    DG Chest 2 View  Result Date: 02/04/2022 CLINICAL DATA:  Back and chest pain.  Recent diagnosis of pneumonia.  EXAM: CHEST - 2 VIEW COMPARISON:  Chest two views 01/16/2022, 12/29/2021; CT chest 12/29/2021; CT abdomen and pelvis 12/29/2021 FINDINGS: Cardiac silhouette and mediastinal contours are within normal limits. There again patchy and nodular opacities within the right mid lung, inferior medial right lung, and left mid and lower lung diffusely. Some of these are slightly more well-defined and this may be secondary to evolution of the numerous cavitary lesion seen on prior CT. No definite pleural effusion is seen. No pneumothorax. No acute skeletal abnormality. IMPRESSION: Redemonstration of bilateral nodular and irregular densities predominantly within the left mid and lower lung and right mid lung. These were seen to be cavitary on prior CT. Differential considerations again include septic emboli, other multifocal infection,. Note is again made of mediastinal, bilateral hilar, and porta hepatis lymphadenopathy on the 12/29/2021 CT which raises suspicion for metastatic disease. Recommend continued attention on follow-up. Note is made of 12/29/2021 CT abdomen and pelvis Impression: "Follow-up PET-CT and biopsy as warranted should be considered." Electronically Signed   By: Neita Garnet M.D.   On: 02/04/2022 15:49    Labs:  CBC: Recent Labs    01/16/22 1643 02/04/22 1552 02/06/22 1049 03/01/22 1710  WBC 6.4 4.8 4.8 5.0  HGB 14.0 11.8* 13.4 13.1  HCT 44.2 37.4 41.7 40.8  PLT 268 254 277 281    COAGS: No results for input(s): "INR", "APTT" in the last 8760 hours.  BMP: Recent Labs    01/16/22 1643 02/04/22 1552 02/06/22 1049 03/01/22 1710  NA 138 135 136 139  K 4.0 3.8 3.8 3.6  CL 109 105 102 107  CO2 23 21* 24 25  GLUCOSE 105* 99 85 92  BUN 10 12 7 8   CALCIUM 9.6 8.8* 9.1 9.1  CREATININE 0.78 0.92 0.85 0.84  GFRNONAA >60 >60 >60 >60    LIVER FUNCTION TESTS: Recent Labs     12/30/21 0543 02/04/22 1552 02/06/22 1049 03/01/22 1710  BILITOT 0.7 0.4 0.6 0.5  AST 29 24 35 32  ALT 21 19 24 20   ALKPHOS 83 85 98 97  PROT 7.6 7.2 8.0 7.8  ALBUMIN 3.0* 3.1* 3.5 3.2*    TUMOR MARKERS: No results for input(s): "AFPTM", "CEA", "CA199", "CHROMGRNA" in the last 8760 hours.  Assessment and Plan: History of depression, epilepsy, tobacco abuse and drug use. Pt was recently diagnosed with lung masses after presenting to ED 12/29/21 c/o chest pain. CT chest showed multiple irregular densities noted throughout both lungs. Pt was referred to IR for lymph node biopsy by Dr. for generalized lymphadenopathy. Dr. 02/28/22 approved inguinal lymph node biopsy.   Risks and benefits of inguinal lymph node biopsy with moderate sedation was discussed with the patient and/or patient's family including, but not limited to bleeding, infection, damage to adjacent structures or low yield requiring additional tests.  All of the questions were answered and there is agreement to proceed.  Consent signed and in chart.   Thank you for this interesting consult.  I greatly enjoyed meeting [facility] and look forward to participating in their care.  A copy of this report was sent to the requesting provider on this date.  Electronically Signed: Elby Showers, NP 03/05/2022, 10:55 AM   I spent a total of {New Shon Hough {New Out-Pt:[id number]}  {Established Out-Pt:[id number]} in face to face in clinical consultation, greater than 50% of which was counseling/coordinating care for ***

## 2022-03-12 ENCOUNTER — Inpatient Hospital Stay (HOSPITAL_COMMUNITY): Payer: Medicaid Other | Admitting: Hematology

## 2022-03-18 ENCOUNTER — Encounter (HOSPITAL_COMMUNITY): Payer: Self-pay

## 2022-03-18 NOTE — Progress Notes (Signed)
Attempted to reach the patient to reschedule appt with Dr. Ellin Saba following biopsy, unable to reach the patient at this time. Detailed VM left asking that the patient return my call.

## 2022-03-23 ENCOUNTER — Ambulatory Visit: Payer: Medicaid Other | Admitting: Gastroenterology

## 2022-03-24 ENCOUNTER — Other Ambulatory Visit (HOSPITAL_COMMUNITY): Payer: Self-pay | Admitting: Student

## 2022-03-24 NOTE — Progress Notes (Signed)
Call placed to patient today to confirm that patient will be able to undergo biopsy on 03/26/22. Patient confirms that she is aware of biopsy and plans to go to that appt as scheduled. Patient scheduled for follow-up after biopsy with Dr. Ellin Saba on 04/02/22. Patient aware. No further questions at this time.

## 2022-03-25 ENCOUNTER — Other Ambulatory Visit: Payer: Self-pay | Admitting: Radiology

## 2022-03-26 ENCOUNTER — Ambulatory Visit (HOSPITAL_COMMUNITY)
Admission: RE | Admit: 2022-03-26 | Discharge: 2022-03-26 | Disposition: A | Discharge status: Home / Self Care | Payer: Medicaid Other | Source: Ambulatory Visit | Attending: Hematology | Admitting: Hematology

## 2022-03-26 ENCOUNTER — Other Ambulatory Visit: Payer: Self-pay

## 2022-03-26 ENCOUNTER — Encounter (HOSPITAL_COMMUNITY): Payer: Self-pay | Admitting: *Deleted

## 2022-03-26 ENCOUNTER — Other Ambulatory Visit: Payer: Medicaid Other

## 2022-03-26 DIAGNOSIS — R591 Generalized enlarged lymph nodes: Secondary | ICD-10-CM | POA: Diagnosis not present

## 2022-03-26 DIAGNOSIS — F1490 Cocaine use, unspecified, uncomplicated: Secondary | ICD-10-CM

## 2022-03-26 LAB — PROTIME-INR
INR: 1.2 (ref 0.8–1.2)
Prothrombin Time: 15.3 seconds — ABNORMAL HIGH (ref 11.4–15.2)

## 2022-03-26 LAB — CBC
HCT: 40.9 % (ref 36.0–46.0)
Hemoglobin: 12.9 g/dL (ref 12.0–15.0)
MCH: 28.4 pg (ref 26.0–34.0)
MCHC: 31.5 g/dL (ref 30.0–36.0)
MCV: 90.1 fL (ref 80.0–100.0)
Platelets: 217 10*3/uL (ref 150–400)
RBC: 4.54 MIL/uL (ref 3.87–5.11)
RDW: 13.2 % (ref 11.5–15.5)
WBC: 4.9 10*3/uL (ref 4.0–10.5)
nRBC: 0 % (ref 0.0–0.2)

## 2022-03-26 MED ORDER — FENTANYL CITRATE (PF) 100 MCG/2ML IJ SOLN
INTRAMUSCULAR | Status: AC | PRN
  Administered 2022-03-26: 50 ug via INTRAVENOUS

## 2022-03-26 MED ORDER — MIDAZOLAM HCL 2 MG/2ML IJ SOLN
INTRAMUSCULAR | Status: AC
  Filled 2022-03-26: qty 2

## 2022-03-26 MED ORDER — FENTANYL CITRATE (PF) 100 MCG/2ML IJ SOLN
INTRAMUSCULAR | Status: AC
  Filled 2022-03-26: qty 2

## 2022-03-26 MED ORDER — MIDAZOLAM HCL 2 MG/2ML IJ SOLN
INTRAMUSCULAR | Status: AC | PRN
  Administered 2022-03-26: 1 mg via INTRAVENOUS

## 2022-03-26 MED ORDER — SODIUM CHLORIDE 0.9 % IV SOLN
INTRAVENOUS | Status: DC
  Administered 2022-03-26: 10 mL/h via INTRAVENOUS

## 2022-03-26 MED ORDER — HYDROCODONE-ACETAMINOPHEN 5-325 MG PO TABS
2.0000 | ORAL_TABLET | Freq: Once | ORAL | Status: AC
  Administered 2022-03-26: 2 via ORAL

## 2022-03-26 MED ORDER — HYDROCODONE-ACETAMINOPHEN 5-325 MG PO TABS
ORAL_TABLET | ORAL | Status: AC
  Filled 2022-03-26: qty 2

## 2022-03-26 MED ORDER — LIDOCAINE HCL (PF) 1 % IJ SOLN
INTRAMUSCULAR | Status: AC
  Filled 2022-03-26: qty 30

## 2022-03-26 NOTE — Procedures (Signed)
Interventional Radiology Procedure Note  Procedure: US guided right inguinal lymph node biopsy  Indication: Metastatic lymph adenopathy  Findings: Please refer to procedural dictation for full description.  Complications: None  EBL: < 10 mL  Acquanetta Belling, MD [phone]

## 2022-03-26 NOTE — Progress Notes (Signed)
Pt complains of 10 of 10 back pain and is crying saying she cannot sit up.  Jamie,PA for radiology notified.  Orders received. Vicodin given.  See [facility]

## 2022-03-26 NOTE — Progress Notes (Signed)
Pt states she feels a little better.  States her pain is a 6 now

## 2022-03-26 NOTE — Progress Notes (Signed)
UDS order placed per Dr. Ellin Saba

## 2022-03-26 NOTE — H&P (Signed)
Chief Complaint: Patient was seen in consultation today for lymph node biopsy   Referring Physician(s): Katragadda,Sreedhar  Supervising Physician: Mir, Sharen Heck  Patient Status: [facility] - Out-pt  History of Present Illness: Chelsey Sullivan is a 36 y.o. female with a medical history significant for epilepsy, depression, remote substance abuse and recently identified lymphadenopathy concerning for a hematological malignancy. She first presented to the ED 12/29/21 with complaints of chest pain and shortness of breath. Imaging showed multiple irregular densities noted throughout both lungs - many which were cavitary- and extensive adenopathy. The team was unsure if these findings were from septic emboli, multifocal infection or metastatic disease. She was evaluated by Pulmonary while inpatient and plans were made for outpatient follow up. She was also referred to Oncology. She was discharged home 01/04/22.   She met with Oncology 02/06/22 and additional imaging was ordered.   PET 02/12/22 IMPRESSION: 1. Hypermetabolic adenopathy above and below the diaphragm consistent with given diagnosis of hematologic malignancy. 2. Hypermetabolic subcutaneous foci in the upper extremities likely reflect lymph nodes or lymphomatous deposits and consistent with disease involvement. 3. Hypermetabolic spiculated pulmonary nodules some of which demonstrate central cavitation, consistent with disease involvement. 4. Asymmetric right-sided tonsillar hypermetabolic activity, is most consistent with disease involvement. 5. Hypermetabolic hepatic foci slightly greater than that of background liver with a few possible corresponding hypodense hepatic lesions, which are difficult to evaluate on this study. Given the relative low-level of activity particularly in comparison to the other sites of involvement this is not favored to reflect lymphomatous disease involvement. However, it is not technically excluded on this  study and would suggest further evaluation with hepatic protocol MRI with and without contrast. 6. No splenomegaly with homogeneous radiotracer uptake throughout the spleen. 7. No evidence of osseous lymphomatous involvement.  Interventional Radiology has been asked to evaluate this patient for an image-guided lymph node biopsy. Imaging reviewed and procedure approved by Dr. Serafina Royals.   Past Medical History:  Diagnosis Date   Cocaine abuse ([place])    Depression    Epilepsy ([place])    UTI (urinary tract infection)     Past Surgical History:  Procedure Laterality Date   ESOPHAGOGASTRODUODENOSCOPY (EGD) WITH PROPOFOL N/A 01/04/2022   Procedure: ESOPHAGOGASTRODUODENOSCOPY (EGD) WITH PROPOFOL;  Surgeon: Eloise Harman, DO;  [facility]: [facility];  Service: Endoscopy;  Laterality: N/A;   HERNIA REPAIR     umbilical hernia repair x 2.    Allergies: Morphine, Nsaids, Amoxicillin, and Penicillins  Medications: Prior to Admission medications   Medication Sig Start Date End Date Taking? Authorizing Provider  albuterol (VENTOLIN HFA) 108 (90 Base) MCG/ACT inhaler Inhale 2 puffs into the lungs every 6 (six) hours as needed for wheezing or shortness of breath. 06/26/19  Yes Emokpae, Courage, MD  HYDROcodone-acetaminophen (NORCO/VICODIN) 5-325 MG tablet Take 2 tablets by mouth every 4 (four) hours as needed. 02/05/22  Yes Kommor, Madison, MD  levETIRAcetam (KEPPRA) 500 MG tablet Take 1 tablet (500 mg total) by mouth 2 (two) times daily. 06/26/19  Yes Emokpae, Courage, MD  predniSONE (DELTASONE) 10 MG tablet Take 6 tablets by mouth daily x1 day; then 5 tablet by mouth daily x1 day; then 4 tablets by mouth daily x2 days; then 3 tablets by mouth daily x2 days; then 2 tablets by mouth daily x3 days; then 1 tablet by mouth daily x3 days and stop prednisone. Patient taking differently: Take 10 mg by mouth daily. 01/04/22  Yes Barton Dubois, MD  morphine (MSIR) 30 MG tablet Take  1 tablet (30 mg total) by  mouth every 8 (eight) hours as needed for severe pain. Patient not taking: Reported on 03/25/2022 03/01/22   Varney Biles, MD  naproxen (NAPROSYN) 500 MG tablet Take 1 tablet (500 mg total) by mouth 2 (two) times daily. Patient not taking: Reported on 03/25/2022 03/01/22   Varney Biles, MD  traMADol (ULTRAM) 50 MG tablet Take 1 tablet (50 mg total) by mouth every 12 (twelve) hours as needed. Patient not taking: Reported on 02/16/2022 02/06/22   Derek Jack, MD  ferrous sulfate 325 (65 FE) MG tablet Take 1 tablet (325 mg total) by mouth 2 (two) times daily with a meal. Patient not taking: Reported on 08/04/2019 06/26/19 10/15/19  Roxan Hockey, MD     Family History  Problem Relation Age of Onset   Hypertension Other    Diabetes Other    Epilepsy Other    Seizures Sister    Hypertension Maternal Grandmother    Kidney disease Maternal Grandmother     Social History   Socioeconomic History   Marital status: Legally Separated    Spouse name: Jonathon   Number of children: 6   Years of education: Not on file   Highest education level: Not on file  Occupational History   Occupation: unemployed  Tobacco Use   Smoking status: Every Day    Packs/day: 1.00    Types: Cigarettes   Smokeless tobacco: Never  Vaping Use   Vaping Use: Never used  Substance and Sexual Activity   Alcohol use: Yes    Comment: occ   Drug use: Not Currently    Types: Cocaine   Sexual activity: Yes    Birth control/protection: None  Other Topics Concern   Not on file  Social History Narrative   Not on file   Social Determinants of Health   Financial Resource Strain: Low Risk  (06/24/2019)   Overall Financial Resource Strain (CARDIA)    Difficulty of Paying Living Expenses: Not very hard  Food Insecurity: Unknown (06/24/2019)   Hunger Vital Sign    Worried About [place] in the Last Year: Patient refused    [place] in the Last Year: Patient refused  Transportation Needs:  Unknown (06/24/2019)   [place] - Hydrologist (Medical): Patient refused    Lack of Transportation (Non-Medical): Patient refused  Physical Activity: Unknown (06/24/2019)   Exercise Vital Sign    Days of Exercise per Week: Patient refused    Minutes of Exercise per Session: Patient refused  Stress: Not on file  Social Connections: Unknown (06/24/2019)   Social Connection and Isolation Panel [NHANES]    Frequency of Communication with Friends and Family: Patient refused    Frequency of Social Gatherings with Friends and Family: Patient refused    Attends Religious Services: Patient refused    Marine scientist or Organizations: Patient refused    Attends Archivist Meetings: Patient refused    Marital Status: Patient refused    Review of Systems: A 12 point ROS discussed and pertinent positives are indicated in the HPI above.  All other systems are negative.  Review of Systems  Constitutional:  Positive for appetite change and fatigue.  Respiratory:  Negative for cough and shortness of breath.   Cardiovascular:  Negative for chest pain and leg swelling.  Gastrointestinal:  Negative for abdominal pain, diarrhea, nausea and vomiting.  Musculoskeletal:  Positive for back pain.  Neurological:  Negative  for dizziness and headaches.    Vital Signs: BP 109/74   Pulse 87   Temp 98.3 F (36.8 C) (Oral)   Resp 14   Ht 5' 9"  (1.753 m)   Wt 211 lb (95.7 kg)   LMP 02/26/2022 Comment: tubal 2021  SpO2 94%   BMI 31.16 kg/m   Physical Exam Constitutional:      General: She is not in acute distress.    Appearance: She is not ill-appearing.  HENT:     Mouth/Throat:     Mouth: Mucous membranes are moist.     Pharynx: Oropharynx is clear.  Cardiovascular:     Rate and Rhythm: Normal rate and regular rhythm.     Pulses: Normal pulses.     Heart sounds: Normal heart sounds.  Pulmonary:     Effort: Pulmonary effort is normal.     Breath  sounds: Normal breath sounds.  Abdominal:     General: Bowel sounds are normal.     Palpations: Abdomen is soft.  Musculoskeletal:     Right lower leg: No edema.     Left lower leg: No edema.  Skin:    General: Skin is warm and dry.  Neurological:     Mental Status: She is alert and oriented to person, place, and time.     Imaging: CT ABDOMEN PELVIS W CONTRAST  Result Date: 03/01/2022 CLINICAL DATA:  Metastatic disease evaluation also has flank pain. EXAM: CT ABDOMEN AND PELVIS WITH CONTRAST TECHNIQUE: Multidetector CT imaging of the abdomen and pelvis was performed using the standard protocol following bolus administration of intravenous contrast. RADIATION DOSE REDUCTION: This exam was performed according to the departmental dose-optimization program which includes automated exposure control, adjustment of the mA and/or kV according to patient size and/or use of iterative reconstruction technique. CONTRAST:  159m OMNIPAQUE IOHEXOL 300 MG/ML  SOLN COMPARISON:  PET CT 02/12/2022.  CT abdomen and pelvis 02/04/2022. FINDINGS: Lower chest: Multiple spiculated nodules and masses, some cavitary again noted in the lower lungs, unchanged since recent study. No effusions. Hepatobiliary: Heterogeneous, ill-defined low-density areas throughout the liver again noted. Gallbladder contracted. Pancreas: No focal abnormality or ductal dilatation. Spleen: No focal abnormality.  Normal size. Adrenals/Urinary Tract: No adrenal abnormality. No focal renal abnormality. No stones or hydronephrosis. Urinary bladder is unremarkable. Stomach/Bowel: Normal appendix. Stomach, large and small bowel grossly unremarkable. Vascular/Lymphatic: Aorta normal caliber. Bulky upper abdominal and retroperitoneal adenopathy is unchanged since recent study. Reproductive: Uterus and adnexa unremarkable.  No mass. Other: No free fluid or free air. Musculoskeletal: No acute bony abnormality. IMPRESSION: Numerous spiculated nodule masses in  the lower lungs unchanged. Heterogeneous, ill-defined low-density areas in the liver compatible metastases, unchanged. Bulky upper abdominal and retroperitoneal adenopathy, unchanged. No renal or ureteral stones.  No hydronephrosis. Electronically Signed   By: KRolm BaptiseM.D.   On: 03/01/2022 20:00    Labs:  CBC: Recent Labs    01/16/22 1643 02/04/22 1552 02/06/22 1049 03/01/22 1710  WBC 6.4 4.8 4.8 5.0  HGB 14.0 11.8* 13.4 13.1  HCT 44.2 37.4 41.7 40.8  PLT 268 254 277 281    COAGS: No results for input(s): "INR", "APTT" in the last 8760 hours.  BMP: Recent Labs    01/16/22 1643 02/04/22 1552 02/06/22 1049 03/01/22 1710  NA 138 135 136 139  K 4.0 3.8 3.8 3.6  CL 109 105 102 107  CO2 23 21* 24 25  GLUCOSE 105* 99 85 92  BUN 10 12 7  8  CALCIUM 9.6 8.8* 9.1 9.1  CREATININE 0.78 0.92 0.85 0.84  GFRNONAA >60 >60 >60 >60    LIVER FUNCTION TESTS: Recent Labs    12/30/21 0543 02/04/22 1552 02/06/22 1049 03/01/22 1710  BILITOT 0.7 0.4 0.6 0.5  AST 29 24 35 32  ALT 21 19 24 20   ALKPHOS 83 85 98 97  PROT 7.6 7.2 8.0 7.8  ALBUMIN 3.0* 3.1* 3.5 3.2*    TUMOR MARKERS: No results for input(s): "AFPTM", "CEA", "CA199", "CHROMGRNA" in the last 8760 hours.  Assessment and Plan:  Lymphadenopathy concerning for hematologic malignancy: Bradly Bienenstock, 36 year old female, presents today to the [facility] department for an image-guided lymph node biopsy.  Risks and benefits of this procedure were discussed with the patient and/or patient's family including, but not limited to bleeding, infection, damage to adjacent structures or low yield requiring additional tests.  All of the questions were answered and there is agreement to proceed. She has been NPO.   Consent signed and in chart.  Thank you for this interesting consult.  I greatly enjoyed meeting [facility] and look forward to participating in their care.  A copy of this report was sent to  the requesting provider on this date.  Electronically Signed: Soyla Dryer, AGACNP-BC [phone] 03/26/2022, 12:30 PM   I spent a total of  30 Minutes   in face to face in clinical consultation, greater than 50% of which was counseling/coordinating care for lymph node biopsy

## 2022-03-26 NOTE — Progress Notes (Signed)
Dr Marice Potter office notified of pt's request for pain control

## 2022-03-27 ENCOUNTER — Other Ambulatory Visit: Payer: Self-pay

## 2022-03-27 ENCOUNTER — Other Ambulatory Visit (HOSPITAL_COMMUNITY): Payer: Self-pay | Admitting: Hematology

## 2022-03-27 ENCOUNTER — Inpatient Hospital Stay: Payer: Medicaid Other | Attending: Hematology

## 2022-03-27 DIAGNOSIS — F1490 Cocaine use, unspecified, uncomplicated: Secondary | ICD-10-CM

## 2022-03-27 DIAGNOSIS — R918 Other nonspecific abnormal finding of lung field: Secondary | ICD-10-CM | POA: Insufficient documentation

## 2022-03-27 LAB — RAPID URINE DRUG SCREEN, HOSP PERFORMED
Amphetamines: NOT DETECTED
Barbiturates: NOT DETECTED
Benzodiazepines: NOT DETECTED
Cocaine: NOT DETECTED
Opiates: NOT DETECTED
Tetrahydrocannabinol: NOT DETECTED

## 2022-03-27 MED ORDER — HYDROCODONE-ACETAMINOPHEN 5-325 MG PO TABS: 1.0000 | ORAL_TABLET | Freq: Four times a day (QID) | ORAL | 0 refills | Status: AC | PRN

## 2022-03-30 LAB — SURGICAL PATHOLOGY

## 2022-04-02 ENCOUNTER — Ambulatory Visit: Payer: Medicaid Other | Admitting: Hematology

## 2022-04-22 ENCOUNTER — Ambulatory Visit: Payer: Medicaid Other | Admitting: Hematology

## 2022-04-28 ENCOUNTER — Inpatient Hospital Stay: Payer: Medicaid Other | Attending: Hematology | Admitting: Hematology

## 2022-04-28 VITALS — BP 120/89 | HR 86 | Temp 97.0°F | Resp 20 | Ht 65.95 in | Wt 200.2 lb

## 2022-04-28 DIAGNOSIS — Z7952 Long term (current) use of systemic steroids: Secondary | ICD-10-CM | POA: Diagnosis not present

## 2022-04-28 DIAGNOSIS — R918 Other nonspecific abnormal finding of lung field: Secondary | ICD-10-CM | POA: Insufficient documentation

## 2022-04-28 DIAGNOSIS — M545 Low back pain, unspecified: Secondary | ICD-10-CM | POA: Insufficient documentation

## 2022-04-28 DIAGNOSIS — Z79899 Other long term (current) drug therapy: Secondary | ICD-10-CM | POA: Insufficient documentation

## 2022-04-28 DIAGNOSIS — G40909 Epilepsy, unspecified, not intractable, without status epilepticus: Secondary | ICD-10-CM | POA: Diagnosis not present

## 2022-04-28 DIAGNOSIS — F1410 Cocaine abuse, uncomplicated: Secondary | ICD-10-CM | POA: Diagnosis not present

## 2022-04-28 DIAGNOSIS — F17210 Nicotine dependence, cigarettes, uncomplicated: Secondary | ICD-10-CM | POA: Insufficient documentation

## 2022-04-28 DIAGNOSIS — Z87440 Personal history of urinary (tract) infections: Secondary | ICD-10-CM | POA: Diagnosis not present

## 2022-04-28 DIAGNOSIS — R591 Generalized enlarged lymph nodes: Secondary | ICD-10-CM | POA: Diagnosis not present

## 2022-04-28 DIAGNOSIS — Z791 Long term (current) use of non-steroidal anti-inflammatories (NSAID): Secondary | ICD-10-CM | POA: Diagnosis not present

## 2022-04-28 NOTE — Progress Notes (Signed)
[facility] [address] [place] [address]   CLINIC:  Medical Oncology/Hematology  CONSULT NOTE  Patient Care Team: Patient, No Pcp Per as PCP - General (General Practice) Doreatha Massed, MD as Medical Oncologist (Medical Oncology) Therese Sarah, RN as Oncology Nurse Navigator (Medical Oncology)  CHIEF COMPLAINTS/PURPOSE OF CONSULTATION:  Evaluation for lung masses and generalized lymphadenopathy  HISTORY OF PRESENTING ILLNESS:  [name] 36 y.o. female seen for follow-up of lymph node biopsy as well as PET scan.  She complains of having low back pain.  At previous visit I have given tramadol which did not help with her pain.  She reports pain is 7 out of 10.  Energy levels are 50%.  MEDICAL HISTORY:  Past Medical History:  Diagnosis Date   Cocaine abuse ([facility])    Depression    Epilepsy ([facility])    UTI (urinary tract infection)     SURGICAL HISTORY: Past Surgical History:  Procedure Laterality Date   ESOPHAGOGASTRODUODENOSCOPY (EGD) WITH PROPOFOL N/A 01/04/2022   Procedure: ESOPHAGOGASTRODUODENOSCOPY (EGD) WITH PROPOFOL;  Surgeon: Lanelle Bal, DO;  Location: [facility];  Service: Endoscopy;  Laterality: N/A;   HERNIA REPAIR     umbilical hernia repair x 2.    SOCIAL HISTORY: Social History   Socioeconomic History   Marital status: Legally Separated    Spouse name: Jonathon   Number of children: 6   Years of education: Not on file   Highest education level: Not on file  Occupational History   Occupation: unemployed  Tobacco Use   Smoking status: Every Day    Packs/day: 1.00    Types: Cigarettes   Smokeless tobacco: Never  Vaping Use   Vaping Use: Never used  Substance and Sexual Activity   Alcohol use: Yes    Comment: occ   Drug use: Not Currently    Types: Cocaine   Sexual activity: Yes    Birth control/protection: None  Other Topics Concern   Not on file  Social History Narrative   Not on file   Social  Determinants of Health   Financial Resource Strain: Low Risk  (06/24/2019)   Overall Financial Resource Strain (CARDIA)    Difficulty of Paying Living Expenses: Not very hard  Food Insecurity: Unknown (06/24/2019)   Hunger Vital Sign    Worried About Running Out of Food in the Last Year: Patient refused    Ran Out of Food in the Last Year: Patient refused  Transportation Needs: Unknown (06/24/2019)   PRAPARE - Transportation    Lack of Transportation (Medical): Patient refused    Lack of Transportation (Non-Medical): Patient refused  Physical Activity: Unknown (06/24/2019)   Exercise Vital Sign    Days of Exercise per Week: Patient refused    Minutes of Exercise per Session: Patient refused  Stress: Not on file  Social Connections: Unknown (06/24/2019)   Social Connection and Isolation Panel [NHANES]    Frequency of Communication with Friends and Family: Patient refused    Frequency of Social Gatherings with Friends and Family: Patient refused    Attends Religious Services: Patient refused    Active Member of Clubs or Organizations: Patient refused    Attends Banker Meetings: Patient refused    Marital Status: Patient refused  Intimate Partner Violence: Not At Risk (06/24/2019)   Humiliation, Afraid, Rape, and Kick questionnaire    Fear of Current or Ex-Partner: No    Emotionally Abused: No    Physically  Abused: No    Sexually Abused: No    FAMILY HISTORY: Family History  Problem Relation Age of Onset   Hypertension Other    Diabetes Other    Epilepsy Other    Seizures Sister    Hypertension Maternal Grandmother    Kidney disease Maternal Grandmother     ALLERGIES:  is allergic to morphine, nsaids, tramadol, amoxicillin, and penicillins.  MEDICATIONS:  Current Outpatient Medications  Medication Sig Dispense Refill   albuterol (VENTOLIN HFA) 108 (90 Base) MCG/ACT inhaler Inhale 2 puffs into the lungs every 6 (six) hours as needed for wheezing or  shortness of breath. 18 g 1   HYDROcodone-acetaminophen (NORCO/VICODIN) 5-325 MG tablet Take 1 tablet by mouth every 6 (six) hours as needed. 30 tablet 0   levETIRAcetam (KEPPRA) 500 MG tablet Take 1 tablet (500 mg total) by mouth 2 (two) times daily. 60 tablet 6   morphine (MSIR) 30 MG tablet Take 1 tablet (30 mg total) by mouth every 8 (eight) hours as needed for severe pain. 9 tablet 0   naproxen (NAPROSYN) 500 MG tablet Take 1 tablet (500 mg total) by mouth 2 (two) times daily. 20 tablet 0   predniSONE (DELTASONE) 10 MG tablet Take 6 tablets by mouth daily x1 day; then 5 tablet by mouth daily x1 day; then 4 tablets by mouth daily x2 days; then 3 tablets by mouth daily x2 days; then 2 tablets by mouth daily x3 days; then 1 tablet by mouth daily x3 days and stop prednisone. (Patient taking differently: Take 10 mg by mouth daily.) 33 tablet 0   traMADol (ULTRAM) 50 MG tablet Take 1 tablet (50 mg total) by mouth every 12 (twelve) hours as needed. 28 tablet 0   No current facility-administered medications for this visit.    REVIEW OF SYSTEMS:   Review of Systems  Constitutional:  Negative for appetite change, fatigue, fever and unexpected weight change.  Respiratory:  Positive for cough and shortness of breath.   Gastrointestinal:  Positive for nausea and vomiting.  Endocrine: Negative for hot flashes.  Musculoskeletal:  Positive for back pain (7/10).  Neurological:  Positive for headaches.  All other systems reviewed and are negative.    PHYSICAL EXAMINATION: ECOG PERFORMANCE STATUS: 1 - Symptomatic but completely ambulatory  Vitals:   04/28/22 1544  BP: 120/89  Pulse: 86  Resp: 20  Temp: (!) 97 F (36.1 C)  SpO2: 99%   Filed Weights   04/28/22 1544  Weight: 200 lb 2.8 oz (90.8 kg)   Physical Exam Vitals reviewed.  Constitutional:      Appearance: Normal appearance. She is obese.  Cardiovascular:     Rate and Rhythm: Normal rate and regular rhythm.     Pulses: Normal  pulses.     Heart sounds: Normal heart sounds.  Pulmonary:     Effort: Pulmonary effort is normal.     Breath sounds: Normal breath sounds.  Abdominal:     Palpations: Abdomen is soft. There is no hepatomegaly, splenomegaly or mass.     Tenderness: There is no abdominal tenderness.  Lymphadenopathy:     Cervical: No cervical adenopathy.     Right cervical: No superficial cervical adenopathy.    Left cervical: No superficial cervical adenopathy.     Upper Body:     Right upper body: No supraclavicular or axillary adenopathy.     Left upper body: No supraclavicular or axillary adenopathy.     Lower Body: No right inguinal adenopathy. No left  inguinal adenopathy.  Neurological:     General: No focal deficit present.     Mental Status: She is alert and oriented to person, place, and time.  Psychiatric:        Mood and Affect: Mood normal.        Behavior: Behavior normal.      LABORATORY DATA:  I have reviewed the data as listed    Latest Ref Rng & Units 03/26/2022   12:30 PM 03/01/2022    5:10 PM 02/06/2022   10:49 AM  CBC  WBC 4.0 - 10.5 K/uL 4.9  5.0  4.8   Hemoglobin 12.0 - 15.0 g/dL 19.5  09.3  26.7   Hematocrit 36.0 - 46.0 % 40.9  40.8  41.7   Platelets 150 - 400 K/uL 217  281  277       Latest Ref Rng & Units 03/01/2022    5:10 PM 02/06/2022   10:49 AM 02/04/2022    3:52 PM  CMP  Glucose 70 - 99 mg/dL 92  85  99   BUN 6 - 20 mg/dL 8  7  12    Creatinine 0.44 - 1.00 mg/dL  1.24  5.80   Sodium 135 - 145 mmol/L 139  136  135   Potassium 3.5 - 5.1 mmol/L 3.6  3.8  3.8   Chloride 98 - 111 mmol/L 107  102  105   CO2 22 - 32 mmol/L 25  24  21    Calcium 8.9 - 10.3 mg/dL 9.1  9.1  8.8   Total Protein 6.5 - 8.1 g/dL 7.8  8.0  7.2   Total Bilirubin 0.3 - 1.2 mg/dL 0.5  0.6  0.4   Alkaline Phos 38 - 126 U/L 97  98  85   AST 15 - 41 U/L 32  35  24   ALT 0 - 44 U/L 20  24  19      RADIOGRAPHIC STUDIES: I have personally reviewed the radiological images as listed and agreed  with the findings in the report. No results found.  ASSESSMENT:  Lung masses and generalized lymphadenopathy: - 28 pound weight loss in the last 2 months.  No fevers or night sweats.  Nausea with occasional vomiting and inability to keep down meats, hot dogs, fish and shrimp. - Hospitalization from 12/29/2021 - 01/04/2022, presentation with mid chest pain. - CT chest (12/29/2021): Multiple irregular densities noted throughout both lungs, largest 3.3 cm in the right upper lobe and 3.3 x 2.1 cm cavitary abnormality in the left upper lobe.  Mediastinal and hilar adenopathy. - CTAP (12/29/2021): Pathologically enlarged lymph nodes in the upper abdomen, largest 4 x 2.4 cm between portal vein and IVC.  Other enlarged lymph nodes adjacent to the porta hepatis. - Reports cough since 12/29/2021 with yellow sputum.  She had seen slight blood-streaked sputum this morning for the first time. - Complains of low back pain for the last 2 months and leg pains for 2 weeks. - She has regular monthly menses, light lasting 2 to 3 days.   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 - She is current active smoker, 1 pack/day for the last 17 years.  She also uses cocaine and marijuana. - Father had prostate cancer.  Maternal grandmother had cancer.  Maternal aunt had sarcoidosis.   PLAN:  Lung masses and generalized lymphadenopathy: - Blood work reviewed by me shows negative for tumor markers.  ACE level was elevated. -  PET scan on 02/12/2022: Hypermetabolic adenopathy above and below the diaphragm.  Hypermetabolic lung nodules.  Hypermetabolic hepatic foci.  No splenomegaly.  No evidence of bone involvement. - Biopsy of the right inguinal lymph node (03/26/2022): Necrotizing granulomas consistent with sarcoidosis.  No malignancy. - She does not have any convincing evidence of malignancy at this time.  I have recommended referral to sarcoidosis specialist in [place]. - I will be happy to see her  on an as-needed basis.  2.  Low back pain: - PET scan was negative for skeletal lesions.  Likely related to degenerative disc disease.  We will make referral to back specialist.   All questions were answered. The patient knows to call the clinic with any problems, questions or concerns.   Doreatha Massed, MD, 04/28/22 5:42 PM  [facility] [phone]

## 2022-04-28 NOTE — Patient Instructions (Signed)
[facility]  Discharge Instructions  You were seen and examined today by Dr. Ellin Saba.  Dr. Ellin Saba discussed your most recent lab work and PET scan which revealed that you do not have cancer.  Follow-up as needed.    Thank you for choosing  [facility] to provide your oncology and hematology care.   To afford each patient quality time with our provider, please arrive at least 15 minutes before your scheduled appointment time. You may need to reschedule your appointment if you arrive late (10 or more minutes). Arriving late affects you and other patients whose appointments are after yours.  Also, if you miss three or more appointments without notifying the office, you may be dismissed from the clinic at the provider's discretion.    Again, thank you for choosing [facility].  Our hope is that these requests will decrease the amount of time that you wait before being seen by our physicians.   If you have a lab appointment with the Cancer Center please come in thru the Main Entrance and check in at the main information desk.           _____________________________________________________________  Should you have questions after your visit to [facility], please contact our office at [phone] and follow the prompts.  Our office hours are 8:00 a.m. to 4:30 p.m. Monday - Thursday and 8:00 a.m. to 2:30 p.m. Friday.  Please note that voicemails left after 4:00 p.m. may not be returned until the following business day.  We are closed weekends and all major holidays.  You do have access to a nurse 24-7, just call the main number to the clinic [phone] and do not press any options, hold on the line and a nurse will answer the phone.    For prescription refill requests, have your pharmacy contact our office and allow 72 hours.    Masks are optional in the cancer centers. If you would like for your care team  to wear a mask while they are taking care of you, please let them know. You may have one support person who is at least 36 years old accompany you for your appointments.

## 2022-07-31 LAB — HISTOPLASMA ANTIGEN, URINE: Histoplasma Antigen, urine: <0.5 (ref <0.5)

## 2023-03-05 ENCOUNTER — Emergency Department (HOSPITAL_COMMUNITY): Payer: MEDICAID

## 2023-03-05 ENCOUNTER — Other Ambulatory Visit: Payer: Self-pay

## 2023-03-05 ENCOUNTER — Emergency Department (HOSPITAL_COMMUNITY)
Admission: EM | Admit: 2023-03-05 | Discharge: 2023-03-05 | Disposition: A | Discharge status: Home / Self Care | Payer: MEDICAID | Attending: Emergency Medicine | Admitting: Emergency Medicine

## 2023-03-05 ENCOUNTER — Encounter (HOSPITAL_COMMUNITY): Payer: Self-pay | Admitting: Emergency Medicine

## 2023-03-05 DIAGNOSIS — R000 Tachycardia, unspecified: Secondary | ICD-10-CM | POA: Diagnosis not present

## 2023-03-05 DIAGNOSIS — G8929 Other chronic pain: Secondary | ICD-10-CM | POA: Diagnosis not present

## 2023-03-05 DIAGNOSIS — R112 Nausea with vomiting, unspecified: Secondary | ICD-10-CM | POA: Diagnosis not present

## 2023-03-05 DIAGNOSIS — M545 Low back pain, unspecified: Secondary | ICD-10-CM | POA: Insufficient documentation

## 2023-03-05 LAB — COMPREHENSIVE METABOLIC PANEL
ALT: 11 U/L (ref 0–44)
AST: 15 U/L (ref 15–41)
Albumin: 4.1 g/dL (ref 3.5–5.0)
Alkaline Phosphatase: 79 U/L (ref 38–126)
Anion gap: 9 (ref 5–15)
BUN: 9 mg/dL (ref 6–20)
CO2: 23 mmol/L (ref 22–32)
Calcium: 9.2 mg/dL (ref 8.9–10.3)
Chloride: 104 mmol/L (ref 98–111)
Creatinine, Ser: 0.96 mg/dL (ref 0.44–1.00)
GFR, Estimated: >60 mL/min (ref >60)
Glucose, Bld: 107 mg/dL — ABNORMAL HIGH (ref 70–99)
Potassium: 3.5 mmol/L (ref 3.5–5.1)
Sodium: 136 mmol/L (ref 135–145)
Total Bilirubin: 1.2 mg/dL (ref 0.3–1.2)
Total Protein: 8.2 g/dL — ABNORMAL HIGH (ref 6.5–8.1)

## 2023-03-05 LAB — CBC WITH DIFFERENTIAL/PLATELET
Abs Immature Granulocytes: 0.02 10*3/uL (ref 0.00–0.07)
Basophils Absolute: 0 10*3/uL (ref 0.0–0.1)
Basophils Relative: 0 %
Eosinophils Absolute: 0 10*3/uL (ref 0.0–0.5)
Eosinophils Relative: 0 %
HCT: 42.5 % (ref 36.0–46.0)
Hemoglobin: 14.1 g/dL (ref 12.0–15.0)
Immature Granulocytes: 0 %
Lymphocytes Relative: 11 %
Lymphs Abs: 0.9 10*3/uL (ref 0.7–4.0)
MCH: 30.1 pg (ref 26.0–34.0)
MCHC: 33.2 g/dL (ref 30.0–36.0)
MCV: 90.6 fL (ref 80.0–100.0)
Monocytes Absolute: 0.3 10*3/uL (ref 0.1–1.0)
Monocytes Relative: 4 %
Neutro Abs: 6.9 10*3/uL (ref 1.7–7.7)
Neutrophils Relative %: 85 %
Platelets: 253 10*3/uL (ref 150–400)
RBC: 4.69 MIL/uL (ref 3.87–5.11)
RDW: 16.5 % — ABNORMAL HIGH (ref 11.5–15.5)
WBC: 8.2 10*3/uL (ref 4.0–10.5)
nRBC: 0 % (ref 0.0–0.2)

## 2023-03-05 LAB — LIPASE, BLOOD: Lipase: 29 U/L (ref 11–51)

## 2023-03-05 LAB — HCG, QUANTITATIVE, PREGNANCY: hCG, Beta Chain, Quant, S: <1 m[IU]/mL (ref <5)

## 2023-03-05 MED ORDER — SODIUM CHLORIDE 0.9 % IV BOLUS
1000.0000 mL | Freq: Once | INTRAVENOUS | Status: AC
  Administered 2023-03-05: 1000 mL via INTRAVENOUS

## 2023-03-05 MED ORDER — FENTANYL CITRATE PF 50 MCG/ML IJ SOSY
25.0000 ug | PREFILLED_SYRINGE | Freq: Once | INTRAMUSCULAR | Status: AC
  Administered 2023-03-05: 25 ug via INTRAVENOUS
  Filled 2023-03-05: qty 1

## 2023-03-05 MED ORDER — IOHEXOL 300 MG/ML  SOLN
100.0000 mL | Freq: Once | INTRAMUSCULAR | Status: AC | PRN
  Administered 2023-03-05: 100 mL via INTRAVENOUS

## 2023-03-05 MED ORDER — DROPERIDOL 2.5 MG/ML IJ SOLN
1.2500 mg | Freq: Once | INTRAMUSCULAR | Status: AC
  Administered 2023-03-05: 1.25 mg via INTRAVENOUS
  Filled 2023-03-05: qty 2

## 2023-03-05 MED ORDER — ONDANSETRON 4 MG PO TBDP: 4.0000 mg | ORAL_TABLET | Freq: Three times a day (TID) | ORAL | 0 refills | Status: AC | PRN

## 2023-03-05 MED ORDER — KETOROLAC TROMETHAMINE 30 MG/ML IJ SOLN
15.0000 mg | Freq: Once | INTRAMUSCULAR | Status: AC
  Administered 2023-03-05: 15 mg via INTRAVENOUS
  Filled 2023-03-05: qty 1

## 2023-03-05 MED ORDER — LIDOCAINE 5 % EX PTCH
1.0000 | MEDICATED_PATCH | CUTANEOUS | Status: DC
  Administered 2023-03-05: 1 via TRANSDERMAL
  Filled 2023-03-05: qty 1

## 2023-03-05 NOTE — Discharge Instructions (Addendum)
    Providers Accepting New Patients in [place], [place]    [facility] [address]  [place], [place] [address]X [phone] Accepts most insurances  [facility] [address] [place], [place] [address] [phone] Accepts most insurances  [facility] [address] [place], [place] [address]  [phone] Must meet requirements  [facility] [address] [place], [place] [address] [phone] Accepts most insurances  [facility] [address]  [place], [place] [address] [phone] Accepts most insurances  [facility] [address]   [place], [place]   [phone] Accepts most insurances  [facility])  [phone] [address]  [place], [place] [address] [phone] Accepts most insurances     [facility] [address]  [place], [place] [address] [phone] Accepts most insurances  [facility] [address] [place], [place] [address] [phone] option 1 Accepts Medicaid and [facility] [address]  [place], [place] [address] [phone] Accepts most insurances  Avon Gully, MD [address] [place], [place] [address] [phone] Accepts most insurances  [facility] [address]  [place], [place] [address] [phone] Accepts most insurances  [facility] [phone] [place], [place] [address] [phone] Accepts most insurances  [place] [address] [place], [place] [address] [phone]  Accepts most insurances        You have been seen today for your complaint of dizziness and nausea. Your lab work was reassuring. Your imaging was reassuring. Your discharge medications include zofran.  This is a nausea medicine.  Take it as needed in order to eat and drink  normal diet Home care instructions are as follows:  Avoid smoking.  Specifically avoid marijuana.  Avoid any other recreational drugs such as cocaine. Follow up with: Your primary care provider as soon as possible for reevaluation.  You should also follow-up with Dr. Blanchie Dessert.  He is an Investment banker, operational.  Call to schedule an appointment for follow-up visit regarding your back pain. Please seek immediate medical care if you develop any of the following symptoms: You are not able to control when you pee or poop. You have severe back pain and: Nausea or vomiting. Pain in your chest or abdomen. Shortness of breath. You faint. At this time there does not appear to be the presence of an emergent medical condition, however there is always the potential for conditions to change. Please read and follow the below instructions.  Do not take your medicine if  develop an itchy rash, swelling in your mouth or lips, or difficulty breathing; call [phone] and seek immediate emergency medical attention if this occurs.  You may review your lab tests and imaging results in their entirety on your MyChart account.  Please discuss all results of fully with your primary care provider and other specialist at your follow-up visit.  Note: Portions of this text may have been transcribed using voice recognition software. Every effort was made to ensure accuracy; however, inadvertent computerized transcription errors may still be present.

## 2023-03-05 NOTE — ED Triage Notes (Signed)
Pt ambulatory to room 19 with c/o bilateral lower back pain that radiates around to abdomen.  Pt states pain started yesterday AM.  Pt also reports n/v that started this AM.  Pt denies urinary symptoms.

## 2023-03-05 NOTE — ED Provider Notes (Signed)
EMERGENCY DEPARTMENT AT [facility] Provider Note   CSN: [id number] Arrival date & time: 03/05/23  7829     History  Chief Complaint  Patient presents with   Back Pain   Abdominal Pain   Emesis    Chelsey Sullivan is a 37 y.o. female.  With a history of epilepsy, polysubstance abuse, anxiety, depression, nonspecific cancer markers presents to the ED for evaluation of low back pain.  This radiates to the bilateral lower quadrants of the abdomen.  This began yesterday.  She developed some nausea and vomiting this morning.  She has had numerous episodes of vomiting.  She has not taken anything for her symptoms prior to arrival.  She believes her back pain is secondary to her cancer.  She denies any fevers or chills.  No urinary complaints.  No diarrhea or constipation.  No chest pain or shortness of breath.  No saddle paresthesias, numbness, weakness, tingling.  She denies smoking, alcohol use, any recreational drug use.   Back Pain Associated symptoms: abdominal pain   Abdominal Pain Associated symptoms: vomiting   Emesis Associated symptoms: abdominal pain        Home Medications Prior to Admission medications   Medication Sig Start Date End Date Taking? Authorizing Provider  ondansetron (ZOFRAN-ODT) 4 MG disintegrating tablet Take 1 tablet (4 mg total) by mouth every 8 (eight) hours as needed for nausea or vomiting. 03/05/23  Yes Milly Goggins, Edsel Petrin, PA-C  albuterol (VENTOLIN HFA) 108 (90 Base) MCG/ACT inhaler Inhale 2 puffs into the lungs every 6 (six) hours as needed for wheezing or shortness of breath. 06/26/19   Shon Hale, MD  HYDROcodone-acetaminophen (NORCO/VICODIN) 5-325 MG tablet Take 1 tablet by mouth every 6 (six) hours as needed. 03/27/22   Doreatha Massed, MD  levETIRAcetam (KEPPRA) 500 MG tablet Take 1 tablet (500 mg total) by mouth 2 (two) times daily. 06/26/19   Shon Hale, MD  morphine (MSIR) 30 MG tablet Take 1 tablet (30 mg total)  by mouth every 8 (eight) hours as needed for severe pain. 03/01/22   Derwood Kaplan, MD  naproxen (NAPROSYN) 500 MG tablet Take 1 tablet (500 mg total) by mouth 2 (two) times daily. 03/01/22   Derwood Kaplan, MD  predniSONE (DELTASONE) 10 MG tablet Take 6 tablets by mouth daily x1 day; then 5 tablet by mouth daily x1 day; then 4 tablets by mouth daily x2 days; then 3 tablets by mouth daily x2 days; then 2 tablets by mouth daily x3 days; then 1 tablet by mouth daily x3 days and stop prednisone. Patient taking differently: Take 10 mg by mouth daily. 01/04/22   Vassie Loll, MD  traMADol (ULTRAM) 50 MG tablet Take 1 tablet (50 mg total) by mouth every 12 (twelve) hours as needed. 02/06/22   Doreatha Massed, MD  ferrous sulfate 325 (65 FE) MG tablet Take 1 tablet (325 mg total) by mouth 2 (two) times daily with a meal. Patient not taking: Reported on 08/04/2019 06/26/19 10/15/19  Shon Hale, MD      Allergies    Morphine, Nsaids, Tramadol, Amoxicillin, and Penicillins    Review of Systems   Review of Systems  Gastrointestinal:  Positive for abdominal pain and vomiting.  Musculoskeletal:  Positive for back pain.  All other systems reviewed and are negative.   Physical Exam Updated Vital Signs BP 137/85   Pulse (!) 57   Temp 98 F (36.7 C) (Oral)   Resp 18   Ht 5\' 6"  (1.676  m)   Wt 85.7 kg   SpO2 95%   BMI 30.51 kg/m  Physical Exam Vitals and nursing note reviewed.  Constitutional:      General: She is in acute distress.     Appearance: She is obese.  HENT:     Head: Normocephalic and atraumatic.  Eyes:     Conjunctiva/sclera: Conjunctivae normal.  Cardiovascular:     Rate and Rhythm: Regular rhythm. Tachycardia present.     Heart sounds: No murmur heard. Pulmonary:     Effort: Pulmonary effort is normal. No respiratory distress.     Breath sounds: Normal breath sounds. No wheezing, rhonchi or rales.  Abdominal:     Palpations: Abdomen is soft.     Tenderness: There  is no abdominal tenderness. There is no right CVA tenderness, left CVA tenderness or guarding.  Musculoskeletal:        General: No swelling.     Cervical back: Neck supple.     Comments: Generalized TTP of the low back worse with flexion and extension  Skin:    General: Skin is warm and dry.     Capillary Refill: Capillary refill takes less than 2 seconds.  Neurological:     Mental Status: She is alert.  Psychiatric:        Mood and Affect: Mood normal.     ED Results / Procedures / Treatments   Labs (all labs ordered are listed, but only abnormal results are displayed) Labs Reviewed  CBC WITH DIFFERENTIAL/PLATELET - Abnormal; Notable for the following components:      Result Value   RDW 16.5 (*)    All other components within normal limits  COMPREHENSIVE METABOLIC PANEL - Abnormal; Notable for the following components:   Glucose, Bld 107 (*)    Total Protein 8.2 (*)    All other components within normal limits  LIPASE, BLOOD  HCG, QUANTITATIVE, PREGNANCY  URINALYSIS, ROUTINE W REFLEX MICROSCOPIC  PREGNANCY, URINE    EKG None  Radiology CT L-SPINE NO CHARGE  Result Date: 03/05/2023 CLINICAL DATA:  Bilateral lower back pain radiating around the abdomen. EXAM: CT LUMBAR SPINE WITHOUT CONTRAST TECHNIQUE: Multidetector CT imaging of the lumbar spine was performed without intravenous contrast administration. Multiplanar CT image reconstructions were also generated. RADIATION DOSE REDUCTION: This exam was performed according to the departmental dose-optimization program which includes automated exposure control, adjustment of the mA and/or kV according to patient size and/or use of iterative reconstruction technique. COMPARISON:  CT abdomen/pelvis 03/01/2022 FINDINGS: Segmentation: Standard; the lowest formed disc space is designated L5-S1. Alignment: Normal. Vertebrae: Vertebral body heights are preserved. There is no suspicious osseous lesion. Paraspinal and other soft tissues:  Paraspinal soft tissues are unremarkable. The abdominal and pelvic viscera are assessed on the separately dictated CT abdomen/pelvis. Disc levels: T12-L1: No significant spinal canal or neural foraminal stenosis L1-L2: No significant spinal canal or neural foraminal stenosis L2-L3: No significant spinal canal or neural foraminal stenosis L3-L4: There is a minimal disc bulge and mild facet arthropathy without significant spinal canal or neural foraminal stenosis L4-L5: There is a minimal disc bulge and mild right worse than left facet arthropathy without significant spinal canal or neural foraminal stenosis L5-S1: There is a minimal disc bulge and mild bilateral facet arthropathy without significant spinal canal or neural foraminal stenosis. IMPRESSION: Minimal degenerative change of the lumbar spine as above without significant spinal canal or neural foraminal stenosis. No acute finding. Electronically Signed   By: Selena Lesser.D.  On: 03/05/2023 14:32   CT ABDOMEN PELVIS W CONTRAST  Result Date: 03/05/2023 CLINICAL DATA:  Bilateral lower back pain radiating around to the abdomen. EXAM: CT ABDOMEN AND PELVIS WITH CONTRAST TECHNIQUE: Multidetector CT imaging of the abdomen and pelvis was performed using the standard protocol following bolus administration of intravenous contrast. RADIATION DOSE REDUCTION: This exam was performed according to the departmental dose-optimization program which includes automated exposure control, adjustment of the mA and/or kV according to patient size and/or use of iterative reconstruction technique. CONTRAST:  OMNIPAQUE IOHEXOL 300 MG/ML  SOLN COMPARISON:  CT abdomen/pelvis 03/01/2022 FINDINGS: Lower chest: Cavitary pulmonary nodules/masses seen on the prior study from 03/01/2022 are significantly decreased in size. There is residual ground-glass nodule measuring 1.2 cm in the medial right base which appears platelike on the coronal reformats (5-66, 2-11). Hepatobiliary:  The liver and gallbladder are unremarkable. There is no biliary ductal dilatation. Pancreas: Unremarkable. Spleen: Unremarkable. Adrenals/Urinary Tract: The adrenals are unremarkable. The cyst in the right kidney is unchanged, requiring no specific imaging follow-up. There are no other focal lesions. There are no stones in either kidney or along the course of either ureter. There is no hydronephrosis or hydroureter. The bladder is unremarkable. Stomach/Bowel: The stomach is unremarkable. There is no evidence of bowel obstruction. There is no abnormal bowel wall thickening or inflammatory change. The appendix is normal. Vascular/Lymphatic: The abdominal aorta is normal in course and caliber. The major branch vessels are patent. The main portal and splenic veins are patent. Previously seen bulky adenopathy in the upper abdomen and pelvis has resolved. Reproductive: The uterus and adnexa are unremarkable. Other: There is no ascites or free air. Musculoskeletal: There is no acute finding in the pelvis. The lumbar spine is assessed on the separately dictated lumbar spine CT. IMPRESSION: 1. No acute findings in the abdomen or pelvis. 2. Compared to the study from 03/01/2022, the cavitary pulmonary nodules/masses in the lung bases have essentially resolved, and abdominal and pelvic lymphadenopathy has resolved. 3. The lumbar spine is reported separately. Electronically Signed   By: Lesia Hausen M.D.   On: 03/05/2023 14:28    Procedures Procedures    Medications Ordered in ED Medications  lidocaine (LIDODERM) 5 % 1 patch (1 patch Transdermal Patch Applied 03/05/23 1456)  sodium chloride 0.9 % bolus 1,000 mL (0 mLs Intravenous Stopped 03/05/23 1342)  fentaNYL (SUBLIMAZE) injection 25 mcg (25 mcg Intravenous Given 03/05/23 1139)  droperidol (INAPSINE) 2.5 MG/ML injection 1.25 mg (1.25 mg Intravenous Given 03/05/23 1139)  iohexol (OMNIPAQUE) 300 MG/ML solution 100 mL (100 mLs Intravenous Contrast Given 03/05/23 1349)   ketorolac (TORADOL) 30 MG/ML injection 15 mg (15 mg Intravenous Given 03/05/23 1456)    ED Course/ Medical Decision Making/ A&P Clinical Course as of 03/05/23 1538  Fri Mar 05, 2023  1244 Nausea and vomiting have resolved.  Still having back pain. [AS]    Clinical Course User Index [AS] [place], Edsel Petrin, PA-C                             Medical Decision Making Amount and/or Complexity of Data Reviewed Labs: ordered. Radiology: ordered.  Risk Prescription drug management.  This patient presents to the ED for concern of low back pain, abdominal pain, nausea and vomiting, this involves an extensive number of treatment options, and is a complaint that carries with it a high risk of complications and morbidity.   The emergent differential diagnosis for back  pain includes but is not limited to fracture, muscle strain, cauda equina, spinal stenosis. DDD, ankylosing spondylitis, acute ligamentous injury, disk herniation, spondylolisthesis, Epidural compression syndrome, metastatic cancer, transverse myelitis, vertebral osteomyelitis, diskitis, kidney stone, pyelonephritis, AAA, Perforated ulcer, Retrocecal appendicitis, pancreatitis, bowel obstruction, retroperitoneal hemorrhage or mass, meningitis.   The emergent differential diagnosis for vomiting includes, but is not limited to ACS/MI, DKA, Ischemic bowel, Meningitis, Sepsis, Acute gastric dilation, Adrenal insufficiency, Appendicitis,  Bowel obstruction/ileus, Carbon monoxide poisoning, Cholecystitis, Electrolyte abnormalities, Elevated ICP, Gastric outlet obstruction, Pancreatitis, Ruptured viscus, Biliary colic, Cannabinoid hyperemesis syndrome, Gastritis, Gastroenteritis, Gastroparesis,  Narcotic withdrawal, Peptic ulcer disease, and UTI   Co morbidities that complicate the patient evaluation  epilepsy, polysubstance abuse, anxiety, depression, nonspecific cancer markers  My initial workup includes labs, imaging, symptom  control  Additional history obtained from: Nursing notes from this visit. Previous records within EMR system ED visit on 12/01/2022 for cannabis hyperemesis.  Oncology office visit on 04/28/2022.  Negative PET scan of the lumbar spine, chronic low back pain thought to be secondary to degenerative disc disease.  I ordered, reviewed and interpreted labs which include: CBC, CMP, lipase, urinalysis, urine pregnancy  I ordered imaging studies including CT abdomen pelvis, CT L-spine no charge I independently visualized and interpreted imaging which showed no acute findings in the abdomen and pelvis, cavitary pulmonary nodules have essentially resolved and abdominal and pelvic lymphadenopathy has also resolved.  Mild degenerative changes of the L-spine with no significant acute abnormalities I agree with the radiologist interpretation  Afebrile, hemodynamically stable.  37 year old female presenting to the ED for evaluation of low back pain and lower abdominal pain as well as nausea and vomiting.  Patient denies cannabis use, however chart review shows that patient was seen in the [facility] ED 3 months ago for cannabis hyperemesis syndrome and low back pain.  Multiple office visits also noticed cannabis use as well as cocaine use.  Overall I have a high clinical suspicion for cannabis hyperemesis as the cause of her nausea and vomiting.  She reported full resolution of her nausea after droperidol in the ED.  Her abdomen is soft and nontender.  CT abdomen pelvis was overall reassuring.  CT lumbar spine was reassuring as well.  She also had a PET scan that was negative for bone lesions in the lumbar spine.  Unclear etiology of her low back pain, however this is chronic in nature and she otherwise denies red flag symptoms of low back pain.  Low suspicion for cauda equina syndrome today.  She reported significant improvement after Toradol and lidocaine patch in the ED.  Patient was educated on appropriate use of  Tylenol and ibuprofen at home for pain as well as lidocaine patches.  She passed her p.o. challenge in the ED without difficulty.  She was encouraged to follow-up with her primary care provider within the next week for reevaluation of her symptoms.  She was given return precautions.  Stable at discharge.  At this time there does not appear to be any evidence of an acute emergency medical condition and the patient appears stable for discharge with appropriate outpatient follow up. Diagnosis was discussed with patient who verbalizes understanding of care plan and is agreeable to discharge. I have discussed return precautions with patient who verbalizes understanding. Patient encouraged to follow-up with their PCP within 1 week. All questions answered.  Note: Portions of this report may have been transcribed using voice recognition software. Every effort was made to ensure accuracy; however, inadvertent  computerized transcription errors may still be present.        Final Clinical Impression(s) / ED Diagnoses Final diagnoses:  Nausea and vomiting, unspecified vomiting type  Chronic bilateral low back pain without sciatica    Rx / DC Orders ED Discharge Orders          Ordered    ondansetron (ZOFRAN-ODT) 4 MG disintegrating tablet  Every 8 hours PRN        03/05/23 1537              Mora Bellman 03/05/23 1538    Rondel Baton, MD 03/10/23 1252

## 2024-02-22 DEATH — deceased
